# Patient Record
Sex: Female | Born: 1955 | Race: Black or African American | Hispanic: No | Marital: Single | Smoking: Never smoker
Health system: Southern US, Community
[De-identification: ages and names within clinical notes are randomized; demographics above are authoritative.]

## PROBLEM LIST (undated history)

## (undated) DIAGNOSIS — I10 Essential (primary) hypertension: Secondary | ICD-10-CM

## (undated) DIAGNOSIS — E119 Type 2 diabetes mellitus without complications: Secondary | ICD-10-CM

## (undated) HISTORY — PX: ABDOMINAL HYSTERECTOMY: SHX81

---

## 2014-02-24 ENCOUNTER — Encounter (HOSPITAL_COMMUNITY): Payer: Self-pay

## 2014-02-24 ENCOUNTER — Emergency Department (HOSPITAL_COMMUNITY): Payer: Self-pay

## 2014-02-24 ENCOUNTER — Emergency Department (HOSPITAL_COMMUNITY): Payer: MEDICAID

## 2014-02-24 ENCOUNTER — Inpatient Hospital Stay (HOSPITAL_COMMUNITY)
Admission: EM | Admit: 2014-02-24 | Discharge: 2014-03-15 | DRG: 870 | Disposition: A | Discharge status: Home / Self Care | Payer: Self-pay | Attending: Internal Medicine | Admitting: Internal Medicine

## 2014-02-24 ENCOUNTER — Inpatient Hospital Stay (HOSPITAL_COMMUNITY): Payer: MEDICAID

## 2014-02-24 DIAGNOSIS — G9340 Encephalopathy, unspecified: Secondary | ICD-10-CM | POA: Diagnosis present

## 2014-02-24 DIAGNOSIS — E1110 Type 2 diabetes mellitus with ketoacidosis without coma: Secondary | ICD-10-CM | POA: Diagnosis present

## 2014-02-24 DIAGNOSIS — D72829 Elevated white blood cell count, unspecified: Secondary | ICD-10-CM | POA: Clinically undetermined

## 2014-02-24 DIAGNOSIS — J189 Pneumonia, unspecified organism: Secondary | ICD-10-CM | POA: Insufficient documentation

## 2014-02-24 DIAGNOSIS — N184 Chronic kidney disease, stage 4 (severe): Secondary | ICD-10-CM | POA: Insufficient documentation

## 2014-02-24 DIAGNOSIS — F32A Depression, unspecified: Secondary | ICD-10-CM | POA: Clinically undetermined

## 2014-02-24 DIAGNOSIS — N170 Acute kidney failure with tubular necrosis: Secondary | ICD-10-CM | POA: Diagnosis present

## 2014-02-24 DIAGNOSIS — E876 Hypokalemia: Secondary | ICD-10-CM | POA: Insufficient documentation

## 2014-02-24 DIAGNOSIS — Z95828 Presence of other vascular implants and grafts: Secondary | ICD-10-CM

## 2014-02-24 DIAGNOSIS — R4182 Altered mental status, unspecified: Secondary | ICD-10-CM | POA: Diagnosis present

## 2014-02-24 DIAGNOSIS — E0811 Diabetes mellitus due to underlying condition with ketoacidosis with coma: Secondary | ICD-10-CM | POA: Insufficient documentation

## 2014-02-24 DIAGNOSIS — Z01818 Encounter for other preprocedural examination: Secondary | ICD-10-CM

## 2014-02-24 DIAGNOSIS — F329 Major depressive disorder, single episode, unspecified: Secondary | ICD-10-CM | POA: Diagnosis not present

## 2014-02-24 DIAGNOSIS — G7281 Critical illness myopathy: Secondary | ICD-10-CM | POA: Diagnosis not present

## 2014-02-24 DIAGNOSIS — Z87891 Personal history of nicotine dependence: Secondary | ICD-10-CM

## 2014-02-24 DIAGNOSIS — R0989 Other specified symptoms and signs involving the circulatory and respiratory systems: Secondary | ICD-10-CM

## 2014-02-24 DIAGNOSIS — J9601 Acute respiratory failure with hypoxia: Secondary | ICD-10-CM | POA: Diagnosis present

## 2014-02-24 DIAGNOSIS — A419 Sepsis, unspecified organism: Principal | ICD-10-CM | POA: Diagnosis present

## 2014-02-24 DIAGNOSIS — E860 Dehydration: Secondary | ICD-10-CM | POA: Diagnosis present

## 2014-02-24 DIAGNOSIS — N2581 Secondary hyperparathyroidism of renal origin: Secondary | ICD-10-CM | POA: Diagnosis present

## 2014-02-24 DIAGNOSIS — E872 Acidosis, unspecified: Secondary | ICD-10-CM | POA: Diagnosis present

## 2014-02-24 DIAGNOSIS — N179 Acute kidney failure, unspecified: Secondary | ICD-10-CM | POA: Diagnosis present

## 2014-02-24 DIAGNOSIS — E43 Unspecified severe protein-calorie malnutrition: Secondary | ICD-10-CM | POA: Diagnosis present

## 2014-02-24 DIAGNOSIS — J13 Pneumonia due to Streptococcus pneumoniae: Secondary | ICD-10-CM | POA: Diagnosis present

## 2014-02-24 DIAGNOSIS — I129 Hypertensive chronic kidney disease with stage 1 through stage 4 chronic kidney disease, or unspecified chronic kidney disease: Secondary | ICD-10-CM | POA: Diagnosis present

## 2014-02-24 DIAGNOSIS — R6520 Severe sepsis without septic shock: Secondary | ICD-10-CM | POA: Diagnosis present

## 2014-02-24 DIAGNOSIS — D6959 Other secondary thrombocytopenia: Secondary | ICD-10-CM | POA: Diagnosis present

## 2014-02-24 DIAGNOSIS — J9690 Respiratory failure, unspecified, unspecified whether with hypoxia or hypercapnia: Secondary | ICD-10-CM

## 2014-02-24 DIAGNOSIS — N390 Urinary tract infection, site not specified: Secondary | ICD-10-CM | POA: Clinically undetermined

## 2014-02-24 DIAGNOSIS — E669 Obesity, unspecified: Secondary | ICD-10-CM | POA: Diagnosis present

## 2014-02-24 DIAGNOSIS — J690 Pneumonitis due to inhalation of food and vomit: Secondary | ICD-10-CM | POA: Diagnosis present

## 2014-02-24 DIAGNOSIS — E1122 Type 2 diabetes mellitus with diabetic chronic kidney disease: Secondary | ICD-10-CM | POA: Diagnosis present

## 2014-02-24 DIAGNOSIS — R197 Diarrhea, unspecified: Secondary | ICD-10-CM | POA: Diagnosis not present

## 2014-02-24 DIAGNOSIS — J9600 Acute respiratory failure, unspecified whether with hypoxia or hypercapnia: Secondary | ICD-10-CM | POA: Diagnosis present

## 2014-02-24 DIAGNOSIS — T68XXXA Hypothermia, initial encounter: Secondary | ICD-10-CM

## 2014-02-24 DIAGNOSIS — E1310 Other specified diabetes mellitus with ketoacidosis without coma: Secondary | ICD-10-CM | POA: Diagnosis present

## 2014-02-24 DIAGNOSIS — D509 Iron deficiency anemia, unspecified: Secondary | ICD-10-CM | POA: Diagnosis present

## 2014-02-24 HISTORY — DX: Type 2 diabetes mellitus without complications: E11.9

## 2014-02-24 HISTORY — DX: Essential (primary) hypertension: I10

## 2014-02-24 LAB — URINALYSIS, ROUTINE W REFLEX MICROSCOPIC
Glucose, UA: >1000 mg/dL — AB
Leukocytes, UA: NEGATIVE
Nitrite: NEGATIVE
Protein, ur: 100 mg/dL — AB
Specific Gravity, Urine: 1.02 (ref 1.005–1.030)
Urobilinogen, UA: 0.2 mg/dL (ref 0.0–1.0)
pH: 5.5 (ref 5.0–8.0)

## 2014-02-24 LAB — BASIC METABOLIC PANEL
ANION GAP: 29 — AB (ref 5–15)
Anion gap: 13 (ref 5–15)
BUN: 37 mg/dL — ABNORMAL HIGH (ref 6–23)
BUN: 35 mg/dL — AB (ref 6–23)
CHLORIDE: 99 meq/L (ref 96–112)
CHLORIDE: 114 meq/L — AB (ref 96–112)
CO2: 7 mmol/L — AB (ref 19–32)
CO2: 15 mmol/L — AB (ref 19–32)
Calcium: 10.8 mg/dL — ABNORMAL HIGH (ref 8.4–10.5)
Calcium: 9.1 mg/dL (ref 8.4–10.5)
Creatinine, Ser: 1.83 mg/dL — ABNORMAL HIGH (ref 0.50–1.10)
Creatinine, Ser: 1.38 mg/dL — ABNORMAL HIGH (ref 0.50–1.10)
GFR calc Af Amer: 34 mL/min — ABNORMAL LOW (ref >90)
GFR calc non Af Amer: 29 mL/min — ABNORMAL LOW (ref >90)
GFR, EST AFRICAN AMERICAN: 48 mL/min — AB (ref >90)
GFR, EST NON AFRICAN AMERICAN: 41 mL/min — AB (ref >90)
Glucose, Bld: 549 mg/dL — ABNORMAL HIGH (ref 70–99)
Glucose, Bld: 273 mg/dL — ABNORMAL HIGH (ref 70–99)
POTASSIUM: 3.7 mmol/L (ref 3.5–5.1)
Potassium: 3.1 mmol/L — ABNORMAL LOW (ref 3.5–5.1)
SODIUM: 135 mmol/L (ref 135–145)
SODIUM: 142 mmol/L (ref 135–145)

## 2014-02-24 LAB — CBG MONITORING, ED
Glucose-Capillary: 437 mg/dL — ABNORMAL HIGH (ref 70–99)
Glucose-Capillary: 476 mg/dL — ABNORMAL HIGH (ref 70–99)

## 2014-02-24 LAB — GLUCOSE, CAPILLARY
GLUCOSE-CAPILLARY: 365 mg/dL — AB (ref 70–99)
Glucose-Capillary: 395 mg/dL — ABNORMAL HIGH (ref 70–99)
Glucose-Capillary: 248 mg/dL — ABNORMAL HIGH (ref 70–99)
Glucose-Capillary: 229 mg/dL — ABNORMAL HIGH (ref 70–99)
Glucose-Capillary: 239 mg/dL — ABNORMAL HIGH (ref 70–99)
Glucose-Capillary: 224 mg/dL — ABNORMAL HIGH (ref 70–99)
Glucose-Capillary: 223 mg/dL — ABNORMAL HIGH (ref 70–99)

## 2014-02-24 LAB — CBC WITH DIFFERENTIAL/PLATELET
Basophils Absolute: 0.1 10*3/uL (ref 0.0–0.1)
Basophils Relative: 0 % (ref 0–1)
EOS ABS: 0 10*3/uL (ref 0.0–0.7)
Eosinophils Relative: 0 % (ref 0–5)
HCT: 36.7 % (ref 36.0–46.0)
Hemoglobin: 11.8 g/dL — ABNORMAL LOW (ref 12.0–15.0)
LYMPHS ABS: 0.8 10*3/uL (ref 0.7–4.0)
LYMPHS PCT: 2 % — AB (ref 12–46)
MCH: 31 pg (ref 26.0–34.0)
MCHC: 32.2 g/dL (ref 30.0–36.0)
MCV: 96.3 fL (ref 78.0–100.0)
MONO ABS: 1 10*3/uL (ref 0.1–1.0)
Monocytes Relative: 3 % (ref 3–12)
NEUTROS PCT: 94 % — AB (ref 43–77)
Neutro Abs: 31 10*3/uL — ABNORMAL HIGH (ref 1.7–7.7)
Platelets: 323 10*3/uL (ref 150–400)
RBC: 3.81 MIL/uL — ABNORMAL LOW (ref 3.87–5.11)
RDW: 14.3 % (ref 11.5–15.5)
WBC: 32.9 10*3/uL — ABNORMAL HIGH (ref 4.0–10.5)

## 2014-02-24 LAB — ETHANOL: Alcohol, Ethyl (B): <5 mg/dL (ref 0–9)

## 2014-02-24 LAB — BLOOD GAS, ARTERIAL
Acid-base deficit: 22.5 mmol/L — ABNORMAL HIGH (ref 0.0–2.0)
BICARBONATE: 6.4 meq/L — AB (ref 20.0–24.0)
FIO2: 110 %
MECHVT: 450 mL
O2 Saturation: 93.9 %
PEEP/CPAP: 5 cmH2O
PO2 ART: 89.7 mmHg (ref 80.0–100.0)
Patient temperature: 37
RATE: 15 resp/min
TCO2: 6.6 mmol/L (ref 0–100)
pCO2 arterial: 27 mmHg — ABNORMAL LOW (ref 35.0–45.0)
pH, Arterial: 7.007 — CL (ref 7.350–7.450)

## 2014-02-24 LAB — CBC
HCT: 28.8 % — ABNORMAL LOW (ref 36.0–46.0)
Hemoglobin: 9.6 g/dL — ABNORMAL LOW (ref 12.0–15.0)
MCH: 30.7 pg (ref 26.0–34.0)
MCHC: 33.3 g/dL (ref 30.0–36.0)
MCV: 92 fL (ref 78.0–100.0)
Platelets: 268 10*3/uL (ref 150–400)
RBC: 3.13 MIL/uL — ABNORMAL LOW (ref 3.87–5.11)
RDW: 14.1 % (ref 11.5–15.5)
WBC: 23.3 10*3/uL — ABNORMAL HIGH (ref 4.0–10.5)

## 2014-02-24 LAB — RAPID URINE DRUG SCREEN, HOSP PERFORMED
AMPHETAMINES: NOT DETECTED
BENZODIAZEPINES: NOT DETECTED
Barbiturates: NOT DETECTED
COCAINE: NOT DETECTED
Opiates: NOT DETECTED
Tetrahydrocannabinol: POSITIVE — AB

## 2014-02-24 LAB — URINE MICROSCOPIC-ADD ON

## 2014-02-24 LAB — STREP PNEUMONIAE URINARY ANTIGEN: Strep Pneumo Urinary Antigen: NEGATIVE

## 2014-02-24 LAB — CK: CK TOTAL: 102 U/L (ref 7–177)

## 2014-02-24 LAB — LACTIC ACID, PLASMA: Lactic Acid, Venous: 1.8 mmol/L (ref 0.5–2.2)

## 2014-02-24 LAB — INFLUENZA PANEL BY PCR (TYPE A & B)
H1N1 flu by pcr: NOT DETECTED
INFLAPCR: NEGATIVE
INFLBPCR: NEGATIVE

## 2014-02-24 LAB — MRSA PCR SCREENING: MRSA by PCR: NEGATIVE

## 2014-02-24 LAB — KETONES, QUALITATIVE

## 2014-02-24 LAB — TROPONIN I

## 2014-02-24 MED ORDER — CETYLPYRIDINIUM CHLORIDE 0.05 % MT LIQD
7.0000 mL | Freq: Four times a day (QID) | OROMUCOSAL | Status: DC
  Administered 2014-02-25 – 2014-03-14 (×50): 7 mL via OROMUCOSAL

## 2014-02-24 MED ORDER — ETOMIDATE 2 MG/ML IV SOLN
15.0000 mg | Freq: Once | INTRAVENOUS | Status: AC
  Administered 2014-02-24: 15 mg via INTRAVENOUS

## 2014-02-24 MED ORDER — SODIUM CHLORIDE 0.9 % IJ SOLN
10.0000 mL | INTRAMUSCULAR | Status: DC | PRN
  Administered 2014-03-01 – 2014-03-13 (×8): 10 mL
  Filled 2014-02-24 (×8): qty 40

## 2014-02-24 MED ORDER — CEFTRIAXONE SODIUM 1 G IJ SOLR
INTRAMUSCULAR | Status: AC
  Filled 2014-02-24: qty 10

## 2014-02-24 MED ORDER — SODIUM CHLORIDE 0.9 % IV SOLN
INTRAVENOUS | Status: DC
  Administered 2014-02-24: 18:00:00 via INTRAVENOUS

## 2014-02-24 MED ORDER — FENTANYL CITRATE 0.05 MG/ML IJ SOLN: 100.0000 ug | INTRAMUSCULAR | Status: DC | PRN

## 2014-02-24 MED ORDER — ONDANSETRON HCL 4 MG/2ML IJ SOLN: 4.0000 mg | Freq: Four times a day (QID) | INTRAMUSCULAR | Status: DC | PRN

## 2014-02-24 MED ORDER — SODIUM CHLORIDE 0.9 % IV SOLN
25.0000 ug/h | INTRAVENOUS | Status: DC
  Administered 2014-02-24 – 2014-02-25 (×2): 25 ug/h via INTRAVENOUS
  Administered 2014-02-25: 200 ug/h via INTRAVENOUS
  Administered 2014-02-26 – 2014-02-27 (×4): 250 ug/h via INTRAVENOUS
  Administered 2014-02-28: 200 ug/h via INTRAVENOUS
  Filled 2014-02-24 (×7): qty 50

## 2014-02-24 MED ORDER — SODIUM CHLORIDE 0.9 % IV SOLN: INTRAVENOUS | Status: AC

## 2014-02-24 MED ORDER — ETOMIDATE 2 MG/ML IV SOLN
INTRAVENOUS | Status: AC
  Filled 2014-02-24: qty 20

## 2014-02-24 MED ORDER — ARTIFICIAL TEARS OP OINT
TOPICAL_OINTMENT | OPHTHALMIC | Status: DC | PRN
  Administered 2014-02-25: via OPHTHALMIC
  Administered 2014-02-26: 1 via OPHTHALMIC
  Filled 2014-02-24: qty 3.5

## 2014-02-24 MED ORDER — SODIUM CHLORIDE 0.9 % IJ SOLN
10.0000 mL | Freq: Two times a day (BID) | INTRAMUSCULAR | Status: DC
  Administered 2014-02-24 – 2014-02-26 (×4): 10 mL
  Administered 2014-02-27: 40 mL
  Administered 2014-02-28: 10 mL
  Administered 2014-03-01: 30 mL
  Administered 2014-03-02: 10 mL

## 2014-02-24 MED ORDER — SUCCINYLCHOLINE CHLORIDE 20 MG/ML IJ SOLN
100.0000 mg | Freq: Once | INTRAMUSCULAR | Status: AC
  Administered 2014-02-24: 100 mg via INTRAVENOUS

## 2014-02-24 MED ORDER — SUCCINYLCHOLINE CHLORIDE 20 MG/ML IJ SOLN
INTRAMUSCULAR | Status: AC
  Filled 2014-02-24: qty 1

## 2014-02-24 MED ORDER — ASPIRIN 300 MG RE SUPP
300.0000 mg | RECTAL | Status: AC
  Administered 2014-02-24: 300 mg via RECTAL
  Filled 2014-02-24: qty 1

## 2014-02-24 MED ORDER — SODIUM CHLORIDE 0.9 % IV SOLN: 250.0000 mL | INTRAVENOUS | Status: DC | PRN

## 2014-02-24 MED ORDER — PROPOFOL 10 MG/ML IV EMUL
5.0000 ug/kg/min | INTRAVENOUS | Status: DC
  Administered 2014-02-24: 5 ug/kg/min via INTRAVENOUS
  Filled 2014-02-24: qty 100

## 2014-02-24 MED ORDER — DEXTROSE-NACL 5-0.45 % IV SOLN: INTRAVENOUS | Status: DC

## 2014-02-24 MED ORDER — SODIUM CHLORIDE 0.9 % IV BOLUS (SEPSIS)
1000.0000 mL | Freq: Once | INTRAVENOUS | Status: AC
  Administered 2014-02-24: 1000 mL via INTRAVENOUS

## 2014-02-24 MED ORDER — VANCOMYCIN HCL IN DEXTROSE 1-5 GM/200ML-% IV SOLN
1000.0000 mg | INTRAVENOUS | Status: DC
  Administered 2014-02-24 – 2014-02-25 (×2): 1000 mg via INTRAVENOUS
  Filled 2014-02-24 (×3): qty 200

## 2014-02-24 MED ORDER — ALBUTEROL SULFATE (2.5 MG/3ML) 0.083% IN NEBU: 2.5000 mg | INHALATION_SOLUTION | RESPIRATORY_TRACT | Status: DC | PRN

## 2014-02-24 MED ORDER — INSULIN REGULAR HUMAN 100 UNIT/ML IJ SOLN
INTRAMUSCULAR | Status: DC
  Filled 2014-02-24 (×2): qty 2.5

## 2014-02-24 MED ORDER — ALBUTEROL SULFATE (2.5 MG/3ML) 0.083% IN NEBU: 2.5000 mg | INHALATION_SOLUTION | RESPIRATORY_TRACT | Status: DC

## 2014-02-24 MED ORDER — DEXTROSE 5 % IV SOLN
500.0000 mg | Freq: Once | INTRAVENOUS | Status: AC
  Administered 2014-02-24: 500 mg via INTRAVENOUS
  Filled 2014-02-24: qty 500

## 2014-02-24 MED ORDER — IPRATROPIUM BROMIDE 0.02 % IN SOLN: 0.5000 mg | RESPIRATORY_TRACT | Status: DC

## 2014-02-24 MED ORDER — IPRATROPIUM-ALBUTEROL 0.5-2.5 (3) MG/3ML IN SOLN
3.0000 mL | RESPIRATORY_TRACT | Status: DC
  Administered 2014-02-24 – 2014-02-27 (×18): 3 mL via RESPIRATORY_TRACT
  Filled 2014-02-24 (×18): qty 3

## 2014-02-24 MED ORDER — PIPERACILLIN-TAZOBACTAM 3.375 G IVPB
3.3750 g | Freq: Three times a day (TID) | INTRAVENOUS | Status: DC
  Administered 2014-02-24 – 2014-02-26 (×8): 3.375 g via INTRAVENOUS
  Filled 2014-02-24 (×9): qty 50

## 2014-02-24 MED ORDER — PIPERACILLIN-TAZOBACTAM 3.375 G IVPB
INTRAVENOUS | Status: AC
  Filled 2014-02-24: qty 100

## 2014-02-24 MED ORDER — VANCOMYCIN HCL IN DEXTROSE 1-5 GM/200ML-% IV SOLN
INTRAVENOUS | Status: AC
  Filled 2014-02-24: qty 200

## 2014-02-24 MED ORDER — ACETAMINOPHEN 325 MG PO TABS: 650.0000 mg | ORAL_TABLET | ORAL | Status: DC | PRN

## 2014-02-24 MED ORDER — ROCURONIUM BROMIDE 50 MG/5ML IV SOLN
INTRAVENOUS | Status: AC
  Filled 2014-02-24: qty 2

## 2014-02-24 MED ORDER — LIDOCAINE HCL (CARDIAC) 20 MG/ML IV SOLN
INTRAVENOUS | Status: AC
  Filled 2014-02-24: qty 5

## 2014-02-24 MED ORDER — PANTOPRAZOLE SODIUM 40 MG IV SOLR
40.0000 mg | Freq: Every day | INTRAVENOUS | Status: DC
  Administered 2014-02-24 – 2014-02-26 (×3): 40 mg via INTRAVENOUS
  Filled 2014-02-24 (×3): qty 40

## 2014-02-24 MED ORDER — FENTANYL CITRATE 0.05 MG/ML IJ SOLN
100.0000 ug | INTRAMUSCULAR | Status: AC | PRN
  Administered 2014-02-24 (×3): 100 ug via INTRAVENOUS
  Filled 2014-02-24 (×4): qty 2

## 2014-02-24 MED ORDER — SODIUM CHLORIDE 0.9 % IV SOLN
INTRAVENOUS | Status: DC
  Administered 2014-02-24: 3.8 [IU]/h via INTRAVENOUS
  Filled 2014-02-24: qty 2.5

## 2014-02-24 MED ORDER — ARTIFICIAL TEARS OP OINT
TOPICAL_OINTMENT | OPHTHALMIC | Status: AC
  Filled 2014-02-24: qty 3.5

## 2014-02-24 MED ORDER — ASPIRIN 81 MG PO CHEW: 324.0000 mg | CHEWABLE_TABLET | ORAL | Status: AC

## 2014-02-24 MED ORDER — HEPARIN SODIUM (PORCINE) 5000 UNIT/ML IJ SOLN
5000.0000 [IU] | Freq: Three times a day (TID) | INTRAMUSCULAR | Status: DC
  Administered 2014-02-24 – 2014-02-27 (×9): 5000 [IU] via SUBCUTANEOUS
  Filled 2014-02-24 (×9): qty 1

## 2014-02-24 MED ORDER — DEXTROSE 5 % IV SOLN
1.0000 g | Freq: Once | INTRAVENOUS | Status: AC
  Administered 2014-02-24: 1 g via INTRAVENOUS
  Filled 2014-02-24: qty 10

## 2014-02-24 MED ORDER — POTASSIUM CHLORIDE 10 MEQ/100ML IV SOLN
10.0000 meq | INTRAVENOUS | Status: AC
  Administered 2014-02-24 (×2): 10 meq via INTRAVENOUS
  Filled 2014-02-24: qty 100

## 2014-02-24 MED ORDER — CHLORHEXIDINE GLUCONATE 0.12 % MT SOLN
15.0000 mL | Freq: Two times a day (BID) | OROMUCOSAL | Status: DC
  Administered 2014-02-24 – 2014-03-15 (×35): 15 mL via OROMUCOSAL
  Filled 2014-02-24 (×40): qty 15

## 2014-02-24 MED ORDER — DEXTROSE-NACL 5-0.45 % IV SOLN
INTRAVENOUS | Status: DC
  Administered 2014-02-24 – 2014-02-26 (×5): via INTRAVENOUS

## 2014-02-24 NOTE — ED Notes (Signed)
EMS called out for unresponsiveness, found on bedroom floor by brother; last seen normal yesterday, CBG on arrival 390;

## 2014-02-24 NOTE — ED Provider Notes (Addendum)
CSN: [id number]     Arrival date & time 02/24/14  1016 History   First MD Initiated Contact with Patient 02/24/14 1021     Chief Complaint  Patient presents with  . unresponsive      (Consider location/radiation/quality/duration/timing/severity/associated sxs/prior Treatment) HPI....... level V caveat for urgent need for intervention. Patient was found at her home today by her brother on the floor. She was obtunded and acting confused. Brother reports that yesterday she didn't feel well and was coughing. Family claims that she has no previous health history. Patient arrived by EMS. She was totally obtunded on initial exam and I was unable to get history from patient  No past medical history on file. No past surgical history on file. No family history on file. History  Substance Use Topics  . Smoking status: Not on file  . Smokeless tobacco: Not on file  . Alcohol Use: Not on file   OB History    No data available     Review of Systems  Unable to perform ROS: Acuity of condition      Allergies  Review of patient's allergies indicates not on file.  Home Medications   Prior to Admission medications   Not on File   BP 130/81 mmHg  Pulse 101  Temp(Src) 94.2 F (34.6 C) (Core (Comment))  Resp 26  Ht 5\' 6"  (1.676 m)  Wt 135 lb (61.236 kg)  BMI 21.80 kg/m2  SpO2 94%  LMP  Physical Exam  Constitutional: She is oriented to person, place, and time.  Obtunded, dehydrated, crusty phlegm around mouth, slightly tachypneic, acetone on breath  HENT:  Head: Normocephalic and atraumatic.  Eyes: Conjunctivae are normal. Pupils are equal, round, and reactive to light.  Neck: Normal range of motion. Neck supple.  Cardiovascular: Normal rate and regular rhythm.   Pulmonary/Chest: Effort normal and breath sounds normal.  Abdominal: Soft. Bowel sounds are normal.  Genitourinary:  Suspected candida in vulva/vaginal area  Musculoskeletal: Normal range of motion.  Neurological: She  is alert and oriented to person, place, and time.  Skin: Skin is warm and dry.  Psychiatric: She has a normal mood and affect. Her behavior is normal.  Nursing note and vitals reviewed.   ED Course  INTUBATION Date/Time: 02/24/2014 1:05 PM Performed by: Donnetta HutchingOOK, Dianna Ewald Authorized by: Donnetta HutchingOOK, Dian Laprade Comments: Reason for intubation: Hypoxemia, inability to protect airway.   Patient intubated using the RSI technique with etomidate 15 mg and succinylcholine 100 mg. Patient intubated with 7.5 endotracheal tube without complications. Good color change on CO2 monitor. Pulse ox rose to 100%. Chest x-ray pending at time of dictation.   (including critical care time) Labs Review Labs Reviewed  BASIC METABOLIC PANEL - Abnormal; Notable for the following:    CO2 7 (*)    Glucose, Bld 549 (*)    BUN 37 (*)    Creatinine, Ser 1.83 (*)    Calcium 10.8 (*)    GFR calc non Af Amer 29 (*)    GFR calc Af Amer 34 (*)    Anion gap 29 (*)    All other components within normal limits  CBC WITH DIFFERENTIAL - Abnormal; Notable for the following:    WBC 32.9 (*)    RBC 3.81 (*)    Hemoglobin 11.8 (*)    Neutrophils Relative % 94 (*)    Neutro Abs 31.0 (*)    Lymphocytes Relative 2 (*)    All other components within normal limits  URINE RAPID DRUG  SCREEN (HOSP PERFORMED) - Abnormal; Notable for the following:    Tetrahydrocannabinol POSITIVE (*)    All other components within normal limits  URINALYSIS, ROUTINE W REFLEX MICROSCOPIC - Abnormal; Notable for the following:    APPearance CLOUDY (*)    Glucose, UA >1000 (*)    Hgb urine dipstick LARGE (*)    Bilirubin Urine SMALL (*)    Ketones, ur >80 (*)    Protein, ur 100 (*)    All other components within normal limits  URINE MICROSCOPIC-ADD ON - Abnormal; Notable for the following:    Bacteria, UA MANY (*)    Casts GRANULAR CAST (*)    All other components within normal limits  TROPONIN I  ETHANOL  CK  CBG MONITORING, ED    Imaging Review Ct  Head Wo Contrast  02/24/2014   CLINICAL DATA:  Found on bedroom floor unresponsive; ams  EXAM: CT HEAD WITHOUT CONTRAST  TECHNIQUE: Contiguous axial images were obtained from the base of the skull through the vertex without intravenous contrast.  COMPARISON:  None.  FINDINGS: Diffusely enlarged ventricles and subarachnoid spaces. Patchy white matter low density in both cerebral hemispheres. No skull fracture, intracranial hemorrhage, mass lesion, CT evidence of acute infarction or paranasal sinus air-fluid levels.  IMPRESSION: No acute abnormality. Mild diffuse cerebral and cerebellar atrophy and minimal chronic small vessel white matter ischemic changes in both cerebral hemispheres.   Electronically Signed   By: Gordan Payment M.D.   On: 02/24/2014 11:28   Ct Cervical Spine Wo Contrast  02/24/2014   CLINICAL DATA:  Patient found down and unresponsive.  EXAM: CT CERVICAL SPINE WITHOUT CONTRAST  TECHNIQUE: Multidetector CT imaging of the cervical spine was performed without intravenous contrast. Multiplanar CT image reconstructions were also generated.  COMPARISON:  None.  FINDINGS: There is mild reversal of the normal cervical lordosis. No fracture or malalignment is identified. 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 Lung apices are clear. Gas in soft tissue structures in the upper chest is likely related to vascular access.  IMPRESSION: No acute abnormality.  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   Electronically Signed   By: Drusilla Kanner M.D.   On: 02/24/2014 12:56   Dg Chest Port 1 View  02/24/2014   CLINICAL DATA:  Found on bedroom floor unresponsive; ams  EXAM: PORTABLE CHEST - 1 VIEW  COMPARISON:  None.  FINDINGS: Enlarged cardiac silhouette. Extensive patchy opacity in both lungs. Mildly  prominent interstitial markings. No pleural fluid seen. Unremarkable bones.  IMPRESSION: 1. Extensive bilateral airspace opacity, most likely due to pneumonia or aspiration pneumonitis. This does not have the typical appearance of pulmonary edema. Follow-up to resolution is recommended to exclude any underlying masses. 2. Cardiomegaly and probable chronic interstitial lung disease.   Electronically Signed   By: Gordan Payment M.D.   On: 02/24/2014 11:27     EKG Interpretation None     CRITICAL CARE Performed by: Donnetta Hutching  ?  Total critical care time: 75 min  Critical care time was exclusive of separately billable procedures and treating other patients.  Critical care was necessary to treat or prevent imminent or life-threatening deterioration.  Critical care was time spent personally by me on the following activities: development of treatment plan with patient and/or surrogate as well as nursing,  discussions with consultants, evaluation of patient's response to treatment, examination of patient, obtaining history from patient or surrogate, ordering and performing treatments and interventions, ordering and review of laboratory studies, ordering and review of radiographic studies, pulse oximetry and re-evaluation of patient's condition. MDM   Final diagnoses:  Altered mental status  Altered mental state  Hypothermia, initial encounter  Diabetic ketoacidosis with coma associated with diabetes mellitus due to underlying condition  Community acquired pneumonia    Patient is in critical condition. She is hypothermic, in DKA, with evidence of bilat pneumonia on chest x-ray. We initiated aggressive warming via the bear hugger, aggressive IV hydration, glucose stabilizer protocol; IV Rocephin, IV Zithromax for community-acquired pneumonia. Patient was intubated to protect her airway.  Tests and clinical scenario were discussed with family in great detail. Admit to Dr. Audie Clear,  MD 02/24/14 1327  Donnetta Hutching, MD 02/26/14 2145

## 2014-02-24 NOTE — ED Notes (Signed)
CRITICAL VALUE ALERT  Critical value received:  CO2 = 7  Date of notification:  02/24/14  Time of notification:  1124  Critical value read back:Yes.    Nurse who received alert:  S. Pernell Dikes RN  MD notified (1st page):  Dr. Adriana Simasook  Time of first page:  1124  MD notified (2nd page):  Time of second page:  Responding MD:  Dr. Adriana Simasook  Time MD responded:  1124

## 2014-02-24 NOTE — Progress Notes (Addendum)
Pt transferred to unit from ED with pna, DKA, and respiratory failure. Pt is currently intubated and having PICC line placed at bedside. VS are stable. Pt's family is waiting in family room and has been updated on pt's status. Will continue to monitor.

## 2014-02-24 NOTE — H&P (Signed)
[facility] History and Physical  Jackie Fisher ZOX:[id number] DOB: [birth year] DOA: 02/24/2014  Referring physician: Dr. Adriana Simas, ER physician PCP: No primary care provider on file.   Chief Complaint: Altered mental status  HPI: Jackie Fisher is a 59 y.o. female was brought to the emergency room today when she was found unresponsive at home. Patient was found by family member lying on the floor. It is unknown how long she was lying on the floor. The family members who are currently in the room cannot really provide any meaningful history since they live out of town and were unaware of the circumstances that led to the patient's admission. Patient was last seen yesterday when she was visited by family member. One of her sisters poked her over the phone in the evening yesterday and felt that the patient was disengaged, did not say much, but was able to carry on a conversation. When she was brought to the emergency room after being found unresponsive this morning, she was noted to be hypothermic with a temperature of 93.5. She was also noted to have chest x-ray findings consistent with pneumonia, elevated blood glucose and a severe metabolic acidosis. Her course in the emergency room was complicated by development of tachypnea and respiratory distress requiring intubation. She is being admitted to ICU for further management.   Review of Systems:  Unable to assess due to mental status  No past medical history on file. No past surgical history on file. Social History:  has no tobacco, alcohol, and drug history on file.  Allergies not on file  Family history: Unable to assess due to mental status   Prior to Admission medications   Not on File   Physical Exam: Filed Vitals:   02/24/14 1400 02/24/14 1420 02/24/14 1430 02/24/14 1448  BP: 124/68  125/68   Pulse: 108  109   Temp:  97.9 F (36.6 C)  98.4 F (36.9 C)  TempSrc:  Core (Comment)  Core (Comment)  Resp: 29  29   Height:        Weight:      SpO2: 100%  100%     Wt Readings from Last 3 Encounters:  02/24/14 61.236 kg (135 lb)    General:  Intubated and sedated on the ventilator Eyes: PERRL, normal lids, irises & conjunctiva ENT: ETT in place, mucous membranes appear dry Neck: no LAD, masses or thyromegaly Cardiovascular: S1, S2, tachycardic, no m/r/g. No LE edema. Telemetry: SR, no arrhythmias  Respiratory: CTA bilaterally, no w/r/r. Normal respiratory effort. Abdomen: soft, ntnd Skin: no rash or induration seen on limited exam Musculoskeletal: grossly normal tone BUE/BLE Psychiatric: Unable to assess since patient is intubated Neurologic: Unable to assess .          Labs on Admission:  Basic Metabolic Panel:  Recent Labs Lab 02/24/14 1029  NA 135  K 3.7  CL 99  CO2 7*  GLUCOSE 549*  BUN 37*  CREATININE 1.83*  CALCIUM 10.8*   Liver Function Tests: No results for input(s): AST, ALT, ALKPHOS, BILITOT, PROT, ALBUMIN in the last 168 hours. No results for input(s): LIPASE, AMYLASE in the last 168 hours. No results for input(s): AMMONIA in the last 168 hours. CBC:  Recent Labs Lab 02/24/14 1029  WBC 32.9*  NEUTROABS 31.0*  HGB 11.8*  HCT 36.7  MCV 96.3  PLT 323   Cardiac Enzymes:  Recent Labs Lab 02/24/14 1029  CKTOTAL 102  TROPONINI <0.03    BNP (last 3 results)  No results for input(s): PROBNP in the last 8760 hours. CBG:  Recent Labs Lab 02/24/14 1330  GLUCAP 437*    Radiological Exams on Admission: Ct Head Wo Contrast  02/24/2014   CLINICAL DATA:  Found on bedroom floor unresponsive; ams  EXAM: CT HEAD WITHOUT CONTRAST  TECHNIQUE: Contiguous axial images were obtained from the base of the skull through the vertex without intravenous contrast.  COMPARISON:  None.  FINDINGS: Diffusely enlarged ventricles and subarachnoid spaces. Patchy white matter low density in both cerebral hemispheres. No skull fracture, intracranial hemorrhage, mass lesion, CT evidence of acute  infarction or paranasal sinus air-fluid levels.  IMPRESSION: No acute abnormality. Mild diffuse cerebral and cerebellar atrophy and minimal chronic small vessel white matter ischemic changes in both cerebral hemispheres.   Electronically Signed   By: Gordan Payment M.D.   On: 02/24/2014 11:28   Ct Cervical Spine Wo Contrast  02/24/2014   CLINICAL DATA:  Patient found down and unresponsive.  EXAM: CT CERVICAL SPINE WITHOUT CONTRAST  TECHNIQUE: Multidetector CT imaging of the cervical spine was performed without intravenous contrast. Multiplanar CT image reconstructions were also generated.  COMPARISON:  None.  FINDINGS: There is mild reversal of the normal cervical lordosis. No fracture or malalignment is identified. 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 Lung apices are clear. Gas in soft tissue structures in the upper chest is likely related to vascular access.  IMPRESSION: No acute abnormality.  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   Electronically Signed   By: Drusilla Kanner M.D.   On: 02/24/2014 12:56   Dg Chest Port 1 View  02/24/2014   CLINICAL DATA:  Found on bedroom floor unresponsive; ams  EXAM: PORTABLE CHEST - 1 VIEW  COMPARISON:  None.  FINDINGS: Enlarged cardiac silhouette. Extensive patchy opacity in both lungs. Mildly prominent interstitial markings. No pleural fluid seen. Unremarkable bones.  IMPRESSION: 1. Extensive bilateral airspace opacity, most likely due to pneumonia or aspiration pneumonitis. This does not have the typical appearance of pulmonary edema. Follow-up to resolution is recommended to exclude any underlying masses. 2. Cardiomegaly and probable chronic interstitial lung disease.   Electronically Signed   By: Gordan Payment M.D.   On: 02/24/2014 11:27   Dg Chest Port 1v Same  Day  02/24/2014   CLINICAL DATA:  ET tube placement  EXAM: PORTABLE CHEST - 1 VIEW SAME DAY  COMPARISON:  02/24/2014  FINDINGS: Endotracheal tube is 1.6 cm above the carina. NG tube enters the stomach. Bilateral lower lobe airspace opacities as well as left upper lobe opacity again noted, unchanged. Heart is borderline in size.  IMPRESSION: Endotracheal tube 1.6 cm above the carina.  Bilateral airspace opacities are stable.   Electronically Signed   By: Charlett Nose M.D.   On: 02/24/2014 14:05    EKG: Independently reviewed. No acute changes  Assessment/Plan Active Problems:   Acute encephalopathy   DKA (diabetic ketoacidoses)   Acute respiratory failure with hypoxia   Acute renal failure   Sepsis with acute organ dysfunction   Aspiration pneumonia   Metabolic acidosis   Sepsis   1. Sepsis. Etiology is likely pneumonia. Patient will be continued on IV fluids and antibiotics. We'll start the patient on vancomycin and Zosyn. Check blood cultures. Check lactic acid. She'll monitor in the ICU. 2.  Diabetic ketoacidosis. Patient's family is unaware for any underlying history of diabetes. Will treat with DKA protocol including IV fluids and insulin infusion. Check hemoglobin A1c. 3. Aspiration pneumonia, possibly developed after she became unresponsive. Continue antibiotics 4. Acute respiratory failure requiring intubation and mechanical ventilation. Will because pulmonology for further assistance. 5. Acute renal failure. Likely related to dehydration. Continue IV hydration and recheck labs in the morning. Follow urine output. If no significant improvement, consider further workup. 6. Acute encephalopathy, related to severe sepsis and dehydration. Patient likely had high blood sugars, went into diabetic ketoacidosis, severe dehydration and became unresponsive. We'll recheck once the patient has off sedation. CT scan of the head did not show any acute findings. We'll have to reassess her neurologic  exam once she is awake.    Code Status: full code DVT Prophylaxis: heparin sq Family Communication: discussed with multiple family members at the bedside Disposition Plan: admit to ICU. [facility]. I have offered to speak [facility] critical care physicians at [facility]. Family wishes to [facility] the [facility] at this time, but if her condition deteriorates, they have [facility].    Time spent: Critical care: 60mins  [facility]ON,Mikelle Myrick [facility] Pager [phone]

## 2014-02-24 NOTE — ED Notes (Signed)
Dr. Kerry HoughMemon paged at [phone]; [phone]

## 2014-02-24 NOTE — ED Notes (Signed)
Critical respiratory values given to Dr. Adriana Simasook.

## 2014-02-24 NOTE — Progress Notes (Signed)
ANTIBIOTIC CONSULT NOTE  Pharmacy Consult for Vancomycin and Zosyn  Indication: pneumonia and rule out sepsis  Allergies not on file  Patient Measurements: Height: 5\' 6"  (167.6 cm) Weight: 135 lb (61.236 kg) IBW/kg (Calculated) : 59.3  Vital Signs: Temp: 98.8 F (37.1 C) (01/17 1517) Temp Source: Core (Comment) (01/17 1517) BP: 117/68 mmHg (01/17 1500) Pulse Rate: 107 (01/17 1500) Intake/Output from previous day:   Intake/Output from this shift: Total I/O In: -  Out: 1800 [Urine:1800]  Labs:  Recent Labs  02/24/14 1029  WBC 32.9*  HGB 11.8*  PLT 323  CREATININE 1.83*   Estimated Creatinine Clearance: 31.4 mL/min (by C-G formula based on Cr of 1.83). No results for input(s): VANCOTROUGH, VANCOPEAK, VANCORANDOM, GENTTROUGH, GENTPEAK, GENTRANDOM, TOBRATROUGH, TOBRAPEAK, TOBRARND, AMIKACINPEAK, AMIKACINTROU, AMIKACIN in the last 72 hours.   Microbiology: No results found for this or any previous visit (from the past 720 hour(s)).  Anti-infectives    Start     Dose/Rate Route Frequency Ordered Stop   02/24/14 1215  cefTRIAXone (ROCEPHIN) 1 g in dextrose 5 % 50 mL IVPB     1 g100 mL/hr over 30 Minutes Intravenous  Once 02/24/14 1208 02/24/14 1416   02/24/14 1215  azithromycin (ZITHROMAX) 500 mg in dextrose 5 % 250 mL IVPB     500 mg250 mL/hr over 60 Minutes Intravenous  Once 02/24/14 1208 02/24/14 1318     Assessment: Okay for Protocol, Found unresponsive at home, potential aspiration PNA/Sepsis.  DKA.  Goal of Therapy:  Vancomycin trough level 15-20 mcg/ml  Plan:  Zosyn 3.375gm IV every 8 hours. Follow-up micro data, labs, vitals.  Vancomycin 1gm IV every 23 hours. Measure antibiotic drug levels at steady state Follow up culture results  Mady GemmaHayes, Nallely Yost R 02/24/2014,3:55 PM

## 2014-02-24 NOTE — Progress Notes (Signed)
eLink Physician-Brief Progress Note Patient Name: Rhett BannisterLula D Wieczorek DOB: 1956/01/03 MRN: [id number]   Date of Service  02/24/2014  HPI/Events of Note  59 yo with new onset DKA, found unresponsive this morning, down time unknown.  Now with possible aspiration pneumonia, metabolic acidosis and sepsis  eICU Interventions  - cont with DKA protocol, monitor LA - cont with antibiotics and IVFs - recheck ABG     Intervention Category Evaluation Type: New Patient Evaluation  Latrisa Hellums 02/24/2014, 4:09 PM

## 2014-02-24 NOTE — ED Notes (Signed)
bair hugger applied.

## 2014-02-24 NOTE — Progress Notes (Signed)
Peripherally Inserted Central Catheter/Midline Placement  The IV Nurse has discussed with the patient and/or persons authorized to consent for the patient, the purpose of this procedure and the potential benefits and risks involved with this procedure.  The benefits include less needle sticks, lab draws from the catheter and patient may be discharged home with the catheter.  Risks include, but not limited to, infection, bleeding, blood clot (thrombus formation), and puncture of an artery; nerve damage and irregular heat beat.  Alternatives to this procedure were also discussed.  PICC/Midline Placement Documentation  PICC Triple Lumen 02/24/14 PICC Right Basilic 44 cm 1 cm (Active)       Daleen SquibbGibson, Marvin Grabill Lynn 02/24/2014, 5:59 PM

## 2014-02-24 NOTE — ED Notes (Signed)
MD at bedside. 

## 2014-02-25 ENCOUNTER — Inpatient Hospital Stay (HOSPITAL_COMMUNITY): Payer: Self-pay

## 2014-02-25 ENCOUNTER — Encounter (HOSPITAL_COMMUNITY): Payer: Self-pay | Admitting: Radiology

## 2014-02-25 DIAGNOSIS — E1311 Other specified diabetes mellitus with ketoacidosis with coma: Secondary | ICD-10-CM

## 2014-02-25 LAB — BASIC METABOLIC PANEL
ANION GAP: 8 (ref 5–15)
ANION GAP: 6 (ref 5–15)
Anion gap: 8 (ref 5–15)
Anion gap: 8 (ref 5–15)
BUN: 36 mg/dL — ABNORMAL HIGH (ref 6–23)
BUN: 39 mg/dL — AB (ref 6–23)
BUN: 40 mg/dL — AB (ref 6–23)
BUN: 43 mg/dL — ABNORMAL HIGH (ref 6–23)
CALCIUM: 8.8 mg/dL (ref 8.4–10.5)
CALCIUM: 8.9 mg/dL (ref 8.4–10.5)
CALCIUM: 9.2 mg/dL (ref 8.4–10.5)
CHLORIDE: 113 meq/L — AB (ref 96–112)
CO2: 17 mmol/L — ABNORMAL LOW (ref 19–32)
CO2: 18 mmol/L — AB (ref 19–32)
CO2: 18 mmol/L — AB (ref 19–32)
CO2: 19 mmol/L (ref 19–32)
CREATININE: 1.44 mg/dL — AB (ref 0.50–1.10)
CREATININE: 1.98 mg/dL — AB (ref 0.50–1.10)
Calcium: 9.1 mg/dL (ref 8.4–10.5)
Chloride: 113 mEq/L — ABNORMAL HIGH (ref 96–112)
Chloride: 114 mEq/L — ABNORMAL HIGH (ref 96–112)
Chloride: 114 mEq/L — ABNORMAL HIGH (ref 96–112)
Creatinine, Ser: 1.73 mg/dL — ABNORMAL HIGH (ref 0.50–1.10)
Creatinine, Ser: 2.25 mg/dL — ABNORMAL HIGH (ref 0.50–1.10)
GFR calc Af Amer: 45 mL/min — ABNORMAL LOW (ref >90)
GFR calc non Af Amer: 23 mL/min — ABNORMAL LOW (ref >90)
GFR calc non Af Amer: 27 mL/min — ABNORMAL LOW (ref >90)
GFR calc non Af Amer: 39 mL/min — ABNORMAL LOW (ref >90)
GFR, EST AFRICAN AMERICAN: 36 mL/min — AB (ref >90)
GFR, EST AFRICAN AMERICAN: 31 mL/min — AB (ref >90)
GFR, EST AFRICAN AMERICAN: 26 mL/min — AB (ref >90)
GFR, EST NON AFRICAN AMERICAN: 31 mL/min — AB (ref >90)
GLUCOSE: 146 mg/dL — AB (ref 70–99)
Glucose, Bld: 138 mg/dL — ABNORMAL HIGH (ref 70–99)
Glucose, Bld: 157 mg/dL — ABNORMAL HIGH (ref 70–99)
Glucose, Bld: 221 mg/dL — ABNORMAL HIGH (ref 70–99)
POTASSIUM: 3.8 mmol/L (ref 3.5–5.1)
Potassium: 3.1 mmol/L — ABNORMAL LOW (ref 3.5–5.1)
Potassium: 3.6 mmol/L (ref 3.5–5.1)
Potassium: 3.6 mmol/L (ref 3.5–5.1)
SODIUM: 139 mmol/L (ref 135–145)
Sodium: 138 mmol/L (ref 135–145)
Sodium: 139 mmol/L (ref 135–145)
Sodium: 140 mmol/L (ref 135–145)

## 2014-02-25 LAB — BLOOD GAS, ARTERIAL
Acid-Base Excess: 9 mmol/L — ABNORMAL HIGH (ref 0.0–2.0)
Acid-base deficit: 9.1 mmol/L — ABNORMAL HIGH (ref 0.0–2.0)
BICARBONATE: 16.5 meq/L — AB (ref 20.0–24.0)
Drawn by: 317771
FIO2: 0.4 %
MECHVT: 450 mL
O2 Saturation: 91.2 %
PEEP: 5 cmH2O
PH ART: 7.271 — AB (ref 7.350–7.450)
RATE: 15 resp/min
TCO2: 15.9 mmol/L (ref 0–100)
pCO2 arterial: 37.2 mmHg (ref 35.0–45.0)
pO2, Arterial: 61.3 mmHg — ABNORMAL LOW (ref 80.0–100.0)

## 2014-02-25 LAB — CBC
HCT: 27.6 % — ABNORMAL LOW (ref 36.0–46.0)
Hemoglobin: 9.4 g/dL — ABNORMAL LOW (ref 12.0–15.0)
MCH: 30.3 pg (ref 26.0–34.0)
MCHC: 34.1 g/dL (ref 30.0–36.0)
MCV: 89 fL (ref 78.0–100.0)
Platelets: 229 10*3/uL (ref 150–400)
RBC: 3.1 MIL/uL — AB (ref 3.87–5.11)
RDW: 13.4 % (ref 11.5–15.5)
WBC: 23.7 10*3/uL — ABNORMAL HIGH (ref 4.0–10.5)

## 2014-02-25 LAB — GLUCOSE, CAPILLARY
GLUCOSE-CAPILLARY: 171 mg/dL — AB (ref 70–99)
GLUCOSE-CAPILLARY: 136 mg/dL — AB (ref 70–99)
GLUCOSE-CAPILLARY: 140 mg/dL — AB (ref 70–99)
GLUCOSE-CAPILLARY: 146 mg/dL — AB (ref 70–99)
GLUCOSE-CAPILLARY: 114 mg/dL — AB (ref 70–99)
GLUCOSE-CAPILLARY: 211 mg/dL — AB (ref 70–99)
GLUCOSE-CAPILLARY: 145 mg/dL — AB (ref 70–99)
GLUCOSE-CAPILLARY: 152 mg/dL — AB (ref 70–99)
GLUCOSE-CAPILLARY: 165 mg/dL — AB (ref 70–99)
GLUCOSE-CAPILLARY: 162 mg/dL — AB (ref 70–99)
GLUCOSE-CAPILLARY: 137 mg/dL — AB (ref 70–99)
GLUCOSE-CAPILLARY: 134 mg/dL — AB (ref 70–99)
Glucose-Capillary: 127 mg/dL — ABNORMAL HIGH (ref 70–99)
Glucose-Capillary: 140 mg/dL — ABNORMAL HIGH (ref 70–99)
Glucose-Capillary: 145 mg/dL — ABNORMAL HIGH (ref 70–99)
Glucose-Capillary: 145 mg/dL — ABNORMAL HIGH (ref 70–99)
Glucose-Capillary: 146 mg/dL — ABNORMAL HIGH (ref 70–99)
Glucose-Capillary: 158 mg/dL — ABNORMAL HIGH (ref 70–99)
Glucose-Capillary: 160 mg/dL — ABNORMAL HIGH (ref 70–99)
Glucose-Capillary: 167 mg/dL — ABNORMAL HIGH (ref 70–99)
Glucose-Capillary: 172 mg/dL — ABNORMAL HIGH (ref 70–99)
Glucose-Capillary: 173 mg/dL — ABNORMAL HIGH (ref 70–99)
Glucose-Capillary: 201 mg/dL — ABNORMAL HIGH (ref 70–99)
Glucose-Capillary: 220 mg/dL — ABNORMAL HIGH (ref 70–99)
Glucose-Capillary: 93 mg/dL (ref 70–99)

## 2014-02-25 LAB — CK: Total CK: 32 U/L (ref 7–177)

## 2014-02-25 LAB — HEMOGLOBIN A1C
HEMOGLOBIN A1C: 13.2 % — AB (ref <5.7)
Mean Plasma Glucose: 332 mg/dL — ABNORMAL HIGH (ref <117)

## 2014-02-25 MED ORDER — INSULIN ASPART 100 UNIT/ML ~~LOC~~ SOLN
2.0000 [IU] | SUBCUTANEOUS | Status: DC
  Administered 2014-02-25: 2 [IU] via SUBCUTANEOUS
  Administered 2014-02-26: 6 [IU] via SUBCUTANEOUS

## 2014-02-25 MED ORDER — DEXTROSE 10 % IV SOLN: INTRAVENOUS | Status: DC | PRN

## 2014-02-25 MED ORDER — POTASSIUM CHLORIDE 10 MEQ/100ML IV SOLN
10.0000 meq | INTRAVENOUS | Status: AC
  Administered 2014-02-25 (×2): 10 meq via INTRAVENOUS
  Filled 2014-02-25 (×2): qty 100

## 2014-02-25 MED ORDER — INSULIN GLARGINE 100 UNIT/ML ~~LOC~~ SOLN
30.0000 [IU] | Freq: Two times a day (BID) | SUBCUTANEOUS | Status: DC
  Administered 2014-02-25: 30 [IU] via SUBCUTANEOUS
  Filled 2014-02-25 (×2): qty 0.3

## 2014-02-25 NOTE — Progress Notes (Signed)
[facility] PROGRESS NOTE  Jackie ANASTAS ZOX:[id number] DOB: [birth year] DOA: 02/24/2014 PCP: No primary care provider on file.  Assessment/Plan: 1. Sepsis secondary to pneumonia. Continue IV fluids and antibiotics. Follow-up cultures. Blood pressure is currently stable. Hypothermia has resolved and she is now having low-grade fevers 2. Diabetic ketoacidosis. Continue with IV insulin infusion and fluids. Hemoglobin A1c is pending. New diagnosis of diabetes. 3. Aspiration pneumonia. Likely developed after she became unresponsive. Continue antibiotics. 4. Acute renal failure. Initially felt to be related to dehydration. The patient has been aggressively hydrated with IV fluids. Her renal function appears to have worsened. We'll check renal ultrasound. Will request nephrology input. 5. Acute respiratory failure requiring intubation and mechanical ventilation. Appreciate pulmonology assistance. We'll conduct daily weaning trials. 6. Acute encephalopathy. Related to severe sepsis and dehydration. CT head did not show any acute findings. She is now opening her eyes to voice and is much more responsive during wakeup assessments. We'll need to reassess her neurologic exam when she is extubated.  Code Status: Full code Family Communication: no family present Disposition Plan: discharge home once improved   Consultants:  pulmonology  Procedures:  Intubation 1/17>>  Antibiotics:  Vancomycin 1/17  Zosyn 1/17  HPI/Subjective: Patient is sedated and intubated. Opens her eyes to voice, but then falls back asleep  Objective: Filed Vitals:   02/25/14 0900  BP: 119/59  Pulse: 89  Temp:   Resp: 13    Intake/Output Summary (Last 24 hours) at 02/25/14 0942 Last data filed at 02/25/14 0912  Gross per 24 hour  Intake 3303.55 ml  Output   2050 ml  Net 1253.55 ml   Filed Weights   02/24/14 1036 02/24/14 1616 02/25/14 0500  Weight: 61.236 kg (135 lb) 72.3 kg (159 lb 6.3 oz) 74.1 kg  (163 lb 5.8 oz)    Exam:   General:  NAD  Cardiovascular: S1, S2 RRR  Respiratory: rhonchi bilaterally  Abdomen: soft, nt, nd, bs+  Musculoskeletal: no edema b/l   Data Reviewed: Basic Metabolic Panel:  Recent Labs Lab 02/24/14 1029 02/24/14 1815 02/24/14 2330 02/25/14 0442 02/25/14 0743  NA 135 142 140 139 139  K 3.7 3.1* 3.1* 3.6 3.6  CL 99 114* 114* 114* 113*  CO2 7* 15* 18* 17* 18*  GLUCOSE 549* 273* 221* 146* 157*  BUN 37* 35* 36* 39* 40*  CREATININE 1.83* 1.38* 1.44* 1.73* 1.98*  CALCIUM 10.8* 9.1 9.2 9.1 8.9   Liver Function Tests: No results for input(s): AST, ALT, ALKPHOS, BILITOT, PROT, ALBUMIN in the last 168 hours. No results for input(s): LIPASE, AMYLASE in the last 168 hours. No results for input(s): AMMONIA in the last 168 hours. CBC:  Recent Labs Lab 02/24/14 1029 02/24/14 1815 02/25/14 0442  WBC 32.9* 23.3* 23.7*  NEUTROABS 31.0*  --   --   HGB 11.8* 9.6* 9.4*  HCT 36.7 28.8* 27.6*  MCV 96.3 92.0 89.0  PLT 323 268 229   Cardiac Enzymes:  Recent Labs Lab 02/24/14 1029  CKTOTAL 102  TROPONINI <0.03   BNP (last 3 results) No results for input(s): PROBNP in the last 8760 hours. CBG:  Recent Labs Lab 02/25/14 0459 02/25/14 0603 02/25/14 0701 02/25/14 0800 02/25/14 0856  GLUCAP 136* 146* 145* 160* 140*    Recent Results (from the past 240 hour(s))  MRSA PCR Screening     Status: None   Collection Time: 02/24/14  4:13 PM  Result Value Ref Range Status   MRSA by PCR NEGATIVE NEGATIVE Final  Comment:        The GeneXpert MRSA Assay (FDA approved for NASAL specimens only), is one component of a comprehensive MRSA colonization surveillance program. It is not intended to diagnose MRSA infection nor to guide or monitor treatment for MRSA infections.   Culture, blood (routine x 2) Call MD if unable to obtain prior to antibiotics being given     Status: None (Preliminary result)   Collection Time: 02/24/14  6:09 PM   Result Value Ref Range Status   Specimen Description BLOOD RIGHT HAND  Final   Special Requests BOTTLES DRAWN AEROBIC AND ANAEROBIC 6CC  Final   Culture PENDING  Incomplete   Report Status PENDING  Incomplete  Culture, blood (routine x 2) Call MD if unable to obtain prior to antibiotics being given     Status: None (Preliminary result)   Collection Time: 02/24/14  6:25 PM  Result Value Ref Range Status   Specimen Description A-LINE  Final   Special Requests   Final    BOTTLES DRAWN AEROBIC AND ANAEROBIC 6CC DRAWN BY RN   Culture PENDING  Incomplete   Report Status PENDING  Incomplete     Studies: Ct Head Wo Contrast  02/24/2014   CLINICAL DATA:  Found on bedroom floor unresponsive; ams  EXAM: CT HEAD WITHOUT CONTRAST  TECHNIQUE: Contiguous axial images were obtained from the base of the skull through the vertex without intravenous contrast.  COMPARISON:  None.  FINDINGS: Diffusely enlarged ventricles and subarachnoid spaces. Patchy white matter low density in both cerebral hemispheres. No skull fracture, intracranial hemorrhage, mass lesion, CT evidence of acute infarction or paranasal sinus air-fluid levels.  IMPRESSION: No acute abnormality. Mild diffuse cerebral and cerebellar atrophy and minimal chronic small vessel white matter ischemic changes in both cerebral hemispheres.   Electronically Signed   By: Gordan Payment M.D.   On: 02/24/2014 11:28   Ct Cervical Spine Wo Contrast  02/24/2014   CLINICAL DATA:  Patient found down and unresponsive.  EXAM: CT CERVICAL SPINE WITHOUT CONTRAST  TECHNIQUE: Multidetector CT imaging of the cervical spine was performed without intravenous contrast. Multiplanar CT image reconstructions were also generated.  COMPARISON:  None.  FINDINGS: There is mild reversal of the normal cervical lordosis. No fracture or malalignment is identified. 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 Lung apices are clear. Gas in soft tissue structures in the upper chest is likely related to vascular access.  IMPRESSION: No acute abnormality.  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   Electronically Signed   By: Drusilla Kanner M.D.   On: 02/24/2014 12:56   Dg Chest Port 1 View  02/25/2014   CLINICAL DATA:  Subsequent encounter for ventilator dependence and endotracheal tube placement.  EXAM: PORTABLE CHEST - 1 VIEW  COMPARISON:  Multiple recent previous exams.  FINDINGS: 0543 hrs. Endotracheal tube tip is 1.3 cm above the base of the carina. The NG tube passes into the stomach although the distal tip position is not included on the film. Right PICC line tip overlies the distal SVC near the RA junction. Patchy bilateral airspace disease is stable. Cardiopericardial silhouette is at upper limits of normal for size. Telemetry leads overlie the chest.  IMPRESSION: Stable exam. No interval change in the patchy bilateral airspace disease suggesting multi focal pneumonia.  Electronically Signed   By: Kennith CenterEric  Mansell M.D.   On: 02/25/2014 07:14   Dg Chest Port 1 View  02/24/2014   CLINICAL DATA:  Found on bedroom floor unresponsive; ams  EXAM: PORTABLE CHEST - 1 VIEW  COMPARISON:  None.  FINDINGS: Enlarged cardiac silhouette. Extensive patchy opacity in both lungs. Mildly prominent interstitial markings. No pleural fluid seen. Unremarkable bones.  IMPRESSION: 1. Extensive bilateral airspace opacity, most likely due to pneumonia or aspiration pneumonitis. This does not have the typical appearance of pulmonary edema. Follow-up to resolution is recommended to exclude any underlying masses. 2. Cardiomegaly and probable chronic interstitial lung disease.   Electronically Signed   By: Gordan PaymentSteve  Reid M.D.   On: 02/24/2014 11:27   Dg Chest Port 1v Same Day  02/24/2014   CLINICAL DATA:  Right PICC line  placement  EXAM: PORTABLE CHEST - 1 VIEW SAME DAY  COMPARISON:  02/24/2014  FINDINGS: Right PICC line is in place. The tip is in the lower right atrium approximately 7-8 cm deep to the cavoatrial junction. Patchy bilateral areas of consolidation are again noted, unchanged. Endotracheal tube remains just above the carina, approximately 12 mm above the carina. NG tube is in the stomach.  IMPRESSION: Right PICC line tip in the lower right atrium approximately 7-8 cm deep to the cavoatrial junction.  Endotracheal tube approximately 12 mm above the carina.  Stable patchy bilateral airspace disease/consolidation.   Electronically Signed   By: Charlett NoseKevin  Dover M.D.   On: 02/24/2014 17:49   Dg Chest Port 1v Same Day  02/24/2014   CLINICAL DATA:  ET tube placement  EXAM: PORTABLE CHEST - 1 VIEW SAME DAY  COMPARISON:  02/24/2014  FINDINGS: Endotracheal tube is 1.6 cm above the carina. NG tube enters the stomach. Bilateral lower lobe airspace opacities as well as left upper lobe opacity again noted, unchanged. Heart is borderline in size.  IMPRESSION: Endotracheal tube 1.6 cm above the carina.  Bilateral airspace opacities are stable.   Electronically Signed   By: Charlett NoseKevin  Dover M.D.   On: 02/24/2014 14:05    Scheduled Meds: . antiseptic oral rinse  7 mL Mouth Rinse QID  . chlorhexidine  15 mL Mouth Rinse BID  . heparin  5,000 Units Subcutaneous 3 times per day  . ipratropium-albuterol  3 mL Nebulization Q4H  . pantoprazole (PROTONIX) IV  40 mg Intravenous QHS  . piperacillin-tazobactam (ZOSYN)  IV  3.375 g Intravenous Q8H  . sodium chloride  10-40 mL Intracatheter Q12H  . vancomycin  1,000 mg Intravenous Q24H   Continuous Infusions: . sodium chloride Stopped (02/24/14 1811)  . dextrose 5 % and 0.45% NaCl 125 mL/hr at 02/25/14 0912  . fentaNYL infusion INTRAVENOUS 200 mcg/hr (02/25/14 0912)  . insulin (NOVOLIN-R) infusion 6 mL/hr at 02/25/14 0600    Active Problems:   Acute encephalopathy   DKA (diabetic  ketoacidoses)   Acute respiratory failure with hypoxia   Acute renal failure   Sepsis with acute organ dysfunction   Aspiration pneumonia   Metabolic acidosis   Sepsis    Time spent: 30mins    Donya Hitch  [facility] Pager [phone]. If 7PM-7AM, please contact night-coverage at [email or web address], password [email or web address] 02/25/2014, 9:42 AM  LOS: 1 day

## 2014-02-25 NOTE — Care Management Note (Signed)
    Page 1 of 1   02/25/2014     3:17:57 PM CARE MANAGEMENT NOTE 02/25/2014  Patient:  Jackie Fisher,Jackie Fisher   Account Number:  [id number]  Date Initiated:  02/25/2014  Documentation initiated by:  Kathyrn SheriffHILDRESS,JESSICA  Subjective/Objective Assessment:   Pt found unresponsive at home. Information obtained from brother. Pt lives alone independently. Pt has not [facility] in years [facility]     Action/Plan:   Pt is intubated and unconsious at this time. Will continue to follow for CM needs.   Anticipated DC Date:  03/02/2014   Anticipated DC Plan:  HOME/SELF CARE      DC Planning Services  CM consult      Choice offered to / List presented to:             Status of service:  In process, will continue to follow Medicare Important Message given?   (If response is "NO", the following Medicare IM given date fields will be blank) Date Medicare IM given:   Medicare IM given by:   Date Additional Medicare IM given:   Additional Medicare IM given by:    Discharge Disposition:  HOME/SELF CARE  Per UR Regulation:    If discussed at Long Length of Stay Meetings, dates discussed:    Comments:  02/25/2014 1500 Kathyrn SheriffJessica Childress, RN, MSN, [facility]

## 2014-02-25 NOTE — Progress Notes (Signed)
Dr Sharl MaLama paged with lab results; orders received to replace potassium; see orders

## 2014-02-25 NOTE — Care Management Utilization Note (Signed)
UR completed 

## 2014-02-25 NOTE — Progress Notes (Signed)
Inpatient Diabetes Program Recommendations  AACE/ADA: New Consensus Statement on Inpatient Glycemic Control (2013)  Target Ranges:  Prepandial:   less than 140 mg/dL      Peak postprandial:   less than 180 mg/dL (1-2 hours)      Critically ill patients:  140 - 180 mg/dL  Results for Rhett BannisterMILES, Jayline D (MRN [id number]) as of 02/25/2014 08:11  Ref. Range 02/24/2014 10:29  Glucose Latest Range: 70-99 mg/dL 952549 (H)   Results for Rhett BannisterMILES, Marny D (MRN [id number]) as of 02/25/2014 08:11  Ref. Range 02/25/2014 03:03 02/25/2014 04:05 02/25/2014 04:59 02/25/2014 06:03 02/25/2014 07:01  Glucose-Capillary Latest Range: 70-99 mg/dL 027140 (H) 253127 (H) 664136 (H) 146 (H) 145 (H)   Diabetes history: No Outpatient Diabetes medications: NA Current orders for Inpatient glycemic control: IV insulin per GlucoStablizer on DKA order set  Inpatient Diabetes Program Recommendations Insulin - IV drip/GlucoStabilizer: Patient has no known history of diabetes. Initial glucose was 549 mg/dl and patient was started on an insulin drip per DKA order set. According to labs today at 4:42 am CO2 17 and AG 8. Over the past 5 hours CBGs have been in target but patient has required 36.9 units over the past 5 hours (drip rates have ranged from 5.3 to 7.2 units/hour).  Since patient has required a significant amount of insulin to maintain glucose in target ranges and still acidotic on labs, recommend to continue insulin drip at this time. Noted A1C has been ordered and is in process.  Thanks, Orlando PennerMarie Srinika Delone, RN, MSN, CCRN, CDE Diabetes Coordinator Inpatient Diabetes Program [phone] (Team Pager) [phone] ([facility] office) [phone] [facility] office)

## 2014-02-25 NOTE — Plan of Care (Signed)
Problem: Phase I Progression Outcomes Goal: CBGs steadily decreasing on IV insulin drip Outcome: Progressing Insulin drip being titrated per glucostablizer  Goal: Monitor hydration status Outcome: Progressing Strict I&O's Goal: Acidosis resolving Outcome: Progressing Improving slowly Goal: K+ level approaching normal with therapy Outcome: Progressing Patient receiving potassium replacement as ordered Goal: Pain controlled with appropriate interventions Outcome: Progressing Medications being titrated per order according to RASS and CPOT Goal: OOB as tolerated unless otherwise ordered Outcome: Not Applicable Date Met:  74/16/38 Strict bedrest while intubated    Goal: Initial discharge plan identified Outcome: Progressing home Goal: Voiding-avoid urinary catheter unless indicated Outcome: Progressing Currently unstable on the vent with a foley for strict I&O's Goal: Pt. states reason for hospitalization Outcome: Not Met (add Reason) Patient currently intubated and sedated

## 2014-02-25 NOTE — Progress Notes (Signed)
Called mid-level T Claiborne BillingsCallahan about patients dry eyes while intubated and sedated; orders received for lacrilube PRN

## 2014-02-25 NOTE — Progress Notes (Signed)
eLink Physician-Brief Progress Note Patient Name: Jackie BannisterLula D Fisher DOB: [birth year] MRN: [id number]   Date of Service  02/25/2014  HPI/Events of Note  Hyperglycemia , on insulin drip 6 u /hr  eICU Interventions  Transition to lantus 30 units bid and use phase III icu protocol      Intervention Category Major Interventions: Hyperglycemia - active titration of insulin therapy  [name] 02/25/2014, 9:17 PM

## 2014-02-25 NOTE — Consult Note (Addendum)
Reason for Consult: Acute kidney injury Referring Physician: Dr. Christy Sartorius is an 59 y.o. female.  HPI: She is a patient with no significant past medical history was brought to the hospital by her brother after he was found confused and lying down. When she was evaluated in emergency room patient was found to have hyperglycemia, acidosis and possibly acute renal failure. Since the patient was hypoxic she was intubated and moved to intensive care unit. Presently consult is called because of worsening of renal failure. Patient is intubated and seems to be very lethargic and unable to get any additional history.  No past medical history on file.  No past surgical history on file.  Family History  Problem Relation Age of Onset  . Family history unknown: Yes    Social History:  has no tobacco, alcohol, and drug history on file.  Allergies: Allergies not on file  Medications: I have reviewed the patient's current medications.  Results for orders placed or performed during the hospital encounter of 02/24/14 (from the past 48 hour(s))  Basic metabolic panel     Status: Abnormal   Collection Time: 02/24/14 10:29 AM  Result Value Ref Range   Sodium 135 135 - 145 mmol/L    Comment: Please note change in reference range.   Potassium 3.7 3.5 - 5.1 mmol/L    Comment: Please note change in reference range.   Chloride 99 96 - 112 mEq/L   CO2 7 (LL) 19 - 32 mmol/L    Comment: REPEATED TO VERIFY CRITICAL RESULT CALLED TO, READ BACK BY AND VERIFIED WITH: HUNNICUTT C. AT [id number] ON [id number] BY THOMPSON S.    Glucose, Bld 549 (H) 70 - 99 mg/dL   BUN 37 (H) 6 - 23 mg/dL   Creatinine, Ser 1.83 (H) 0.50 - 1.10 mg/dL   Calcium 10.8 (H) 8.4 - 10.5 mg/dL   GFR calc non Af Amer 29 (L) >90 mL/min   GFR calc Af Amer 34 (L) >90 mL/min    Comment: (NOTE) The eGFR has been calculated using the CKD EPI equation. This calculation has not been validated in all clinical situations. eGFR's persistently  <90 mL/min signify possible Chronic Kidney Disease.    Anion gap 29 (H) 5 - 15  CBC with Differential     Status: Abnormal   Collection Time: 02/24/14 10:29 AM  Result Value Ref Range   WBC 32.9 (H) 4.0 - 10.5 K/uL   RBC 3.81 (L) 3.87 - 5.11 MIL/uL   Hemoglobin 11.8 (L) 12.0 - 15.0 g/dL   HCT 36.7 36.0 - 46.0 %   MCV 96.3 78.0 - 100.0 fL   MCH 31.0 26.0 - 34.0 pg   MCHC 32.2 30.0 - 36.0 g/dL   RDW 14.3 11.5 - 15.5 %   Platelets 323 150 - 400 K/uL   Neutrophils Relative % 94 (H) 43 - 77 %   Neutro Abs 31.0 (H) 1.7 - 7.7 K/uL   Lymphocytes Relative 2 (L) 12 - 46 %   Lymphs Abs 0.8 0.7 - 4.0 K/uL   Monocytes Relative 3 3 - 12 %   Monocytes Absolute 1.0 0.1 - 1.0 K/uL   Eosinophils Relative 0 0 - 5 %   Eosinophils Absolute 0.0 0.0 - 0.7 K/uL   Basophils Relative 0 0 - 1 %   Basophils Absolute 0.1 0.0 - 0.1 K/uL   WBC Morphology SMUDGE CELLS     Comment: MILD LEFT SHIFT (1-5% METAS, OCC MYELO, OCC BANDS)  DOHLE BODIES VACUOLATED NEUTROPHILS    RBC Morphology RARE NRBCs    Smear Review LARGE PLATELETS PRESENT   Troponin I     Status: None   Collection Time: 02/24/14 10:29 AM  Result Value Ref Range   Troponin I <0.03 <0.031 ng/mL    Comment:        NO INDICATION OF MYOCARDIAL INJURY. Please note change in reference range.   Ethanol     Status: None   Collection Time: 02/24/14 10:29 AM  Result Value Ref Range   Alcohol, Ethyl (B) <5 0 - 9 mg/dL    Comment:        LOWEST DETECTABLE LIMIT FOR SERUM ALCOHOL IS 11 mg/dL FOR MEDICAL PURPOSES ONLY   CK     Status: None   Collection Time: 02/24/14 10:29 AM  Result Value Ref Range   Total CK 102 7 - 177 U/L  Urine rapid drug screen (hosp performed)     Status: Abnormal   Collection Time: 02/24/14 10:33 AM  Result Value Ref Range   Opiates NONE DETECTED NONE DETECTED   Cocaine NONE DETECTED NONE DETECTED   Benzodiazepines NONE DETECTED NONE DETECTED   Amphetamines NONE DETECTED NONE DETECTED   Tetrahydrocannabinol  POSITIVE (A) NONE DETECTED   Barbiturates NONE DETECTED NONE DETECTED    Comment:        DRUG SCREEN FOR MEDICAL PURPOSES ONLY.  IF CONFIRMATION IS NEEDED FOR ANY PURPOSE, NOTIFY LAB WITHIN 5 DAYS.        LOWEST DETECTABLE LIMITS FOR URINE DRUG SCREEN Drug Class       Cutoff (ng/mL) Amphetamine      1000 Barbiturate      200 Benzodiazepine   341 Tricyclics       937 Opiates          300 Cocaine          300 THC              50   Urinalysis, Routine w reflex microscopic     Status: Abnormal   Collection Time: 02/24/14 10:33 AM  Result Value Ref Range   Color, Urine YELLOW YELLOW   APPearance CLOUDY (A) CLEAR   Specific Gravity, Urine 1.020 1.005 - 1.030   pH 5.5 5.0 - 8.0   Glucose, UA >1000 (A) NEGATIVE mg/dL   Hgb urine dipstick LARGE (A) NEGATIVE   Bilirubin Urine SMALL (A) NEGATIVE   Ketones, ur >80 (A) NEGATIVE mg/dL   Protein, ur 100 (A) NEGATIVE mg/dL   Urobilinogen, UA 0.2 0.0 - 1.0 mg/dL   Nitrite NEGATIVE NEGATIVE   Leukocytes, UA NEGATIVE NEGATIVE  Urine microscopic-add on     Status: Abnormal   Collection Time: 02/24/14 10:33 AM  Result Value Ref Range   Squamous Epithelial / LPF RARE RARE   WBC, UA 0-2 <3 WBC/hpf   RBC / HPF 21-50 <3 RBC/hpf   Bacteria, UA MANY (A) RARE   Casts GRANULAR CAST (A) NEGATIVE  POC CBG, ED     Status: Abnormal   Collection Time: 02/24/14  1:30 PM  Result Value Ref Range   Glucose-Capillary 437 (H) 70 - 99 mg/dL  Blood gas, arterial     Status: Abnormal   Collection Time: 02/24/14  1:31 PM  Result Value Ref Range   FIO2 110.00 %   Delivery systems VENTILATOR    Mode PRESSURE REGULATED VOLUME CONTROL    VT 450 mL   Rate 15 resp/min   Peep/cpap 5.0 cm  H20   pH, Arterial 7.007 (LL) 7.350 - 7.450    Comment: CRITICAL RESULT CALLED TO, READ BACK BY AND VERIFIED WITH: CINDY BORTZ,RN AT 1349 BY VANESSA LAWSON,RRT,RCP ON 02/24/2014    pCO2 arterial 27.0 (L) 35.0 - 45.0 mmHg   pO2, Arterial 89.7 80.0 - 100.0 mmHg   Bicarbonate  6.4 (L) 20.0 - 24.0 mEq/L   TCO2 6.6 0 - 100 mmol/L   Acid-base deficit 22.5 (H) 0.0 - 2.0 mmol/L   O2 Saturation 93.9 %   Patient temperature 37.0    Collection site RIGHT RADIAL    Drawn by COLLECTED BY RT    Sample type ARTERIAL    Allens test (pass/fail) PASS PASS  CBG monitoring, ED     Status: Abnormal   Collection Time: 02/24/14  3:01 PM  Result Value Ref Range   Glucose-Capillary 476 (H) 70 - 99 mg/dL  Glucose, capillary     Status: Abnormal   Collection Time: 02/24/14  4:04 PM  Result Value Ref Range   Glucose-Capillary 365 (H) 70 - 99 mg/dL   Comment 1 Documented in Chart    Comment 2 Notify RN   MRSA PCR Screening     Status: None   Collection Time: 02/24/14  4:13 PM  Result Value Ref Range   MRSA by PCR NEGATIVE NEGATIVE    Comment:        The GeneXpert MRSA Assay (FDA approved for NASAL specimens only), is one component of a comprehensive MRSA colonization surveillance program. It is not intended to diagnose MRSA infection nor to guide or monitor treatment for MRSA infections.   Glucose, capillary     Status: Abnormal   Collection Time: 02/24/14  5:11 PM  Result Value Ref Range   Glucose-Capillary 395 (H) 70 - 99 mg/dL   Comment 1 Documented in Chart    Comment 2 Notify RN   Influenza panel by pcr     Status: None   Collection Time: 02/24/14  5:50 PM  Result Value Ref Range   Influenza A By PCR NEGATIVE NEGATIVE   Influenza B By PCR NEGATIVE NEGATIVE   H1N1 flu by pcr NOT DETECTED NOT DETECTED    Comment:        The Xpert Flu assay (FDA approved for nasal aspirates or washes and nasopharyngeal swab specimens), is intended as an aid in the diagnosis of influenza and should not be used as a sole basis for treatment.   Strep pneumoniae urinary antigen     Status: None   Collection Time: 02/24/14  5:52 PM  Result Value Ref Range   Strep Pneumo Urinary Antigen NEGATIVE NEGATIVE    Comment: PERFORMED AT [facility]        Infection due to S.  pneumoniae cannot be absolutely ruled out since the antigen present may be below the detection limit of the test. Performed at [facility]   Culture, blood (routine x 2) Call MD if unable to obtain prior to antibiotics being given     Status: None (Preliminary result)   Collection Time: 02/24/14  6:09 PM  Result Value Ref Range   Specimen Description BLOOD RIGHT HAND    Special Requests BOTTLES DRAWN AEROBIC AND ANAEROBIC 6CC    Culture NO GROWTH 1 DAY    Report Status PENDING   Glucose, capillary     Status: Abnormal   Collection Time: 02/24/14  6:10 PM  Result Value Ref Range   Glucose-Capillary 248 (H) 70 -  99 mg/dL  Basic metabolic panel (stat then every 4 hours)     Status: Abnormal   Collection Time: 02/24/14  6:15 PM  Result Value Ref Range   Sodium 142 135 - 145 mmol/L    Comment: Please note change in reference range. DELTA CHECK NOTED    Potassium 3.1 (L) 3.5 - 5.1 mmol/L    Comment: Please note change in reference range.   Chloride 114 (H) 96 - 112 mEq/L    Comment: DELTA CHECK NOTED   CO2 15 (L) 19 - 32 mmol/L   Glucose, Bld 273 (H) 70 - 99 mg/dL   BUN 35 (H) 6 - 23 mg/dL   Creatinine, Ser 1.38 (H) 0.50 - 1.10 mg/dL   Calcium 9.1 8.4 - 10.5 mg/dL   GFR calc non Af Amer 41 (L) >90 mL/min   GFR calc Af Amer 48 (L) >90 mL/min    Comment: (NOTE) The eGFR has been calculated using the CKD EPI equation. This calculation has not been validated in all clinical situations. eGFR's persistently <90 mL/min signify possible Chronic Kidney Disease.    Anion gap 13 5 - 15  CBC     Status: Abnormal   Collection Time: 02/24/14  6:15 PM  Result Value Ref Range   WBC 23.3 (H) 4.0 - 10.5 K/uL   RBC 3.13 (L) 3.87 - 5.11 MIL/uL   Hemoglobin 9.6 (L) 12.0 - 15.0 g/dL    Comment: DELTA CHECK NOTED   HCT 28.8 (L) 36.0 - 46.0 %   MCV 92.0 78.0 - 100.0 fL   MCH 30.7 26.0 - 34.0 pg   MCHC 33.3 30.0 - 36.0 g/dL   RDW 14.1 11.5 - 15.5 %   Platelets 268 150 - 400 K/uL   Ketones, qualitative     Status: Abnormal   Collection Time: 02/24/14  6:15 PM  Result Value Ref Range   Acetone, Bld SMALL (A) NEGATIVE  Hemoglobin A1c     Status: Abnormal   Collection Time: 02/24/14  6:15 PM  Result Value Ref Range   Hgb A1c MFr Bld 13.2 (H) <5.7 %    Comment: (NOTE)                                                                       According to the ADA Clinical Practice Recommendations for 2011, when HbA1c is used as a screening test:  >=6.5%   Diagnostic of Diabetes Mellitus           (if abnormal result is confirmed) 5.7-6.4%   Increased risk of developing Diabetes Mellitus References:Diagnosis and Classification of Diabetes Mellitus,Diabetes BJYN,8295,62(ZHYQM 1):S62-S69 and Standards of Medical Care in         Diabetes - 2011,Diabetes Care,2011,34 (Suppl 1):S11-S61.    Mean Plasma Glucose 332 (H) <117 mg/dL    Comment: Performed at [facility], blood (routine x 2) Call MD if unable to obtain prior to antibiotics being given     Status: None (Preliminary result)   Collection Time: 02/24/14  6:25 PM  Result Value Ref Range   Specimen Description A-LINE    Special Requests      BOTTLES DRAWN AEROBIC AND ANAEROBIC 6CC DRAWN BY RN   Culture NO  GROWTH 1 DAY    Report Status PENDING   Lactic acid, plasma     Status: None   Collection Time: 02/24/14  6:25 PM  Result Value Ref Range   Lactic Acid, Venous 1.8 0.5 - 2.2 mmol/L  Glucose, capillary     Status: Abnormal   Collection Time: 02/24/14  7:25 PM  Result Value Ref Range   Glucose-Capillary 229 (H) 70 - 99 mg/dL  Glucose, capillary     Status: Abnormal   Collection Time: 02/24/14  8:18 PM  Result Value Ref Range   Glucose-Capillary 239 (H) 70 - 99 mg/dL  Glucose, capillary     Status: Abnormal   Collection Time: 02/24/14  9:22 PM  Result Value Ref Range   Glucose-Capillary 224 (H) 70 - 99 mg/dL  Glucose, capillary     Status: Abnormal   Collection Time: 02/24/14 11:02 PM   Result Value Ref Range   Glucose-Capillary 223 (H) 70 - 99 mg/dL  Basic metabolic panel (stat then every 4 hours)     Status: Abnormal   Collection Time: 02/24/14 11:30 PM  Result Value Ref Range   Sodium 140 135 - 145 mmol/L    Comment: Please note change in reference range.   Potassium 3.1 (L) 3.5 - 5.1 mmol/L    Comment: Please note change in reference range.   Chloride 114 (H) 96 - 112 mEq/L   CO2 18 (L) 19 - 32 mmol/L   Glucose, Bld 221 (H) 70 - 99 mg/dL   BUN 36 (H) 6 - 23 mg/dL   Creatinine, Ser 1.44 (H) 0.50 - 1.10 mg/dL   Calcium 9.2 8.4 - 10.5 mg/dL   GFR calc non Af Amer 39 (L) >90 mL/min   GFR calc Af Amer 45 (L) >90 mL/min    Comment: (NOTE) The eGFR has been calculated using the CKD EPI equation. This calculation has not been validated in all clinical situations. eGFR's persistently <90 mL/min signify possible Chronic Kidney Disease.    Anion gap 8 5 - 15  Glucose, capillary     Status: Abnormal   Collection Time: 02/25/14 12:06 AM  Result Value Ref Range   Glucose-Capillary 220 (H) 70 - 99 mg/dL  Glucose, capillary     Status: Abnormal   Collection Time: 02/25/14  1:05 AM  Result Value Ref Range   Glucose-Capillary 171 (H) 70 - 99 mg/dL  Glucose, capillary     Status: Abnormal   Collection Time: 02/25/14  2:10 AM  Result Value Ref Range   Glucose-Capillary 201 (H) 70 - 99 mg/dL  Glucose, capillary     Status: Abnormal   Collection Time: 02/25/14  3:03 AM  Result Value Ref Range   Glucose-Capillary 140 (H) 70 - 99 mg/dL  Glucose, capillary     Status: Abnormal   Collection Time: 02/25/14  4:05 AM  Result Value Ref Range   Glucose-Capillary 127 (H) 70 - 99 mg/dL  Basic metabolic panel (stat then every 4 hours)     Status: Abnormal   Collection Time: 02/25/14  4:42 AM  Result Value Ref Range   Sodium 139 135 - 145 mmol/L    Comment: Please note change in reference range.   Potassium 3.6 3.5 - 5.1 mmol/L    Comment: Please note change in reference range.    Chloride 114 (H) 96 - 112 mEq/L   CO2 17 (L) 19 - 32 mmol/L   Glucose, Bld 146 (H) 70 - 99 mg/dL   BUN  39 (H) 6 - 23 mg/dL   Creatinine, Ser 1.73 (H) 0.50 - 1.10 mg/dL   Calcium 9.1 8.4 - 10.5 mg/dL   GFR calc non Af Amer 31 (L) >90 mL/min   GFR calc Af Amer 36 (L) >90 mL/min    Comment: (NOTE) The eGFR has been calculated using the CKD EPI equation. This calculation has not been validated in all clinical situations. eGFR's persistently <90 mL/min signify possible Chronic Kidney Disease.    Anion gap 8 5 - 15  CBC     Status: Abnormal   Collection Time: 02/25/14  4:42 AM  Result Value Ref Range   WBC 23.7 (H) 4.0 - 10.5 K/uL   RBC 3.10 (L) 3.87 - 5.11 MIL/uL   Hemoglobin 9.4 (L) 12.0 - 15.0 g/dL   HCT 27.6 (L) 36.0 - 46.0 %   MCV 89.0 78.0 - 100.0 fL   MCH 30.3 26.0 - 34.0 pg   MCHC 34.1 30.0 - 36.0 g/dL   RDW 13.4 11.5 - 15.5 %   Platelets 229 150 - 400 K/uL  CK     Status: None   Collection Time: 02/25/14  4:42 AM  Result Value Ref Range   Total CK 32 7 - 177 U/L  Glucose, capillary     Status: Abnormal   Collection Time: 02/25/14  4:59 AM  Result Value Ref Range   Glucose-Capillary 136 (H) 70 - 99 mg/dL  Blood gas, arterial     Status: Abnormal   Collection Time: 02/25/14  5:00 AM  Result Value Ref Range   FIO2 0.40 %   Delivery systems VENTILATOR    Mode PRESSURE REGULATED VOLUME CONTROL    VT 450 mL   Rate 15.0 resp/min   Peep/cpap 5.0 cm H20   pH, Arterial 7.271 (L) 7.350 - 7.450   pCO2 arterial 37.2 35.0 - 45.0 mmHg   pO2, Arterial 61.3 (L) 80.0 - 100.0 mmHg   Bicarbonate 16.5 (L) 20.0 - 24.0 mEq/L   TCO2 15.9 0 - 100 mmol/L   Acid-Base Excess 9.0 (H) 0.0 - 2.0 mmol/L   Acid-base deficit 9.1 (H) 0.0 - 2.0 mmol/L   O2 Saturation 91.2 %   Collection site LEFT RADIAL    Drawn by [id number]    Sample type ARTERIAL DRAW    Allens test (pass/fail) PASS PASS  Glucose, capillary     Status: Abnormal   Collection Time: 02/25/14  6:03 AM  Result Value Ref Range    Glucose-Capillary 146 (H) 70 - 99 mg/dL  Glucose, capillary     Status: Abnormal   Collection Time: 02/25/14  7:01 AM  Result Value Ref Range   Glucose-Capillary 145 (H) 70 - 99 mg/dL  Basic metabolic panel (stat then every 4 hours)     Status: Abnormal   Collection Time: 02/25/14  7:43 AM  Result Value Ref Range   Sodium 139 135 - 145 mmol/L    Comment: Please note change in reference range.   Potassium 3.6 3.5 - 5.1 mmol/L    Comment: Please note change in reference range.   Chloride 113 (H) 96 - 112 mEq/L   CO2 18 (L) 19 - 32 mmol/L   Glucose, Bld 157 (H) 70 - 99 mg/dL   BUN 40 (H) 6 - 23 mg/dL   Creatinine, Ser 1.98 (H) 0.50 - 1.10 mg/dL   Calcium 8.9 8.4 - 10.5 mg/dL   GFR calc non Af Amer 27 (L) >90 mL/min   GFR calc Af  Amer 31 (L) >90 mL/min    Comment: (NOTE) The eGFR has been calculated using the CKD EPI equation. This calculation has not been validated in all clinical situations. eGFR's persistently <90 mL/min signify possible Chronic Kidney Disease.    Anion gap 8 5 - 15  Glucose, capillary     Status: Abnormal   Collection Time: 02/25/14  8:00 AM  Result Value Ref Range   Glucose-Capillary 160 (H) 70 - 99 mg/dL  Glucose, capillary     Status: Abnormal   Collection Time: 02/25/14  8:56 AM  Result Value Ref Range   Glucose-Capillary 140 (H) 70 - 99 mg/dL   Comment 1 Notify RN   Glucose, capillary     Status: Abnormal   Collection Time: 02/25/14  9:56 AM  Result Value Ref Range   Glucose-Capillary 146 (H) 70 - 99 mg/dL   Comment 1 Notify RN   Glucose, capillary     Status: Abnormal   Collection Time: 02/25/14 10:57 AM  Result Value Ref Range   Glucose-Capillary 114 (H) 70 - 99 mg/dL  Basic metabolic panel (stat then every 4 hours)     Status: Abnormal   Collection Time: 02/25/14 11:36 AM  Result Value Ref Range   Sodium 138 135 - 145 mmol/L    Comment: Please note change in reference range.   Potassium 3.8 3.5 - 5.1 mmol/L    Comment: Please note change  in reference range.   Chloride 113 (H) 96 - 112 mEq/L   CO2 19 19 - 32 mmol/L   Glucose, Bld 138 (H) 70 - 99 mg/dL   BUN 43 (H) 6 - 23 mg/dL   Creatinine, Ser 2.25 (H) 0.50 - 1.10 mg/dL   Calcium 8.8 8.4 - 10.5 mg/dL   GFR calc non Af Amer 23 (L) >90 mL/min   GFR calc Af Amer 26 (L) >90 mL/min    Comment: (NOTE) The eGFR has been calculated using the CKD EPI equation. This calculation has not been validated in all clinical situations. eGFR's persistently <90 mL/min signify possible Chronic Kidney Disease.    Anion gap 6 5 - 15  Glucose, capillary     Status: None   Collection Time: 02/25/14 12:00 PM  Result Value Ref Range   Glucose-Capillary 93 70 - 99 mg/dL  Glucose, capillary     Status: Abnormal   Collection Time: 02/25/14 12:56 PM  Result Value Ref Range   Glucose-Capillary 211 (H) 70 - 99 mg/dL   Comment 1 Notify RN   Glucose, capillary     Status: Abnormal   Collection Time: 02/25/14  2:06 PM  Result Value Ref Range   Glucose-Capillary 172 (H) 70 - 99 mg/dL   Comment 1 Notify RN     Ct Head Wo Contrast  02/24/2014   CLINICAL DATA:  Found on bedroom floor unresponsive; ams  EXAM: CT HEAD WITHOUT CONTRAST  TECHNIQUE: Contiguous axial images were obtained from the base of the skull through the vertex without intravenous contrast.  COMPARISON:  None.  FINDINGS: Diffusely enlarged ventricles and subarachnoid spaces. Patchy white matter low density in both cerebral hemispheres. No skull fracture, intracranial hemorrhage, mass lesion, CT evidence of acute infarction or paranasal sinus air-fluid levels.  IMPRESSION: No acute abnormality. Mild diffuse cerebral and cerebellar atrophy and minimal chronic small vessel white matter ischemic changes in both cerebral hemispheres.   Electronically Signed   By: Enrique Sack M.D.   On: 02/24/2014 11:28   Ct Cervical Spine Wo  Contrast  02/24/2014   CLINICAL DATA:  Patient found down and unresponsive.  EXAM: CT CERVICAL SPINE WITHOUT CONTRAST   TECHNIQUE: Multidetector CT imaging of the cervical spine was performed without intravenous contrast. Multiplanar CT image reconstructions were also generated.  COMPARISON:  None.  FINDINGS: There is mild reversal of the normal cervical lordosis. No fracture or malalignment is identified. 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 Lung apices are clear. Gas in soft tissue structures in the upper chest is likely related to vascular access.  IMPRESSION: No acute abnormality.  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   Electronically Signed   By: Inge Rise M.D.   On: 02/24/2014 12:56   US Renal  02/25/2014   CLINICAL DATA:  Acute renal failure, increased BUN and creatinine  EXAM: RENAL/URINARY TRACT ULTRASOUND COMPLETE  COMPARISON:  None.  FINDINGS: Right Kidney:  Length: 12.0 cm. Echogenicity within normal limits. No mass or hydronephrosis visualized. Small amount of perinephric fluid noted lower pole.  Left Kidney:  Length: 13.2 cm. Echogenicity within normal limits. No mass or hydronephrosis visualized.  Bladder:  There is a Foley catheter within urinary bladder. Some debris noted within dependent gallbladder.  IMPRESSION: No hydronephrosis. No diagnostic renal calculus. Small perinephric fluid lower pole of the right kidney. Foley catheter noted within a decompressed urinary bladder. Probable small amount of debris within bladder.   Electronically Signed   By: Lahoma Crocker M.D.   On: 02/25/2014 13:24   Dg Chest Port 1 View  02/25/2014   CLINICAL DATA:  Subsequent encounter for ventilator dependence and endotracheal tube placement.  EXAM: PORTABLE CHEST - 1 VIEW  COMPARISON:  Multiple recent previous exams.  FINDINGS: 0543 hrs. Endotracheal tube tip is 1.3 cm above the base of the carina. The NG tube passes  into the stomach although the distal tip position is not included on the film. Right PICC line tip overlies the distal SVC near the RA junction. Patchy bilateral airspace disease is stable. Cardiopericardial silhouette is at upper limits of normal for size. Telemetry leads overlie the chest.  IMPRESSION: Stable exam. No interval change in the patchy bilateral airspace disease suggesting multi focal pneumonia.   Electronically Signed   By: Misty Stanley M.D.   On: 02/25/2014 07:14   Dg Chest Port 1 View  02/24/2014   CLINICAL DATA:  Found on bedroom floor unresponsive; ams  EXAM: PORTABLE CHEST - 1 VIEW  COMPARISON:  None.  FINDINGS: Enlarged cardiac silhouette. Extensive patchy opacity in both lungs. Mildly prominent interstitial markings. No pleural fluid seen. Unremarkable bones.  IMPRESSION: 1. Extensive bilateral airspace opacity, most likely due to pneumonia or aspiration pneumonitis. This does not have the typical appearance of pulmonary edema. Follow-up to resolution is recommended to exclude any underlying masses. 2. Cardiomegaly and probable chronic interstitial lung disease.   Electronically Signed   By: Enrique Sack M.D.   On: 02/24/2014 11:27   Dg Chest Port 1v Same Day  02/24/2014   CLINICAL DATA:  Right PICC line placement  EXAM: PORTABLE CHEST - 1 VIEW SAME DAY  COMPARISON:  02/24/2014  FINDINGS: Right PICC line is in place. The tip is in the lower right atrium approximately 7-8 cm deep to the cavoatrial junction. Patchy bilateral areas of consolidation are again noted, unchanged. Endotracheal tube remains  just above the carina, approximately 12 mm above the carina. NG tube is in the stomach.  IMPRESSION: Right PICC line tip in the lower right atrium approximately 7-8 cm deep to the cavoatrial junction.  Endotracheal tube approximately 12 mm above the carina.  Stable patchy bilateral airspace disease/consolidation.   Electronically Signed   By: Rolm Baptise M.D.   On: 02/24/2014 17:49   Dg  Chest Port 1v Same Day  02/24/2014   CLINICAL DATA:  ET tube placement  EXAM: PORTABLE CHEST - 1 VIEW SAME DAY  COMPARISON:  02/24/2014  FINDINGS: Endotracheal tube is 1.6 cm above the carina. NG tube enters the stomach. Bilateral lower lobe airspace opacities as well as left upper lobe opacity again noted, unchanged. Heart is borderline in size.  IMPRESSION: Endotracheal tube 1.6 cm above the carina.  Bilateral airspace opacities are stable.   Electronically Signed   By: Rolm Baptise M.D.   On: 02/24/2014 14:05    Review of Systems  Unable to perform ROS: intubated   Blood pressure 106/58, pulse 88, temperature 99.4 F (37.4 C), temperature source Core (Comment), resp. rate 23, height _0  (1.702 m), weight 74.1 kg (163 lb 5.8 oz), SpO2 99 %. Physical Exam  Constitutional: No distress.  Eyes: No scleral icterus.  Neck: No JVD present.  Cardiovascular: Normal rate and regular rhythm.   No murmur heard. Respiratory: She has no wheezes.  GI: She exhibits no distension.  Musculoskeletal: She exhibits no edema.    Assessment/Plan: Possible acute on chronic renal failure:. The etiology for her acute renal failure could be secondary to severe prerenal syndrome/ATN. Presently patient is nonoliguric however her BUN and creatinine his increasing. Problem #2 possible underlying chronic renal failure as patient has proteinuria and also her creatinine was slightly high when she was admitted. Ultrasound presently doesn't show hydronephrosis. Problem #3: Anion  gap metabolic acidosis. Patient with high urine and serum ketone bodies. She has also significant hyperglycemia and hemoglobin A1c of 13.2,t his most likely diabetic ketoacidosis. Problem #4 diabetes: Presently there is no history to continue except her blood work. Problem #5 Sepsis: Possibly from her pneumonia Problem #6 hypercalcemia: Possibly from dehydration Problem #7 anemia Problem #8 proteinuria: Seems to be nonnephrotic range. This  could be of from diabetes however other etiology is at this moment cannot be ruled out. Plan: Agree with hydration and increase half-normal saline to 135 mL per hour We'll check ANCA, complement, hepatitis B surface antigen, hepatitis C antibody and ASO titer. We'll check her basic metabolic panel in the morning.   ATNBEFEKADU,Marna Weniger S 02/25/2014, 4:48 PM

## 2014-02-25 NOTE — Progress Notes (Signed)
INITIAL NUTRITION ASSESSMENT  DOCUMENTATION CODES Per approved criteria  -Not Applicable   INTERVENTION:  If unable to wean 24-48 hrs: Initiate Vital 1.2 @ 20 ml/hr via OGT and increase by 10 ml every 4 hours to goal rate of 40 ml/hr.   Add 30 ml ProStat daily   Tube feeding regimen will provide 1252 kcal (81% energy and 100% protein of needs), 87 grams of protein, and 779 ml of H2O.    NUTRITION DIAGNOSIS: Inadequate oral intake related to inability to eat as evidenced by  NPO and respiratory failure   Goal: Pt to meet >/= 90% of their estimated nutrition needs    Monitor:  Respiratory status, nutrition support plans and labs  Reason for Assessment: mechanical ventilation    59 y.o. female  ASSESSMENT: Pt found unresponsive on the floor of her home. Her problems include Diabetic ketoacidosis, sepsis (aspiration pneumonia) acute renal failure, acute respiratory failure. Patient is currently intubated on ventilator support with OGT on low intermittent suction (drainage; brown bile)  MV: 8.7 L/min Temp (24hrs), Avg:97.8 F (36.6 C), Min:93.5 F (34.2 C), Max:100.2 F (37.9 C)  Propofol: 0 ml/hr (Fentanyl: 20 ml/hr)  IVF-D5 NS @ 125 ml/hr   Height: Ht Readings from Last 1 Encounters:  02/24/14 5\' 7"  (1.702 m)    Weight: Wt Readings from Last 1 Encounters:  02/25/14 163 lb 5.8 oz (74.1 kg)    Ideal Body Weight: 135# (61.3 kg)  % Ideal Body Weight: 121%  Wt Readings from Last 10 Encounters:  02/25/14 163 lb 5.8 oz (74.1 kg)    Usual Body Weight: unknown at this time   BMI:  Body mass index is 25.58 kg/(m^2). overweight  Estimated Nutritional Needs: Kcal: 1538 Protein: 81-88 gr Fluid: >1500 ml daily  Skin: no issues noted  Diet Order: Diet NPO time specified  EDUCATION NEEDS: -No education needs identified at this time   Intake/Output Summary (Last 24 hours) at 02/25/14 0949 Last data filed at 02/25/14 0912  Gross per 24 hour  Intake 3303.55 ml   Output   2050 ml  Net 1253.55 ml    Last BM: PTA  Labs:   Recent Labs Lab 02/24/14 2330 02/25/14 0442 02/25/14 0743  NA 140 139 139  K 3.1* 3.6 3.6  CL 114* 114* 113*  CO2 18* 17* 18*  BUN 36* 39* 40*  CREATININE 1.44* 1.73* 1.98*  CALCIUM 9.2 9.1 8.9  GLUCOSE 221* 146* 157*    CBG (last 3)   Recent Labs  02/25/14 0701 02/25/14 0800 02/25/14 0856  GLUCAP 145* 160* 140*    Scheduled Meds: . antiseptic oral rinse  7 mL Mouth Rinse QID  . chlorhexidine  15 mL Mouth Rinse BID  . heparin  5,000 Units Subcutaneous 3 times per day  . ipratropium-albuterol  3 mL Nebulization Q4H  . pantoprazole (PROTONIX) IV  40 mg Intravenous QHS  . piperacillin-tazobactam (ZOSYN)  IV  3.375 g Intravenous Q8H  . sodium chloride  10-40 mL Intracatheter Q12H  . vancomycin  1,000 mg Intravenous Q24H    Continuous Infusions: . sodium chloride Stopped (02/24/14 1811)  . dextrose 5 % and 0.45% NaCl 125 mL/hr at 02/25/14 0912  . fentaNYL infusion INTRAVENOUS 200 mcg/hr (02/25/14 0912)  . insulin (NOVOLIN-R) infusion 6 mL/hr at 02/25/14 0600    No past medical history on file.  No past surgical history on file.  Royann ShiversLynn Cecilee Rosner MS,RD,CSG,LDN Office: [phone] Pager: [phone]

## 2014-02-25 NOTE — Consult Note (Signed)
Consult requested by: [facility] hospitalist Consult requested for respiratory failure:  HPI: This is a 59 year old who was found unresponsive at home. She has apparently not seem to like herself about 24 hours prior to admission when a family member called her. It is not known how long she was unresponsive. When she got to the emergency department she developed respiratory distress and was intubated and placed on mechanical ventilation. I agree with the likely scenario that she developed diabetic ketoacidosis with encephalopathy from that and had then developed aspiration pneumonia after she became unresponsive. She remains intubated and on a ventilator.  No past medical history on file.   Family History  Problem Relation Age of Onset  . Family history unknown: Yes     History   Social History  . Marital Status: Single    Spouse Name: N/A    Number of Children: N/A  . Years of Education: N/A   Social History Main Topics  . Smoking status: Not on file  . Smokeless tobacco: Not on file  . Alcohol Use: Not on file  . Drug Use: Not on file  . Sexual Activity: Not on file   Other Topics Concern  . Not on file   Social History Narrative  . No narrative on file     ROS: Unobtainable    Objective: Vital signs in last 24 hours: Temp:  [93.5 F (34.2 C)-100.2 F (37.9 C)] 100.2 F (37.9 C) (01/18 0400) Pulse Rate:  [90-113] 92 (01/18 0800) Resp:  [14-32] 15 (01/18 0800) BP: (99-145)/(50-91) 113/54 mmHg (01/18 0800) SpO2:  [93 %-100 %] 98 % (01/18 0800) FiO2 (%):  [40 %-100 %] 40 % (01/18 0406) Weight:  [61.236 kg (135 lb)-74.1 kg (163 lb 5.8 oz)] 74.1 kg (163 lb 5.8 oz) (01/18 0500) Weight change:  Last BM Date:  (pt unable to answer and family unsure)  Intake/Output from previous day: 01/17 0701 - 01/18 0700 In: 2951 [I.V.:2231; NG/GT:20; IV Piggyback:700] Out: 2050 [Urine:2050]  PHYSICAL EXAM She is sedated. She does move. Her pupils are reactive nose and throat are  clear her neck is supple without masses. Her chest shows rhonchi bilaterally and some rales in the bases. Her heart is regular without gallop. Her abdomen is soft without masses bowel sounds present and active. She has trace edema of the extremities. Central nervous system examination is really not able to be assessed.  Lab Results: Basic Metabolic Panel:  Recent Labs  16/10/96 0442 02/25/14 0743  NA 139 139  K 3.6 3.6  CL 114* 113*  CO2 17* 18*  GLUCOSE 146* 157*  BUN 39* 40*  CREATININE 1.73* 1.98*  CALCIUM 9.1 8.9   Liver Function Tests: No results for input(s): AST, ALT, ALKPHOS, BILITOT, PROT, ALBUMIN in the last 72 hours. No results for input(s): LIPASE, AMYLASE in the last 72 hours. No results for input(s): AMMONIA in the last 72 hours. CBC:  Recent Labs  02/24/14 1029 02/24/14 1815 02/25/14 0442  WBC 32.9* 23.3* 23.7*  NEUTROABS 31.0*  --   --   HGB 11.8* 9.6* 9.4*  HCT 36.7 28.8* 27.6*  MCV 96.3 92.0 89.0  PLT 323 268 229   Cardiac Enzymes:  Recent Labs  02/24/14 1029  CKTOTAL 102  TROPONINI <0.03   BNP: No results for input(s): PROBNP in the last 72 hours. D-Dimer: No results for input(s): DDIMER in the last 72 hours. CBG:  Recent Labs  02/25/14 0210 02/25/14 0303 02/25/14 0405 02/25/14 0459 02/25/14 0603 02/25/14  0701  GLUCAP 201* 140* 127* 136* 146* 145*   Hemoglobin A1C: No results for input(s): HGBA1C in the last 72 hours. Fasting Lipid Panel: No results for input(s): CHOL, HDL, LDLCALC, TRIG, CHOLHDL, LDLDIRECT in the last 72 hours. Thyroid Function Tests: No results for input(s): TSH, T4TOTAL, FREET4, T3FREE, THYROIDAB in the last 72 hours. Anemia Panel: No results for input(s): VITAMINB12, FOLATE, FERRITIN, TIBC, IRON, RETICCTPCT in the last 72 hours. Coagulation: No results for input(s): LABPROT, INR in the last 72 hours. Urine Drug Screen: Drugs of Abuse     Component Value Date/Time   LABOPIA NONE DETECTED 02/24/2014 1033    COCAINSCRNUR NONE DETECTED 02/24/2014 1033   LABBENZ NONE DETECTED 02/24/2014 1033   AMPHETMU NONE DETECTED 02/24/2014 1033   THCU POSITIVE* 02/24/2014 1033   LABBARB NONE DETECTED 02/24/2014 1033    Alcohol Level:  Recent Labs  02/24/14 1029  ETH <5   Urinalysis:  Recent Labs  02/24/14 1033  COLORURINE YELLOW  LABSPEC 1.020  PHURINE 5.5  GLUCOSEU >1000*  HGBUR LARGE*  BILIRUBINUR SMALL*  KETONESUR >80*  PROTEINUR 100*  UROBILINOGEN 0.2  NITRITE NEGATIVE  LEUKOCYTESUR NEGATIVE   Misc. Labs:   ABGS:  Recent Labs  02/25/14 0500  PHART 7.271*  PO2ART 61.3*  TCO2 15.9  HCO3 16.5*     MICROBIOLOGY: Recent Results (from the past 240 hour(s))  MRSA PCR Screening     Status: None   Collection Time: 02/24/14  4:13 PM  Result Value Ref Range Status   MRSA by PCR NEGATIVE NEGATIVE Final    Comment:        The GeneXpert MRSA Assay (FDA approved for NASAL specimens only), is one component of a comprehensive MRSA colonization surveillance program. It is not intended to diagnose MRSA infection nor to guide or monitor treatment for MRSA infections.   Culture, blood (routine x 2) Call MD if unable to obtain prior to antibiotics being given     Status: None (Preliminary result)   Collection Time: 02/24/14  6:09 PM  Result Value Ref Range Status   Specimen Description BLOOD RIGHT HAND  Final   Special Requests BOTTLES DRAWN AEROBIC AND ANAEROBIC 6CC  Final   Culture PENDING  Incomplete   Report Status PENDING  Incomplete  Culture, blood (routine x 2) Call MD if unable to obtain prior to antibiotics being given     Status: None (Preliminary result)   Collection Time: 02/24/14  6:25 PM  Result Value Ref Range Status   Specimen Description A-LINE  Final   Special Requests   Final    BOTTLES DRAWN AEROBIC AND ANAEROBIC 6CC DRAWN BY RN   Culture PENDING  Incomplete   Report Status PENDING  Incomplete    Studies/Results: Ct Head Wo Contrast  02/24/2014    CLINICAL DATA:  Found on bedroom floor unresponsive; ams  EXAM: CT HEAD WITHOUT CONTRAST  TECHNIQUE: Contiguous axial images were obtained from the base of the skull through the vertex without intravenous contrast.  COMPARISON:  None.  FINDINGS: Diffusely enlarged ventricles and subarachnoid spaces. Patchy white matter low density in both cerebral hemispheres. No skull fracture, intracranial hemorrhage, mass lesion, CT evidence of acute infarction or paranasal sinus air-fluid levels.  IMPRESSION: No acute abnormality. Mild diffuse cerebral and cerebellar atrophy and minimal chronic small vessel white matter ischemic changes in both cerebral hemispheres.   Electronically Signed   By: Gordan PaymentSteve  Reid M.D.   On: 02/24/2014 11:28   Ct Cervical Spine Wo Contrast  02/24/2014   CLINICAL DATA:  Patient found down and unresponsive.  EXAM: CT CERVICAL SPINE WITHOUT CONTRAST  TECHNIQUE: Multidetector CT imaging of the cervical spine was performed without intravenous contrast. Multiplanar CT image reconstructions were also generated.  COMPARISON:  None.  FINDINGS: There is mild reversal of the normal cervical lordosis. No fracture or malalignment is identified. 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 Lung apices are clear. Gas in soft tissue structures in the upper chest is likely related to vascular access.  IMPRESSION: No acute abnormality.  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   Electronically Signed   By: Drusilla Kanner M.D.   On: 02/24/2014 12:56   Dg Chest Port 1 View  02/25/2014   CLINICAL DATA:  Subsequent encounter for ventilator dependence and endotracheal tube placement.  EXAM: PORTABLE CHEST - 1 VIEW  COMPARISON:  Multiple recent previous exams.  FINDINGS: 0543 hrs. Endotracheal tube tip is 1.3 cm above the  base of the carina. The NG tube passes into the stomach although the distal tip position is not included on the film. Right PICC line tip overlies the distal SVC near the RA junction. Patchy bilateral airspace disease is stable. Cardiopericardial silhouette is at upper limits of normal for size. Telemetry leads overlie the chest.  IMPRESSION: Stable exam. No interval change in the patchy bilateral airspace disease suggesting multi focal pneumonia.   Electronically Signed   By: Kennith Center M.D.   On: 02/25/2014 07:14   Dg Chest Port 1 View  02/24/2014   CLINICAL DATA:  Found on bedroom floor unresponsive; ams  EXAM: PORTABLE CHEST - 1 VIEW  COMPARISON:  None.  FINDINGS: Enlarged cardiac silhouette. Extensive patchy opacity in both lungs. Mildly prominent interstitial markings. No pleural fluid seen. Unremarkable bones.  IMPRESSION: 1. Extensive bilateral airspace opacity, most likely due to pneumonia or aspiration pneumonitis. This does not have the typical appearance of pulmonary edema. Follow-up to resolution is recommended to exclude any underlying masses. 2. Cardiomegaly and probable chronic interstitial lung disease.   Electronically Signed   By: Gordan Payment M.D.   On: 02/24/2014 11:27   Dg Chest Port 1v Same Day  02/24/2014   CLINICAL DATA:  Right PICC line placement  EXAM: PORTABLE CHEST - 1 VIEW SAME DAY  COMPARISON:  02/24/2014  FINDINGS: Right PICC line is in place. The tip is in the lower right atrium approximately 7-8 cm deep to the cavoatrial junction. Patchy bilateral areas of consolidation are again noted, unchanged. Endotracheal tube remains just above the carina, approximately 12 mm above the carina. NG tube is in the stomach.  IMPRESSION: Right PICC line tip in the lower right atrium approximately 7-8 cm deep to the cavoatrial junction.  Endotracheal tube approximately 12 mm above the carina.  Stable patchy bilateral airspace disease/consolidation.   Electronically Signed   By: Charlett Nose  M.D.   On: 02/24/2014 17:49   Dg Chest Port 1v Same Day  02/24/2014   CLINICAL DATA:  ET tube placement  EXAM: PORTABLE CHEST - 1 VIEW SAME DAY  COMPARISON:  02/24/2014  FINDINGS: Endotracheal tube is 1.6 cm above the carina. NG tube enters the stomach. Bilateral lower lobe airspace opacities as well as left upper lobe opacity again noted, unchanged. Heart is borderline in size.  IMPRESSION: Endotracheal tube  1.6 cm above the carina.  Bilateral airspace opacities are stable.   Electronically Signed   By: Charlett Nose M.D.   On: 02/24/2014 14:05    Medications:  Prior to Admission:  No prescriptions prior to admission   Scheduled: . antiseptic oral rinse  7 mL Mouth Rinse QID  . chlorhexidine  15 mL Mouth Rinse BID  . heparin  5,000 Units Subcutaneous 3 times per day  . ipratropium-albuterol  3 mL Nebulization Q4H  . pantoprazole (PROTONIX) IV  40 mg Intravenous QHS  . piperacillin-tazobactam (ZOSYN)  IV  3.375 g Intravenous Q8H  . sodium chloride  10-40 mL Intracatheter Q12H  . vancomycin  1,000 mg Intravenous Q24H   Continuous: . sodium chloride Stopped (02/24/14 1811)  . dextrose 5 % and 0.45% NaCl 125 mL/hr at 02/25/14 0700  . fentaNYL infusion INTRAVENOUS 200 mcg/hr (02/25/14 0800)  . insulin (NOVOLIN-R) infusion 6 mL/hr at 02/25/14 0600   JXB:JYNWGN chloride, acetaminophen, albuterol, artificial tears, fentaNYL, ondansetron (ZOFRAN) IV, sodium chloride  Assesment: She has acute hypoxic respiratory failure. She has sepsis and has aspiration pneumonia as well as diabetic ketoacidosis and encephalopathy. Her blood gas this morning shows that she is still acidemic. Chest x-ray still shows bilateral infiltrates. Her renal function is approximately the same. Active Problems:   Acute encephalopathy   DKA (diabetic ketoacidoses)   Acute respiratory failure with hypoxia   Acute renal failure   Sepsis with acute organ dysfunction   Aspiration pneumonia   Metabolic acidosis    Sepsis    Plan: No opportunity for weaning now. Discussed with family at bedside    LOS: 1 day   Nicholos Aloisi L 02/25/2014, 8:19 AM

## 2014-02-26 ENCOUNTER — Inpatient Hospital Stay (HOSPITAL_COMMUNITY): Payer: Self-pay

## 2014-02-26 LAB — CBC
HCT: 25.5 % — ABNORMAL LOW (ref 36.0–46.0)
Hemoglobin: 8.8 g/dL — ABNORMAL LOW (ref 12.0–15.0)
MCH: 31.7 pg (ref 26.0–34.0)
MCHC: 34.5 g/dL (ref 30.0–36.0)
MCV: 91.7 fL (ref 78.0–100.0)
Platelets: 175 10*3/uL (ref 150–400)
RBC: 2.78 MIL/uL — AB (ref 3.87–5.11)
RDW: 14.9 % (ref 11.5–15.5)
WBC: 24.2 10*3/uL — AB (ref 4.0–10.5)

## 2014-02-26 LAB — BLOOD GAS, ARTERIAL
Acid-base deficit: 12.2 mmol/L — ABNORMAL HIGH (ref 0.0–2.0)
Acid-base deficit: 11 mmol/L — ABNORMAL HIGH (ref 0.0–2.0)
BICARBONATE: 15.6 meq/L — AB (ref 20.0–24.0)
Bicarbonate: 14.6 mEq/L — ABNORMAL LOW (ref 20.0–24.0)
DRAWN BY: 234301
Drawn by: 23534
FIO2: 40 %
FIO2: 0.4 %
LHR: 15 {breaths}/min
MECHVT: 450 mL
MECHVT: 450 mL
O2 Content: 40 L/min
O2 SAT: 98.7 %
O2 Saturation: 98.6 %
PATIENT TEMPERATURE: 37
PATIENT TEMPERATURE: 37
PEEP: 5 cmH2O
PEEP: 5 cmH2O
RATE: 15 resp/min
TCO2: 13.6 mmol/L (ref 0–100)
TCO2: 15.4 mmol/L (ref 0–100)
pCO2 arterial: 41.4 mmHg (ref 35.0–45.0)
pCO2 arterial: 42 mmHg (ref 35.0–45.0)
pH, Arterial: 7.174 — CL (ref 7.350–7.450)
pH, Arterial: 7.194 — CL (ref 7.350–7.450)
pO2, Arterial: 159 mmHg — ABNORMAL HIGH (ref 80.0–100.0)
pO2, Arterial: 148 mmHg — ABNORMAL HIGH (ref 80.0–100.0)

## 2014-02-26 LAB — COMPREHENSIVE METABOLIC PANEL
ALBUMIN: 1.7 g/dL — AB (ref 3.5–5.2)
ALT: 19 U/L (ref 0–35)
AST: 21 U/L (ref 0–37)
Alkaline Phosphatase: 151 U/L — ABNORMAL HIGH (ref 39–117)
Anion gap: 9 (ref 5–15)
BILIRUBIN TOTAL: 0.6 mg/dL (ref 0.3–1.2)
BUN: 55 mg/dL — AB (ref 6–23)
CALCIUM: 8.6 mg/dL (ref 8.4–10.5)
CO2: 16 mmol/L — AB (ref 19–32)
CREATININE: 3.38 mg/dL — AB (ref 0.50–1.10)
Chloride: 111 mEq/L (ref 96–112)
GFR calc Af Amer: 16 mL/min — ABNORMAL LOW (ref >90)
GFR calc non Af Amer: 14 mL/min — ABNORMAL LOW (ref >90)
GLUCOSE: 349 mg/dL — AB (ref 70–99)
Potassium: 3.5 mmol/L (ref 3.5–5.1)
SODIUM: 136 mmol/L (ref 135–145)
Total Protein: 5.6 g/dL — ABNORMAL LOW (ref 6.0–8.3)

## 2014-02-26 LAB — MPO/PR-3 (ANCA) ANTIBODIES: Serine Protease 3: <1

## 2014-02-26 LAB — GLUCOSE, CAPILLARY
GLUCOSE-CAPILLARY: 137 mg/dL — AB (ref 70–99)
GLUCOSE-CAPILLARY: 135 mg/dL — AB (ref 70–99)
Glucose-Capillary: 295 mg/dL — ABNORMAL HIGH (ref 70–99)
Glucose-Capillary: 315 mg/dL — ABNORMAL HIGH (ref 70–99)
Glucose-Capillary: 245 mg/dL — ABNORMAL HIGH (ref 70–99)
Glucose-Capillary: 291 mg/dL — ABNORMAL HIGH (ref 70–99)
Glucose-Capillary: 245 mg/dL — ABNORMAL HIGH (ref 70–99)
Glucose-Capillary: 202 mg/dL — ABNORMAL HIGH (ref 70–99)
Glucose-Capillary: 189 mg/dL — ABNORMAL HIGH (ref 70–99)
Glucose-Capillary: 172 mg/dL — ABNORMAL HIGH (ref 70–99)
Glucose-Capillary: 131 mg/dL — ABNORMAL HIGH (ref 70–99)
Glucose-Capillary: 148 mg/dL — ABNORMAL HIGH (ref 70–99)
Glucose-Capillary: 152 mg/dL — ABNORMAL HIGH (ref 70–99)
Glucose-Capillary: 137 mg/dL — ABNORMAL HIGH (ref 70–99)
Glucose-Capillary: 138 mg/dL — ABNORMAL HIGH (ref 70–99)
Glucose-Capillary: 179 mg/dL — ABNORMAL HIGH (ref 70–99)
Glucose-Capillary: 210 mg/dL — ABNORMAL HIGH (ref 70–99)
Glucose-Capillary: 215 mg/dL — ABNORMAL HIGH (ref 70–99)

## 2014-02-26 LAB — LEGIONELLA ANTIGEN, URINE

## 2014-02-26 LAB — HEPATITIS C ANTIBODY: HCV AB: NEGATIVE

## 2014-02-26 LAB — PROTEIN, URINE, 24 HOUR
COLLECTION INTERVAL-UPROT: 24 h
PROTEIN 24H UR: 978 mg/d — AB (ref 50–100)
PROTEIN, URINE: 230 mg/dL
Urine Total Volume-UPROT: 425 mL

## 2014-02-26 LAB — HIV ANTIBODY (ROUTINE TESTING W REFLEX): HIV-1/HIV-2 Ab: NONREACTIVE

## 2014-02-26 LAB — ANA: Anti Nuclear Antibody(ANA): NEGATIVE

## 2014-02-26 LAB — BASIC METABOLIC PANEL
Anion gap: 9 (ref 5–15)
BUN: 59 mg/dL — ABNORMAL HIGH (ref 6–23)
CALCIUM: 8.4 mg/dL (ref 8.4–10.5)
CHLORIDE: 112 meq/L (ref 96–112)
CO2: 17 mmol/L — ABNORMAL LOW (ref 19–32)
CREATININE: 3.77 mg/dL — AB (ref 0.50–1.10)
GFR, EST AFRICAN AMERICAN: 14 mL/min — AB (ref >90)
GFR, EST NON AFRICAN AMERICAN: 12 mL/min — AB (ref >90)
Glucose, Bld: 159 mg/dL — ABNORMAL HIGH (ref 70–99)
POTASSIUM: 3.3 mmol/L — AB (ref 3.5–5.1)
Sodium: 138 mmol/L (ref 135–145)

## 2014-02-26 LAB — HEPATITIS B SURFACE ANTIGEN: Hepatitis B Surface Ag: NEGATIVE

## 2014-02-26 MED ORDER — MIDAZOLAM HCL 2 MG/2ML IJ SOLN
1.0000 mg | INTRAMUSCULAR | Status: DC | PRN
  Administered 2014-02-26 – 2014-02-27 (×5): 1 mg via INTRAVENOUS
  Filled 2014-02-26 (×5): qty 2

## 2014-02-26 MED ORDER — POTASSIUM CHLORIDE 10 MEQ/100ML IV SOLN
10.0000 meq | INTRAVENOUS | Status: AC
  Administered 2014-02-26 (×2): 10 meq via INTRAVENOUS
  Filled 2014-02-26: qty 100

## 2014-02-26 MED ORDER — VANCOMYCIN HCL IN DEXTROSE 1-5 GM/200ML-% IV SOLN
1000.0000 mg | INTRAVENOUS | Status: DC
  Administered 2014-02-27: 1000 mg via INTRAVENOUS
  Filled 2014-02-26 (×2): qty 200

## 2014-02-26 MED ORDER — FENTANYL CITRATE 0.05 MG/ML IJ SOLN
INTRAMUSCULAR | Status: AC
  Filled 2014-02-26: qty 50

## 2014-02-26 MED ORDER — CLOTRIMAZOLE 1 % VA CREA
1.0000 | TOPICAL_CREAM | Freq: Every day | VAGINAL | Status: DC
  Administered 2014-02-26 – 2014-02-28 (×3): 1 via VAGINAL
  Filled 2014-02-26 (×2): qty 45

## 2014-02-26 MED ORDER — INSULIN REGULAR HUMAN 100 UNIT/ML IJ SOLN
INTRAMUSCULAR | Status: DC
  Administered 2014-02-26: 2.6 [IU]/h via INTRAVENOUS
  Filled 2014-02-26 (×3): qty 2.5

## 2014-02-26 MED ORDER — SODIUM CHLORIDE 0.9 % IV SOLN: INTRAVENOUS | Status: DC

## 2014-02-26 MED ORDER — SODIUM CHLORIDE 0.9 % IV SOLN
INTRAVENOUS | Status: DC
Start: 2014-02-26 — End: 2014-02-26
  Administered 2014-02-26: 07:00:00 via INTRAVENOUS

## 2014-02-26 MED ORDER — FUROSEMIDE 10 MG/ML IJ SOLN
80.0000 mg | Freq: Once | INTRAMUSCULAR | Status: AC
  Administered 2014-02-26: 80 mg via INTRAVENOUS
  Filled 2014-02-26: qty 8

## 2014-02-26 MED ORDER — SODIUM BICARBONATE 8.4 % IV SOLN
INTRAVENOUS | Status: DC
  Administered 2014-02-26 – 2014-02-27 (×3): via INTRAVENOUS
  Filled 2014-02-26 (×6): qty 1000

## 2014-02-26 MED ORDER — MICONAZOLE NITRATE 2 % VA CREA: 1.0000 | TOPICAL_CREAM | Freq: Every day | VAGINAL | Status: DC

## 2014-02-26 MED ORDER — DEXTROSE-NACL 5-0.45 % IV SOLN: INTRAVENOUS | Status: DC

## 2014-02-26 NOTE — Progress Notes (Signed)
24 HR URINE SENT TO LAB. PT CONTINUES TO HAVE LOW URINARY OUTPUT. [name] NOTIFIED EARLIER TODAY AFTER FOLEY CATH IRRIGATED TO ASSURE PATENCY.

## 2014-02-26 NOTE — Progress Notes (Signed)
PAtient now with gap closed and bic 19 and has gotten lantus > 2h ago. Still on d5 half normal at 16925ml/h. Sugars now 200s  Plan Reduce d5 half normal to kvo Continue to monitor  Dr. Kalman ShanMurali Trentin Knappenberger, M.D., [facility].C.C.P Pulmonary and Critical Care Medicine Staff Physician [facility] Pager: [phone], If no answer or between  15:00h - 7:00h: call [phone]  02/26/2014 4:19 AM

## 2014-02-26 NOTE — Progress Notes (Addendum)
[facility] PROGRESS NOTE  Jackie BannisterLula D Fisher ZOX:[id number] DOB: 1955-03-12 DOA: 02/24/2014 PCP: No primary care provider on file.  Summary:  This is a 59 year old female who was found unresponsive at her home by family member. It is unknown how long she was on the floor. She presents to the hospital with hypothermia, encephalopathy, acute renal failure and evidence of pneumonia. She was intubated in the emergency room for respiratory distress and airway protection. She is continued on antibiotics for pneumonia being followed by pulmonology. Noted to be in DKA on admission and started on insulin infusion. Fortunately, her renal function has continued to deteriorate. Nephrology is following. She is currently on IV hydration. Will await further recommendations from nephrology. If the patient's overall condition continues to decline, her family wants to consider transfer to a higher level of care.   Assessment/Plan: 1. Sepsis secondary to pneumonia. Continue IV fluids and antibiotics. Blood cultures have shown no growth thus far. Blood pressure is currently stable. Hypothermia has resolved. Leukocytosis persists. She is not having any diarrhea. Will continue current treatments and monitor. 2. Diabetic ketoacidosis. Anion gap when adjusted for albumin is approximately 14. Continue with IV insulin infusion and fluids. Hemoglobin A1c is 13.2. New diagnosis of diabetes. 3. Aspiration pneumonia. Likely developed after she became unresponsive. Continue antibiotics. 4. Acute renal failure, now oliguric. Initially felt to be related to dehydration. Renal ultrasound does not show any hydronephrosis. Despite adequate hydration, her creatinine has continued to rise. Nephrology is following. Further work up is underway including 24 hour urine for protein, ANA, ANCA, complements and ASO titers. Continue with IV hydration. Recommended one dose of lasix to help improve urine output 5. Acute respiratory failure  requiring intubation and mechanical ventilation. Appreciate pulmonology assistance. We'll conduct daily weaning trials. 6. Acute encephalopathy. Related to severe sepsis and dehydration. CT head did not show any acute findings. She is now opening her eyes to voice and is much more responsive during wakeup assessments. We'll need to reassess her neurologic exam once she is extubated. 7. Severe protein calorie malnutrition 8. Metabolic acidosis. Multifactorial, related to renal failure, DKA. Started on bicarbonate infusion per nephrology.  Code Status: Full code Family Communication: discussed with family at the bedside Disposition Plan: discharge home once improved   Consultants:  Pulmonology  nephrology  Procedures:  Intubation 1/17>>  Antibiotics:  Vancomycin 1/17  Zosyn 1/17  HPI/Subjective: Patient is sedated and intubated. Opens her eyes to voice, but then falls back asleep  Objective: Filed Vitals:   02/26/14 1015  BP: 98/47  Pulse: 81  Temp:   Resp: 12    Intake/Output Summary (Last 24 hours) at 02/26/14 1037 Last data filed at 02/26/14 1000  Gross per 24 hour  Intake 4251.79 ml  Output    300 ml  Net 3951.79 ml   Filed Weights   02/24/14 1616 02/25/14 0500 02/26/14 0500  Weight: 72.3 kg (159 lb 6.3 oz) 74.1 kg (163 lb 5.8 oz) 79.8 kg (175 lb 14.8 oz)    Exam:   General:  Sedated on ventilator  Cardiovascular: S1, S2 RRR  Respiratory: rhonchi bilaterally  Abdomen: soft, nt, nd, bs+  Musculoskeletal: 1+ edema b/l   Data Reviewed: Basic Metabolic Panel:  Recent Labs Lab 02/24/14 2330 02/25/14 0442 02/25/14 0743 02/25/14 1136 02/26/14 0545  NA 140 139 139 138 136  K 3.1* 3.6 3.6 3.8 3.5  CL 114* 114* 113* 113* 111  CO2 18* 17* 18* 19 16*  GLUCOSE 221* 146* 157* 138* 349*  BUN 36* 39* 40* 43* 55*  CREATININE 1.44* 1.73* 1.98* 2.25* 3.38*  CALCIUM 9.2 9.1 8.9 8.8 8.6   Liver Function Tests:  Recent Labs Lab 02/26/14 0545  AST 21   ALT 19  ALKPHOS 151*  BILITOT 0.6  PROT 5.6*  ALBUMIN 1.7*   No results for input(s): LIPASE, AMYLASE in the last 168 hours. No results for input(s): AMMONIA in the last 168 hours. CBC:  Recent Labs Lab 02/24/14 1029 02/24/14 1815 02/25/14 0442 02/26/14 0545  WBC 32.9* 23.3* 23.7* 24.2*  NEUTROABS 31.0*  --   --   --   HGB 11.8* 9.6* 9.4* 8.8*  HCT 36.7 28.8* 27.6* 25.5*  MCV 96.3 92.0 89.0 91.7  PLT 323 268 229 175   Cardiac Enzymes:  Recent Labs Lab 02/24/14 1029 02/25/14 0442  CKTOTAL 102 32  TROPONINI <0.03  --    BNP (last 3 results) No results for input(s): PROBNP in the last 8760 hours. CBG:  Recent Labs Lab 02/26/14 0359 02/26/14 0649 02/26/14 0814 02/26/14 0913 02/26/14 1017  GLUCAP 295* 315* 291* 245* 245*    Recent Results (from the past 240 hour(s))  MRSA PCR Screening     Status: None   Collection Time: 02/24/14  4:13 PM  Result Value Ref Range Status   MRSA by PCR NEGATIVE NEGATIVE Final    Comment:        The GeneXpert MRSA Assay (FDA approved for NASAL specimens only), is one component of a comprehensive MRSA colonization surveillance program. It is not intended to diagnose MRSA infection nor to guide or monitor treatment for MRSA infections.   Culture, blood (routine x 2) Call MD if unable to obtain prior to antibiotics being given     Status: None (Preliminary result)   Collection Time: 02/24/14  6:09 PM  Result Value Ref Range Status   Specimen Description BLOOD RIGHT HAND  Final   Special Requests BOTTLES DRAWN AEROBIC AND ANAEROBIC 6CC  Final   Culture NO GROWTH 2 DAYS  Final   Report Status PENDING  Incomplete  Culture, blood (routine x 2) Call MD if unable to obtain prior to antibiotics being given     Status: None (Preliminary result)   Collection Time: 02/24/14  6:25 PM  Result Value Ref Range Status   Specimen Description A-LINE  Final   Special Requests   Final    BOTTLES DRAWN AEROBIC AND ANAEROBIC 6CC DRAWN BY RN    Culture NO GROWTH 2 DAYS  Final   Report Status PENDING  Incomplete  Culture, respiratory (NON-Expectorated)     Status: None (Preliminary result)   Collection Time: 02/24/14 11:35 PM  Result Value Ref Range Status   Specimen Description SPUTUM  Final   Special Requests NONE  Final   Gram Stain PENDING  Incomplete   Culture   Final    Culture reincubated for better growth Performed at [facility]    Report Status PENDING  Incomplete     Studies: Ct Head Wo Contrast  02/24/2014   CLINICAL DATA:  Found on bedroom floor unresponsive; ams  EXAM: CT HEAD WITHOUT CONTRAST  TECHNIQUE: Contiguous axial images were obtained from the base of the skull through the vertex without intravenous contrast.  COMPARISON:  None.  FINDINGS: Diffusely enlarged ventricles and subarachnoid spaces. Patchy white matter low density in both cerebral hemispheres. No skull fracture, intracranial hemorrhage, mass lesion, CT evidence of acute infarction or paranasal sinus air-fluid levels.  IMPRESSION: No acute abnormality.  Mild diffuse cerebral and cerebellar atrophy and minimal chronic small vessel white matter ischemic changes in both cerebral hemispheres.   Electronically Signed   By: Gordan Payment M.D.   On: 02/24/2014 11:28   Ct Cervical Spine Wo Contrast  02/24/2014   CLINICAL DATA:  Patient found down and unresponsive.  EXAM: CT CERVICAL SPINE WITHOUT CONTRAST  TECHNIQUE: Multidetector CT imaging of the cervical spine was performed without intravenous contrast. Multiplanar CT image reconstructions were also generated.  COMPARISON:  None.  FINDINGS: There is mild reversal of the normal cervical lordosis. No fracture or malalignment is identified. 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 Lung apices are clear. Gas in soft tissue structures in the upper chest is likely related to vascular access.   IMPRESSION: No acute abnormality.  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   Electronically Signed   By: Drusilla Kanner M.D.   On: 02/24/2014 12:56   US Renal  02/25/2014   CLINICAL DATA:  Acute renal failure, increased BUN and creatinine  EXAM: RENAL/URINARY TRACT ULTRASOUND COMPLETE  COMPARISON:  None.  FINDINGS: Right Kidney:  Length: 12.0 cm. Echogenicity within normal limits. No mass or hydronephrosis visualized. Small amount of perinephric fluid noted lower pole.  Left Kidney:  Length: 13.2 cm. Echogenicity within normal limits. No mass or hydronephrosis visualized.  Bladder:  There is a Foley catheter within urinary bladder. Some debris noted within dependent gallbladder.  IMPRESSION: No hydronephrosis. No diagnostic renal calculus. Small perinephric fluid lower pole of the right kidney. Foley catheter noted within a decompressed urinary bladder. Probable small amount of debris within bladder.   Electronically Signed   By: Natasha Mead M.D.   On: 02/25/2014 13:24   Dg Chest Port 1 View  02/26/2014   CLINICAL DATA:  Ventilation.  EXAM: PORTABLE CHEST - 1 VIEW  COMPARISON:  None.  FINDINGS: Tracheostomy tube, NG tube, right PICC line in stable position. Cardiomegaly with normal pulmonary vascularity. Bilateral patchy pulmonary infiltrates. Small left pleural effusion cannot be excluded. No pneumothorax. No acute osseous abnormality.  IMPRESSION: 1. Lines and tubes in stable position. 2. Persistent bilateral dense pulmonary infiltrates most likely secondary pneumonia. No interim change. Small left pleural effusion. 3. Stable cardiomegaly.  Normal pulmonary vascularity.   Electronically Signed   By: Maisie Fus  Register   On: 02/26/2014 07:26   Dg Chest Port 1 View  02/25/2014   CLINICAL DATA:  Subsequent encounter for ventilator dependence and endotracheal tube placement.  EXAM: PORTABLE CHEST - 1 VIEW   COMPARISON:  Multiple recent previous exams.  FINDINGS: 0543 hrs. Endotracheal tube tip is 1.3 cm above the base of the carina. The NG tube passes into the stomach although the distal tip position is not included on the film. Right PICC line tip overlies the distal SVC near the RA junction. Patchy bilateral airspace disease is stable. Cardiopericardial silhouette is at upper limits of normal for size. Telemetry leads overlie the chest.  IMPRESSION: Stable exam. No interval change in the patchy bilateral airspace disease suggesting multi focal pneumonia.   Electronically Signed   By: Kennith Center M.D.   On: 02/25/2014 07:14   Dg Chest Port 1 View  02/24/2014   CLINICAL DATA:  Found on bedroom floor unresponsive; ams  EXAM: PORTABLE CHEST - 1 VIEW  COMPARISON:  None.  FINDINGS: Enlarged cardiac silhouette. Extensive patchy opacity in both lungs. Mildly prominent interstitial markings. No pleural fluid seen. Unremarkable bones.  IMPRESSION: 1. Extensive bilateral airspace opacity, most likely due to pneumonia or aspiration pneumonitis. This does not have the typical appearance of pulmonary edema. Follow-up to resolution is recommended to exclude any underlying masses. 2. Cardiomegaly and probable chronic interstitial lung disease.   Electronically Signed   By: Gordan Payment M.D.   On: 02/24/2014 11:27   Dg Chest Port 1v Same Day  02/24/2014   CLINICAL DATA:  Right PICC line placement  EXAM: PORTABLE CHEST - 1 VIEW SAME DAY  COMPARISON:  02/24/2014  FINDINGS: Right PICC line is in place. The tip is in the lower right atrium approximately 7-8 cm deep to the cavoatrial junction. Patchy bilateral areas of consolidation are again noted, unchanged. Endotracheal tube remains just above the carina, approximately 12 mm above the carina. NG tube is in the stomach.  IMPRESSION: Right PICC line tip in the lower right atrium approximately 7-8 cm deep to the cavoatrial junction.  Endotracheal tube approximately 12 mm above the  carina.  Stable patchy bilateral airspace disease/consolidation.   Electronically Signed   By: Charlett Nose M.D.   On: 02/24/2014 17:49   Dg Chest Port 1v Same Day  02/24/2014   CLINICAL DATA:  ET tube placement  EXAM: PORTABLE CHEST - 1 VIEW SAME DAY  COMPARISON:  02/24/2014  FINDINGS: Endotracheal tube is 1.6 cm above the carina. NG tube enters the stomach. Bilateral lower lobe airspace opacities as well as left upper lobe opacity again noted, unchanged. Heart is borderline in size.  IMPRESSION: Endotracheal tube 1.6 cm above the carina.  Bilateral airspace opacities are stable.   Electronically Signed   By: Charlett Nose M.D.   On: 02/24/2014 14:05    Scheduled Meds: . antiseptic oral rinse  7 mL Mouth Rinse QID  . chlorhexidine  15 mL Mouth Rinse BID  . clotrimazole  1 Applicatorful Vaginal QHS  . furosemide  80 mg Intravenous Once  . heparin  5,000 Units Subcutaneous 3 times per day  . ipratropium-albuterol  3 mL Nebulization Q4H  . pantoprazole (PROTONIX) IV  40 mg Intravenous QHS  . piperacillin-tazobactam (ZOSYN)  IV  3.375 g Intravenous Q8H  . sodium chloride  10-40 mL Intracatheter Q12H  . vancomycin  1,000 mg Intravenous Q24H   Continuous Infusions: . dextrose 5 % 1,000 mL with sodium bicarbonate 75 mEq infusion    . fentaNYL infusion INTRAVENOUS 250 mcg/hr (02/26/14 0900)  . insulin (NOVOLIN-R) infusion 5.6 Units/hr (02/26/14 0915)    Active Problems:   Acute encephalopathy   DKA (diabetic ketoacidoses)   Acute respiratory failure with hypoxia   Acute renal failure   Sepsis with acute organ dysfunction   Aspiration pneumonia   Metabolic acidosis   Sepsis    Time spent:    Danesha Kirchoff  [facility] Pager [phone]. If 7PM-7AM, please contact night-coverage at [email or web address], password [facility] 02/26/2014, 10:37 AM  LOS: 2 days

## 2014-02-26 NOTE — Progress Notes (Signed)
Inpatient Diabetes Program Recommendations  AACE/ADA: New Consensus Statement on Inpatient Glycemic Control (2013)  Target Ranges:  Prepandial:   less than 140 mg/dL      Peak postprandial:   less than 180 mg/dL (1-2 hours)      Critically ill patients:  140 - 180 mg/dL   Results for Jackie Fisher, Jackie Fisher (MRN [id number]) as of 02/26/2014 07:30  Ref. Range 02/25/2014 22:03 02/25/2014 22:44 02/25/2014 23:42 02/26/2014 03:59 02/26/2014 06:49  Glucose-Capillary Latest Range: 70-99 mg/dL 409137 (H) 811134 (H) 914145 (H) 295 (H) 315 (H)    Diabetes history: No Outpatient Diabetes medications: NA Current orders for Inpatient glycemic control: IV insulin per GlucoStablizer  Inpatient Diabetes Program Recommendations Insulin - IV drip/GlucoStabilizer: Noted patient is to be restarted on IV insulin this morning. Noted CBG increased back up to 349 mg/dl this morning after receiving 30 units of Lantus last night when patient was originally transitioned off insulin drip.  Noted patient was on very high drip rates to maintain glucose when she was on the insulin drip. Recommend to leave pt on IV insulin for at least 24 hrs to better assess how much insulin she will actually require to maintain glucose in target ranges.  Thanks,  Christena DeemShannon Leyani Gargus RN, MSN, [facility] Inpatient Diabetes Coordinator Team Pager [phone]

## 2014-02-26 NOTE — Progress Notes (Signed)
Subjective: She remains intubated and on the ventilator. She is more alert.  Objective: Vital signs in last 24 hours: Temp:  [97.9 F (36.6 C)-100.2 F (37.9 C)] 97.9 F (36.6 C) (01/19 0400) Pulse Rate:  [77-92] 82 (01/19 0700) Resp:  [12-23] 20 (01/19 0700) BP: (95-132)/(48-69) 101/53 mmHg (01/19 0700) SpO2:  [98 %-100 %] 100 % (01/19 0747) FiO2 (%):  [40 %] 40 % (01/19 0747) Weight:  [79.8 kg (175 lb 14.8 oz)] 79.8 kg (175 lb 14.8 oz) (01/19 0500) Weight change: 18.564 kg (40 lb 14.8 oz) Last BM Date:  (UNKNOWN)  Intake/Output from previous day: 01/18 0701 - 01/19 0700 In: 3928.4 [I.V.:3578.4; IV Piggyback:350] Out: 300 [Urine:300]  PHYSICAL EXAM General appearance: Intubated sedated on mechanical ventilation Resp: clear to auscultation bilaterally Cardio: regular rate and rhythm, S1, S2 normal, no murmur, click, rub or gallop GI: soft, non-tender; bowel sounds normal; no masses,  no organomegaly Extremities: Trace edema  Lab Results:  Results for orders placed or performed during the hospital encounter of 02/24/14 (from the past 48 hour(s))  Basic metabolic panel     Status: Abnormal   Collection Time: 02/24/14 10:29 AM  Result Value Ref Range   Sodium 135 135 - 145 mmol/L    Comment: Please note change in reference range.   Potassium 3.7 3.5 - 5.1 mmol/L    Comment: Please note change in reference range.   Chloride 99 96 - 112 mEq/L   CO2 7 (LL) 19 - 32 mmol/L    Comment: REPEATED TO VERIFY CRITICAL RESULT CALLED TO, READ BACK BY AND VERIFIED WITH: HUNNICUTT C. AT [id number] ON [id number] BY THOMPSON S.    Glucose, Bld 549 (H) 70 - 99 mg/dL   BUN 37 (H) 6 - 23 mg/dL   Creatinine, Ser 1.83 (H) 0.50 - 1.10 mg/dL   Calcium 10.8 (H) 8.4 - 10.5 mg/dL   GFR calc non Af Amer 29 (L) >90 mL/min   GFR calc Af Amer 34 (L) >90 mL/min    Comment: (NOTE) The eGFR has been calculated using the CKD EPI equation. This calculation has not been validated in all clinical  situations. eGFR's persistently <90 mL/min signify possible Chronic Kidney Disease.    Anion gap 29 (H) 5 - 15  CBC with Differential     Status: Abnormal   Collection Time: 02/24/14 10:29 AM  Result Value Ref Range   WBC 32.9 (H) 4.0 - 10.5 K/uL   RBC 3.81 (L) 3.87 - 5.11 MIL/uL   Hemoglobin 11.8 (L) 12.0 - 15.0 g/dL   HCT 36.7 36.0 - 46.0 %   MCV 96.3 78.0 - 100.0 fL   MCH 31.0 26.0 - 34.0 pg   MCHC 32.2 30.0 - 36.0 g/dL   RDW 14.3 11.5 - 15.5 %   Platelets 323 150 - 400 K/uL   Neutrophils Relative % 94 (H) 43 - 77 %   Neutro Abs 31.0 (H) 1.7 - 7.7 K/uL   Lymphocytes Relative 2 (L) 12 - 46 %   Lymphs Abs 0.8 0.7 - 4.0 K/uL   Monocytes Relative 3 3 - 12 %   Monocytes Absolute 1.0 0.1 - 1.0 K/uL   Eosinophils Relative 0 0 - 5 %   Eosinophils Absolute 0.0 0.0 - 0.7 K/uL   Basophils Relative 0 0 - 1 %   Basophils Absolute 0.1 0.0 - 0.1 K/uL   WBC Morphology SMUDGE CELLS     Comment: MILD LEFT SHIFT (1-5% METAS, OCC MYELO, OCC BANDS)  DOHLE BODIES VACUOLATED NEUTROPHILS    RBC Morphology RARE NRBCs    Smear Review LARGE PLATELETS PRESENT   Troponin I     Status: None   Collection Time: 02/24/14 10:29 AM  Result Value Ref Range   Troponin I <0.03 <0.031 ng/mL    Comment:        NO INDICATION OF MYOCARDIAL INJURY. Please note change in reference range.   Ethanol     Status: None   Collection Time: 02/24/14 10:29 AM  Result Value Ref Range   Alcohol, Ethyl (B) <5 0 - 9 mg/dL    Comment:        LOWEST DETECTABLE LIMIT FOR SERUM ALCOHOL IS 11 mg/dL FOR MEDICAL PURPOSES ONLY   CK     Status: None   Collection Time: 02/24/14 10:29 AM  Result Value Ref Range   Total CK 102 7 - 177 U/L  Urine rapid drug screen (hosp performed)     Status: Abnormal   Collection Time: 02/24/14 10:33 AM  Result Value Ref Range   Opiates NONE DETECTED NONE DETECTED   Cocaine NONE DETECTED NONE DETECTED   Benzodiazepines NONE DETECTED NONE DETECTED   Amphetamines NONE DETECTED NONE  DETECTED   Tetrahydrocannabinol POSITIVE (A) NONE DETECTED   Barbiturates NONE DETECTED NONE DETECTED    Comment:        DRUG SCREEN FOR MEDICAL PURPOSES ONLY.  IF CONFIRMATION IS NEEDED FOR ANY PURPOSE, NOTIFY LAB WITHIN 5 DAYS.        LOWEST DETECTABLE LIMITS FOR URINE DRUG SCREEN Drug Class       Cutoff (ng/mL) Amphetamine      1000 Barbiturate      200 Benzodiazepine   242 Tricyclics       353 Opiates          300 Cocaine          300 THC              50   Urinalysis, Routine w reflex microscopic     Status: Abnormal   Collection Time: 02/24/14 10:33 AM  Result Value Ref Range   Color, Urine YELLOW YELLOW   APPearance CLOUDY (A) CLEAR   Specific Gravity, Urine 1.020 1.005 - 1.030   pH 5.5 5.0 - 8.0   Glucose, UA >1000 (A) NEGATIVE mg/dL   Hgb urine dipstick LARGE (A) NEGATIVE   Bilirubin Urine SMALL (A) NEGATIVE   Ketones, ur >80 (A) NEGATIVE mg/dL   Protein, ur 100 (A) NEGATIVE mg/dL   Urobilinogen, UA 0.2 0.0 - 1.0 mg/dL   Nitrite NEGATIVE NEGATIVE   Leukocytes, UA NEGATIVE NEGATIVE  Urine microscopic-add on     Status: Abnormal   Collection Time: 02/24/14 10:33 AM  Result Value Ref Range   Squamous Epithelial / LPF RARE RARE   WBC, UA 0-2 <3 WBC/hpf   RBC / HPF 21-50 <3 RBC/hpf   Bacteria, UA MANY (A) RARE   Casts GRANULAR CAST (A) NEGATIVE  POC CBG, ED     Status: Abnormal   Collection Time: 02/24/14  1:30 PM  Result Value Ref Range   Glucose-Capillary 437 (H) 70 - 99 mg/dL  Blood gas, arterial     Status: Abnormal   Collection Time: 02/24/14  1:31 PM  Result Value Ref Range   FIO2 110.00 %   Delivery systems VENTILATOR    Mode PRESSURE REGULATED VOLUME CONTROL    VT 450 mL   Rate 15 resp/min   Peep/cpap 5.0 cm  H20   pH, Arterial 7.007 (LL) 7.350 - 7.450    Comment: CRITICAL RESULT CALLED TO, READ BACK BY AND VERIFIED WITH: CINDY BORTZ,RN AT 1349 BY VANESSA LAWSON,RRT,RCP ON 02/24/2014    pCO2 arterial 27.0 (L) 35.0 - 45.0 mmHg   pO2, Arterial 89.7  80.0 - 100.0 mmHg   Bicarbonate 6.4 (L) 20.0 - 24.0 mEq/L   TCO2 6.6 0 - 100 mmol/L   Acid-base deficit 22.5 (H) 0.0 - 2.0 mmol/L   O2 Saturation 93.9 %   Patient temperature 37.0    Collection site RIGHT RADIAL    Drawn by COLLECTED BY RT    Sample type ARTERIAL    Allens test (pass/fail) PASS PASS  CBG monitoring, ED     Status: Abnormal   Collection Time: 02/24/14  3:01 PM  Result Value Ref Range   Glucose-Capillary 476 (H) 70 - 99 mg/dL  Glucose, capillary     Status: Abnormal   Collection Time: 02/24/14  4:04 PM  Result Value Ref Range   Glucose-Capillary 365 (H) 70 - 99 mg/dL   Comment 1 Documented in Chart    Comment 2 Notify RN   MRSA PCR Screening     Status: None   Collection Time: 02/24/14  4:13 PM  Result Value Ref Range   MRSA by PCR NEGATIVE NEGATIVE    Comment:        The GeneXpert MRSA Assay (FDA approved for NASAL specimens only), is one component of a comprehensive MRSA colonization surveillance program. It is not intended to diagnose MRSA infection nor to guide or monitor treatment for MRSA infections.   Glucose, capillary     Status: Abnormal   Collection Time: 02/24/14  5:11 PM  Result Value Ref Range   Glucose-Capillary 395 (H) 70 - 99 mg/dL   Comment 1 Documented in Chart    Comment 2 Notify RN   Influenza panel by pcr     Status: None   Collection Time: 02/24/14  5:50 PM  Result Value Ref Range   Influenza A By PCR NEGATIVE NEGATIVE   Influenza B By PCR NEGATIVE NEGATIVE   H1N1 flu by pcr NOT DETECTED NOT DETECTED    Comment:        The Xpert Flu assay (FDA approved for nasal aspirates or washes and nasopharyngeal swab specimens), is intended as an aid in the diagnosis of influenza and should not be used as a sole basis for treatment.   Strep pneumoniae urinary antigen     Status: None   Collection Time: 02/24/14  5:52 PM  Result Value Ref Range   Strep Pneumo Urinary Antigen NEGATIVE NEGATIVE    Comment: PERFORMED AT [facility]        Infection due to S. pneumoniae cannot be absolutely ruled out since the antigen present may be below the detection limit of the test. Performed at [facility]   Culture, blood (routine x 2) Call MD if unable to obtain prior to antibiotics being given     Status: None (Preliminary result)   Collection Time: 02/24/14  6:09 PM  Result Value Ref Range   Specimen Description BLOOD RIGHT HAND    Special Requests BOTTLES DRAWN AEROBIC AND ANAEROBIC 6CC    Culture NO GROWTH 1 DAY    Report Status PENDING   Glucose, capillary     Status: Abnormal   Collection Time: 02/24/14  6:10 PM  Result Value Ref Range   Glucose-Capillary 248 (H) 70 -  99 mg/dL  Basic metabolic panel (stat then every 4 hours)     Status: Abnormal   Collection Time: 02/24/14  6:15 PM  Result Value Ref Range   Sodium 142 135 - 145 mmol/L    Comment: Please note change in reference range. DELTA CHECK NOTED    Potassium 3.1 (L) 3.5 - 5.1 mmol/L    Comment: Please note change in reference range.   Chloride 114 (H) 96 - 112 mEq/L    Comment: DELTA CHECK NOTED   CO2 15 (L) 19 - 32 mmol/L   Glucose, Bld 273 (H) 70 - 99 mg/dL   BUN 35 (H) 6 - 23 mg/dL   Creatinine, Ser 1.38 (H) 0.50 - 1.10 mg/dL   Calcium 9.1 8.4 - 10.5 mg/dL   GFR calc non Af Amer 41 (L) >90 mL/min   GFR calc Af Amer 48 (L) >90 mL/min    Comment: (NOTE) The eGFR has been calculated using the CKD EPI equation. This calculation has not been validated in all clinical situations. eGFR's persistently <90 mL/min signify possible Chronic Kidney Disease.    Anion gap 13 5 - 15  CBC     Status: Abnormal   Collection Time: 02/24/14  6:15 PM  Result Value Ref Range   WBC 23.3 (H) 4.0 - 10.5 K/uL   RBC 3.13 (L) 3.87 - 5.11 MIL/uL   Hemoglobin 9.6 (L) 12.0 - 15.0 g/dL    Comment: DELTA CHECK NOTED   HCT 28.8 (L) 36.0 - 46.0 %   MCV 92.0 78.0 - 100.0 fL   MCH 30.7 26.0 - 34.0 pg   MCHC 33.3 30.0 - 36.0 g/dL   RDW 14.1 11.5 - 15.5 %    Platelets 268 150 - 400 K/uL  Ketones, qualitative     Status: Abnormal   Collection Time: 02/24/14  6:15 PM  Result Value Ref Range   Acetone, Bld SMALL (A) NEGATIVE  Hemoglobin A1c     Status: Abnormal   Collection Time: 02/24/14  6:15 PM  Result Value Ref Range   Hgb A1c MFr Bld 13.2 (H) <5.7 %    Comment: (NOTE)                                                                       According to the ADA Clinical Practice Recommendations for 2011, when HbA1c is used as a screening test:  >=6.5%   Diagnostic of Diabetes Mellitus           (if abnormal result is confirmed) 5.7-6.4%   Increased risk of developing Diabetes Mellitus References:Diagnosis and Classification of Diabetes Mellitus,Diabetes ZOXW,9604,54(UJWJX 1):S62-S69 and Standards of Medical Care in         Diabetes - 2011,Diabetes Care,2011,34 (Suppl 1):S11-S61.    Mean Plasma Glucose 332 (H) <117 mg/dL    Comment: Performed at [facility]  HIV antibody     Status: None   Collection Time: 02/24/14  6:15 PM  Result Value Ref Range   HIV 1/O/2 Abs-Index Value <1.00 <1.00    Comment: Index Value: Specimen reactivity relative to the negative cutoff.   HIV-1/HIV-2 Ab Non Reactive Non Reactive    Comment: (NOTE) Performed At: [facility] [place], [place]  [id number] Lindon Romp MD QQ:[id number]   Culture, blood (routine x 2) Call MD if unable to obtain prior to antibiotics being given     Status: None (Preliminary result)   Collection Time: 02/24/14  6:25 PM  Result Value Ref Range   Specimen Description A-LINE    Special Requests      BOTTLES DRAWN AEROBIC AND ANAEROBIC 6CC DRAWN BY RN   Culture NO GROWTH 1 DAY    Report Status PENDING   Lactic acid, plasma     Status: None   Collection Time: 02/24/14  6:25 PM  Result Value Ref Range   Lactic Acid, Venous 1.8 0.5 - 2.2 mmol/L  Glucose, capillary     Status: Abnormal   Collection Time: 02/24/14  7:25 PM  Result Value Ref  Range   Glucose-Capillary 229 (H) 70 - 99 mg/dL  Glucose, capillary     Status: Abnormal   Collection Time: 02/24/14  8:18 PM  Result Value Ref Range   Glucose-Capillary 239 (H) 70 - 99 mg/dL  Glucose, capillary     Status: Abnormal   Collection Time: 02/24/14  9:22 PM  Result Value Ref Range   Glucose-Capillary 224 (H) 70 - 99 mg/dL  Glucose, capillary     Status: Abnormal   Collection Time: 02/24/14 11:02 PM  Result Value Ref Range   Glucose-Capillary 223 (H) 70 - 99 mg/dL  Basic metabolic panel (stat then every 4 hours)     Status: Abnormal   Collection Time: 02/24/14 11:30 PM  Result Value Ref Range   Sodium 140 135 - 145 mmol/L    Comment: Please note change in reference range.   Potassium 3.1 (L) 3.5 - 5.1 mmol/L    Comment: Please note change in reference range.   Chloride 114 (H) 96 - 112 mEq/L   CO2 18 (L) 19 - 32 mmol/L   Glucose, Bld 221 (H) 70 - 99 mg/dL   BUN 36 (H) 6 - 23 mg/dL   Creatinine, Ser 1.44 (H) 0.50 - 1.10 mg/dL   Calcium 9.2 8.4 - 10.5 mg/dL   GFR calc non Af Amer 39 (L) >90 mL/min   GFR calc Af Amer 45 (L) >90 mL/min    Comment: (NOTE) The eGFR has been calculated using the CKD EPI equation. This calculation has not been validated in all clinical situations. eGFR's persistently <90 mL/min signify possible Chronic Kidney Disease.    Anion gap 8 5 - 15  Glucose, capillary     Status: Abnormal   Collection Time: 02/25/14 12:06 AM  Result Value Ref Range   Glucose-Capillary 220 (H) 70 - 99 mg/dL  Glucose, capillary     Status: Abnormal   Collection Time: 02/25/14  1:05 AM  Result Value Ref Range   Glucose-Capillary 171 (H) 70 - 99 mg/dL  Glucose, capillary     Status: Abnormal   Collection Time: 02/25/14  2:10 AM  Result Value Ref Range   Glucose-Capillary 201 (H) 70 - 99 mg/dL  Glucose, capillary     Status: Abnormal   Collection Time: 02/25/14  3:03 AM  Result Value Ref Range   Glucose-Capillary 140 (H) 70 - 99 mg/dL  Glucose, capillary      Status: Abnormal   Collection Time: 02/25/14  4:05 AM  Result Value Ref Range   Glucose-Capillary 127 (H) 70 - 99 mg/dL  Basic metabolic panel (stat then every 4 hours)     Status: Abnormal   Collection Time: 02/25/14  4:42  AM  Result Value Ref Range   Sodium 139 135 - 145 mmol/L    Comment: Please note change in reference range.   Potassium 3.6 3.5 - 5.1 mmol/L    Comment: Please note change in reference range.   Chloride 114 (H) 96 - 112 mEq/L   CO2 17 (L) 19 - 32 mmol/L   Glucose, Bld 146 (H) 70 - 99 mg/dL   BUN 39 (H) 6 - 23 mg/dL   Creatinine, Ser 1.73 (H) 0.50 - 1.10 mg/dL   Calcium 9.1 8.4 - 10.5 mg/dL   GFR calc non Af Amer 31 (L) >90 mL/min   GFR calc Af Amer 36 (L) >90 mL/min    Comment: (NOTE) The eGFR has been calculated using the CKD EPI equation. This calculation has not been validated in all clinical situations. eGFR's persistently <90 mL/min signify possible Chronic Kidney Disease.    Anion gap 8 5 - 15  CBC     Status: Abnormal   Collection Time: 02/25/14  4:42 AM  Result Value Ref Range   WBC 23.7 (H) 4.0 - 10.5 K/uL   RBC 3.10 (L) 3.87 - 5.11 MIL/uL   Hemoglobin 9.4 (L) 12.0 - 15.0 g/dL   HCT 27.6 (L) 36.0 - 46.0 %   MCV 89.0 78.0 - 100.0 fL   MCH 30.3 26.0 - 34.0 pg   MCHC 34.1 30.0 - 36.0 g/dL   RDW 13.4 11.5 - 15.5 %   Platelets 229 150 - 400 K/uL  CK     Status: None   Collection Time: 02/25/14  4:42 AM  Result Value Ref Range   Total CK 32 7 - 177 U/L  Glucose, capillary     Status: Abnormal   Collection Time: 02/25/14  4:59 AM  Result Value Ref Range   Glucose-Capillary 136 (H) 70 - 99 mg/dL  Blood gas, arterial     Status: Abnormal   Collection Time: 02/25/14  5:00 AM  Result Value Ref Range   FIO2 0.40 %   Delivery systems VENTILATOR    Mode PRESSURE REGULATED VOLUME CONTROL    VT 450 mL   Rate 15.0 resp/min   Peep/cpap 5.0 cm H20   pH, Arterial 7.271 (L) 7.350 - 7.450   pCO2 arterial 37.2 35.0 - 45.0 mmHg   pO2, Arterial 61.3 (L)  80.0 - 100.0 mmHg   Bicarbonate 16.5 (L) 20.0 - 24.0 mEq/L   TCO2 15.9 0 - 100 mmol/L   Acid-Base Excess 9.0 (H) 0.0 - 2.0 mmol/L   Acid-base deficit 9.1 (H) 0.0 - 2.0 mmol/L   O2 Saturation 91.2 %   Collection site LEFT RADIAL    Drawn by [id number]    Sample type ARTERIAL DRAW    Allens test (pass/fail) PASS PASS  Glucose, capillary     Status: Abnormal   Collection Time: 02/25/14  6:03 AM  Result Value Ref Range   Glucose-Capillary 146 (H) 70 - 99 mg/dL  Glucose, capillary     Status: Abnormal   Collection Time: 02/25/14  7:01 AM  Result Value Ref Range   Glucose-Capillary 145 (H) 70 - 99 mg/dL  Basic metabolic panel (stat then every 4 hours)     Status: Abnormal   Collection Time: 02/25/14  7:43 AM  Result Value Ref Range   Sodium 139 135 - 145 mmol/L    Comment: Please note change in reference range.   Potassium 3.6 3.5 - 5.1 mmol/L    Comment: Please note change in  reference range.   Chloride 113 (H) 96 - 112 mEq/L   CO2 18 (L) 19 - 32 mmol/L   Glucose, Bld 157 (H) 70 - 99 mg/dL   BUN 40 (H) 6 - 23 mg/dL   Creatinine, Ser 1.98 (H) 0.50 - 1.10 mg/dL   Calcium 8.9 8.4 - 10.5 mg/dL   GFR calc non Af Amer 27 (L) >90 mL/min   GFR calc Af Amer 31 (L) >90 mL/min    Comment: (NOTE) The eGFR has been calculated using the CKD EPI equation. This calculation has not been validated in all clinical situations. eGFR's persistently <90 mL/min signify possible Chronic Kidney Disease.    Anion gap 8 5 - 15  Glucose, capillary     Status: Abnormal   Collection Time: 02/25/14  8:00 AM  Result Value Ref Range   Glucose-Capillary 160 (H) 70 - 99 mg/dL  Glucose, capillary     Status: Abnormal   Collection Time: 02/25/14  8:56 AM  Result Value Ref Range   Glucose-Capillary 140 (H) 70 - 99 mg/dL   Comment 1 Notify RN   Glucose, capillary     Status: Abnormal   Collection Time: 02/25/14  9:56 AM  Result Value Ref Range   Glucose-Capillary 146 (H) 70 - 99 mg/dL   Comment 1 Notify RN    Glucose, capillary     Status: Abnormal   Collection Time: 02/25/14 10:57 AM  Result Value Ref Range   Glucose-Capillary 114 (H) 70 - 99 mg/dL  Basic metabolic panel (stat then every 4 hours)     Status: Abnormal   Collection Time: 02/25/14 11:36 AM  Result Value Ref Range   Sodium 138 135 - 145 mmol/L    Comment: Please note change in reference range.   Potassium 3.8 3.5 - 5.1 mmol/L    Comment: Please note change in reference range.   Chloride 113 (H) 96 - 112 mEq/L   CO2 19 19 - 32 mmol/L   Glucose, Bld 138 (H) 70 - 99 mg/dL   BUN 43 (H) 6 - 23 mg/dL   Creatinine, Ser 2.25 (H) 0.50 - 1.10 mg/dL   Calcium 8.8 8.4 - 10.5 mg/dL   GFR calc non Af Amer 23 (L) >90 mL/min   GFR calc Af Amer 26 (L) >90 mL/min    Comment: (NOTE) The eGFR has been calculated using the CKD EPI equation. This calculation has not been validated in all clinical situations. eGFR's persistently <90 mL/min signify possible Chronic Kidney Disease.    Anion gap 6 5 - 15  Glucose, capillary     Status: None   Collection Time: 02/25/14 12:00 PM  Result Value Ref Range   Glucose-Capillary 93 70 - 99 mg/dL  Glucose, capillary     Status: Abnormal   Collection Time: 02/25/14 12:56 PM  Result Value Ref Range   Glucose-Capillary 211 (H) 70 - 99 mg/dL   Comment 1 Notify RN   Glucose, capillary     Status: Abnormal   Collection Time: 02/25/14  2:06 PM  Result Value Ref Range   Glucose-Capillary 172 (H) 70 - 99 mg/dL   Comment 1 Notify RN   Glucose, capillary     Status: Abnormal   Collection Time: 02/25/14  2:55 PM  Result Value Ref Range   Glucose-Capillary 173 (H) 70 - 99 mg/dL  Glucose, capillary     Status: Abnormal   Collection Time: 02/25/14  4:01 PM  Result Value Ref Range   Glucose-Capillary  145 (H) 70 - 99 mg/dL  Glucose, capillary     Status: Abnormal   Collection Time: 02/25/14  5:00 PM  Result Value Ref Range   Glucose-Capillary 152 (H) 70 - 99 mg/dL   Comment 1 Notify RN   Hepatitis B  surface antigen     Status: None   Collection Time: 02/25/14  5:27 PM  Result Value Ref Range   Hepatitis B Surface Ag NEGATIVE NEGATIVE    Comment: Performed at [facility]  Hepatitis C antibody     Status: None   Collection Time: 02/25/14  5:27 PM  Result Value Ref Range   HCV Ab NEGATIVE NEGATIVE    Comment: Performed at [facility]  Glucose, capillary     Status: Abnormal   Collection Time: 02/25/14  6:01 PM  Result Value Ref Range   Glucose-Capillary 158 (H) 70 - 99 mg/dL   Comment 1 Notify RN   Glucose, capillary     Status: Abnormal   Collection Time: 02/25/14  6:53 PM  Result Value Ref Range   Glucose-Capillary 165 (H) 70 - 99 mg/dL   Comment 1 Notify RN   Glucose, capillary     Status: Abnormal   Collection Time: 02/25/14  8:04 PM  Result Value Ref Range   Glucose-Capillary 162 (H) 70 - 99 mg/dL   Comment 1 Notify RN   Glucose, capillary     Status: Abnormal   Collection Time: 02/25/14  8:51 PM  Result Value Ref Range   Glucose-Capillary 167 (H) 70 - 99 mg/dL   Comment 1 Notify RN   Glucose, capillary     Status: Abnormal   Collection Time: 02/25/14 10:03 PM  Result Value Ref Range   Glucose-Capillary 137 (H) 70 - 99 mg/dL   Comment 1 Notify RN   Glucose, capillary     Status: Abnormal   Collection Time: 02/25/14 10:44 PM  Result Value Ref Range   Glucose-Capillary 134 (H) 70 - 99 mg/dL   Comment 1 Notify RN   Glucose, capillary     Status: Abnormal   Collection Time: 02/25/14 11:42 PM  Result Value Ref Range   Glucose-Capillary 145 (H) 70 - 99 mg/dL   Comment 1 Notify RN   Glucose, capillary     Status: Abnormal   Collection Time: 02/26/14  3:59 AM  Result Value Ref Range   Glucose-Capillary 295 (H) 70 - 99 mg/dL   Comment 1 Notify RN   Blood gas, arterial     Status: Abnormal   Collection Time: 02/26/14  4:45 AM  Result Value Ref Range   FIO2 40.00 %   Delivery systems VENTILATOR    Mode PRESSURE REGULATED VOLUME CONTROL    VT 450  mL   Rate 15 resp/min   Peep/cpap 5.0 cm H20   pH, Arterial 7.174 (LL) 7.350 - 7.450    Comment: CRITICAL RESULT CALLED TO, READ BACK BY AND VERIFIED WITH: MIZE,J.RN AT 0504 02/26/14 BY BROADNAX,L.RRT    pCO2 arterial 41.4 35.0 - 45.0 mmHg   pO2, Arterial 159.0 (H) 80.0 - 100.0 mmHg   Bicarbonate 14.6 (L) 20.0 - 24.0 mEq/L   TCO2 13.6 0 - 100 mmol/L   Acid-base deficit 12.2 (H) 0.0 - 2.0 mmol/L   O2 Saturation 98.6 %   Patient temperature 37.0    Collection site LEFT RADIAL    Drawn by [id number]    Sample type ARTERIAL    Allens test (pass/fail) PASS PASS  CBC  Status: Abnormal   Collection Time: 02/26/14  5:45 AM  Result Value Ref Range   WBC 24.2 (H) 4.0 - 10.5 K/uL   RBC 2.78 (L) 3.87 - 5.11 MIL/uL   Hemoglobin 8.8 (L) 12.0 - 15.0 g/dL   HCT 25.5 (L) 36.0 - 46.0 %   MCV 91.7 78.0 - 100.0 fL   MCH 31.7 26.0 - 34.0 pg   MCHC 34.5 30.0 - 36.0 g/dL   RDW 14.9 11.5 - 15.5 %   Platelets 175 150 - 400 K/uL    Comment: DELTA CHECK NOTED  Comprehensive metabolic panel     Status: Abnormal   Collection Time: 02/26/14  5:45 AM  Result Value Ref Range   Sodium 136 135 - 145 mmol/L    Comment: Please note change in reference range.   Potassium 3.5 3.5 - 5.1 mmol/L    Comment: Please note change in reference range.   Chloride 111 96 - 112 mEq/L   CO2 16 (L) 19 - 32 mmol/L   Glucose, Bld 349 (H) 70 - 99 mg/dL   BUN 55 (H) 6 - 23 mg/dL   Creatinine, Ser 3.38 (H) 0.50 - 1.10 mg/dL    Comment: DELTA CHECK NOTED   Calcium 8.6 8.4 - 10.5 mg/dL   Total Protein 5.6 (L) 6.0 - 8.3 g/dL   Albumin 1.7 (L) 3.5 - 5.2 g/dL   AST 21 0 - 37 U/L   ALT 19 0 - 35 U/L   Alkaline Phosphatase 151 (H) 39 - 117 U/L   Total Bilirubin 0.6 0.3 - 1.2 mg/dL   GFR calc non Af Amer 14 (L) >90 mL/min   GFR calc Af Amer 16 (L) >90 mL/min    Comment: (NOTE) The eGFR has been calculated using the CKD EPI equation. This calculation has not been validated in all clinical situations. eGFR's persistently <90  mL/min signify possible Chronic Kidney Disease.    Anion gap 9 5 - 15  Glucose, capillary     Status: Abnormal   Collection Time: 02/26/14  6:49 AM  Result Value Ref Range   Glucose-Capillary 315 (H) 70 - 99 mg/dL   Comment 1 Notify RN     ABGS  Recent Labs  02/26/14 0445  PHART 7.174*  PO2ART 159.0*  TCO2 13.6  HCO3 14.6*   CULTURES Recent Results (from the past 240 hour(s))  MRSA PCR Screening     Status: None   Collection Time: 02/24/14  4:13 PM  Result Value Ref Range Status   MRSA by PCR NEGATIVE NEGATIVE Final    Comment:        The GeneXpert MRSA Assay (FDA approved for NASAL specimens only), is one component of a comprehensive MRSA colonization surveillance program. It is not intended to diagnose MRSA infection nor to guide or monitor treatment for MRSA infections.   Culture, blood (routine x 2) Call MD if unable to obtain prior to antibiotics being given     Status: None (Preliminary result)   Collection Time: 02/24/14  6:09 PM  Result Value Ref Range Status   Specimen Description BLOOD RIGHT HAND  Final   Special Requests BOTTLES DRAWN AEROBIC AND ANAEROBIC 6CC  Final   Culture NO GROWTH 1 DAY  Final   Report Status PENDING  Incomplete  Culture, blood (routine x 2) Call MD if unable to obtain prior to antibiotics being given     Status: None (Preliminary result)   Collection Time: 02/24/14  6:25 PM  Result Value  Ref Range Status   Specimen Description A-LINE  Final   Special Requests   Final    BOTTLES DRAWN AEROBIC AND ANAEROBIC 6CC DRAWN BY RN   Culture NO GROWTH 1 DAY  Final   Report Status PENDING  Incomplete   Studies/Results: Ct Head Wo Contrast  02/24/2014   CLINICAL DATA:  Found on bedroom floor unresponsive; ams  EXAM: CT HEAD WITHOUT CONTRAST  TECHNIQUE: Contiguous axial images were obtained from the base of the skull through the vertex without intravenous contrast.  COMPARISON:  None.  FINDINGS: Diffusely enlarged ventricles and  subarachnoid spaces. Patchy white matter low density in both cerebral hemispheres. No skull fracture, intracranial hemorrhage, mass lesion, CT evidence of acute infarction or paranasal sinus air-fluid levels.  IMPRESSION: No acute abnormality. Mild diffuse cerebral and cerebellar atrophy and minimal chronic small vessel white matter ischemic changes in both cerebral hemispheres.   Electronically Signed   By: Enrique Sack M.D.   On: 02/24/2014 11:28   Ct Cervical Spine Wo Contrast  02/24/2014   CLINICAL DATA:  Patient found down and unresponsive.  EXAM: CT CERVICAL SPINE WITHOUT CONTRAST  TECHNIQUE: Multidetector CT imaging of the cervical spine was performed without intravenous contrast. Multiplanar CT image reconstructions were also generated.  COMPARISON:  None.  FINDINGS: There is mild reversal of the normal cervical lordosis. No fracture or malalignment is identified. 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 Lung apices are clear. Gas in soft tissue structures in the upper chest is likely related to vascular access.  IMPRESSION: No acute abnormality.  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   Electronically Signed   By: Inge Rise M.D.   On: 02/24/2014 12:56   US Renal  02/25/2014   CLINICAL DATA:  Acute renal failure, increased BUN and creatinine  EXAM: RENAL/URINARY TRACT ULTRASOUND COMPLETE  COMPARISON:  None.  FINDINGS: Right Kidney:  Length: 12.0 cm. Echogenicity within normal limits. No mass or hydronephrosis visualized. Small amount of perinephric fluid noted lower pole.  Left Kidney:  Length: 13.2 cm. Echogenicity within normal limits. No mass or hydronephrosis visualized.  Bladder:  There is a Foley catheter within urinary bladder. Some debris noted within dependent gallbladder.  IMPRESSION:  No hydronephrosis. No diagnostic renal calculus. Small perinephric fluid lower pole of the right kidney. Foley catheter noted within a decompressed urinary bladder. Probable small amount of debris within bladder.   Electronically Signed   By: Lahoma Crocker M.D.   On: 02/25/2014 13:24   Dg Chest Port 1 View  02/26/2014   CLINICAL DATA:  Ventilation.  EXAM: PORTABLE CHEST - 1 VIEW  COMPARISON:  None.  FINDINGS: Tracheostomy tube, NG tube, right PICC line in stable position. Cardiomegaly with normal pulmonary vascularity. Bilateral patchy pulmonary infiltrates. Small left pleural effusion cannot be excluded. No pneumothorax. No acute osseous abnormality.  IMPRESSION: 1. Lines and tubes in stable position. 2. Persistent bilateral dense pulmonary infiltrates most likely secondary pneumonia. No interim change. Small left pleural effusion. 3. Stable cardiomegaly.  Normal pulmonary vascularity.   Electronically Signed   By: Marcello Moores  Register   On: 02/26/2014 07:26   Dg Chest Port 1 View  02/25/2014   CLINICAL DATA:  Subsequent encounter for ventilator dependence and endotracheal tube placement.  EXAM: PORTABLE CHEST - 1 VIEW  COMPARISON:  Multiple  recent previous exams.  FINDINGS: 0543 hrs. Endotracheal tube tip is 1.3 cm above the base of the carina. The NG tube passes into the stomach although the distal tip position is not included on the film. Right PICC line tip overlies the distal SVC near the RA junction. Patchy bilateral airspace disease is stable. Cardiopericardial silhouette is at upper limits of normal for size. Telemetry leads overlie the chest.  IMPRESSION: Stable exam. No interval change in the patchy bilateral airspace disease suggesting multi focal pneumonia.   Electronically Signed   By: Misty Stanley M.D.   On: 02/25/2014 07:14   Dg Chest Port 1 View  02/24/2014   CLINICAL DATA:  Found on bedroom floor unresponsive; ams  EXAM: PORTABLE CHEST - 1 VIEW  COMPARISON:  None.  FINDINGS: Enlarged cardiac  silhouette. Extensive patchy opacity in both lungs. Mildly prominent interstitial markings. No pleural fluid seen. Unremarkable bones.  IMPRESSION: 1. Extensive bilateral airspace opacity, most likely due to pneumonia or aspiration pneumonitis. This does not have the typical appearance of pulmonary edema. Follow-up to resolution is recommended to exclude any underlying masses. 2. Cardiomegaly and probable chronic interstitial lung disease.   Electronically Signed   By: Enrique Sack M.D.   On: 02/24/2014 11:27   Dg Chest Port 1v Same Day  02/24/2014   CLINICAL DATA:  Right PICC line placement  EXAM: PORTABLE CHEST - 1 VIEW SAME DAY  COMPARISON:  02/24/2014  FINDINGS: Right PICC line is in place. The tip is in the lower right atrium approximately 7-8 cm deep to the cavoatrial junction. Patchy bilateral areas of consolidation are again noted, unchanged. Endotracheal tube remains just above the carina, approximately 12 mm above the carina. NG tube is in the stomach.  IMPRESSION: Right PICC line tip in the lower right atrium approximately 7-8 cm deep to the cavoatrial junction.  Endotracheal tube approximately 12 mm above the carina.  Stable patchy bilateral airspace disease/consolidation.   Electronically Signed   By: Rolm Baptise M.D.   On: 02/24/2014 17:49   Dg Chest Port 1v Same Day  02/24/2014   CLINICAL DATA:  ET tube placement  EXAM: PORTABLE CHEST - 1 VIEW SAME DAY  COMPARISON:  02/24/2014  FINDINGS: Endotracheal tube is 1.6 cm above the carina. NG tube enters the stomach. Bilateral lower lobe airspace opacities as well as left upper lobe opacity again noted, unchanged. Heart is borderline in size.  IMPRESSION: Endotracheal tube 1.6 cm above the carina.  Bilateral airspace opacities are stable.   Electronically Signed   By: Rolm Baptise M.D.   On: 02/24/2014 14:05    Medications:  Prior to Admission:  No prescriptions prior to admission   Scheduled: . antiseptic oral rinse  7 mL Mouth Rinse QID  .  chlorhexidine  15 mL Mouth Rinse BID  . heparin  5,000 Units Subcutaneous 3 times per day  . ipratropium-albuterol  3 mL Nebulization Q4H  . pantoprazole (PROTONIX) IV  40 mg Intravenous QHS  . piperacillin-tazobactam (ZOSYN)  IV  3.375 g Intravenous Q8H  . potassium chloride  10 mEq Intravenous Q1H  . sodium chloride  10-40 mL Intracatheter Q12H  . vancomycin  1,000 mg Intravenous Q24H   Continuous: . sodium chloride 125 mL/hr at 02/26/14 0707  . dextrose 5 % and 0.45% NaCl    . fentaNYL infusion INTRAVENOUS 250 mcg/hr (02/26/14 0707)  . insulin (NOVOLIN-R) infusion 2.6 Units/hr (02/26/14 0720)   CWC:[id number], albuterol, artificial tears, fentaNYL, ondansetron (ZOFRAN) IV, sodium chloride  Assesment: She has acute respiratory failure intubated and on a ventilator. She had diabetic ketoacidosis. She has aspiration pneumonia. She is septic and has acute renal failure as well. She has multi-factorial metabolic acidemia and is not going to be able to be weaned today because of that. Her chest x-ray looks about the same and she is on appropriate treatment for aspiration. Active Problems:   Acute encephalopathy   DKA (diabetic ketoacidoses)   Acute respiratory failure with hypoxia   Acute renal failure   Sepsis with acute organ dysfunction   Aspiration pneumonia   Metabolic acidosis   Sepsis    Plan: Continue current treatments    LOS: 2 days   Jill Ruppe L 02/26/2014, 7:56 AM

## 2014-02-26 NOTE — Progress Notes (Signed)
ANTIBIOTIC CONSULT NOTE  Pharmacy Consult for Vancomycin and Zosyn  Indication: pneumonia and rule out sepsis  No Known Allergies  Patient Measurements: Height: 5\' 7"  (170.2 cm) Weight: 175 lb 14.8 oz (79.8 kg) IBW/kg (Calculated) : 61.6  Vital Signs: Temp: 98.1 F (36.7 C) (01/19 0805) Temp Source: Core (Comment) (01/19 0805) BP: 117/53 mmHg (01/19 1130) Pulse Rate: 81 (01/19 1130) Intake/Output from previous day: 01/18 0701 - 01/19 0700 In: 3928.4 [I.V.:3578.4; IV Piggyback:350] Out: 300 [Urine:300] Intake/Output from this shift: Total I/O In: 902.3 [I.V.:652.3; IV Piggyback:250] Out: -   Labs:  Recent Labs  02/24/14 1815  02/25/14 0442 02/25/14 0743 02/25/14 1136 02/26/14 0545  WBC 23.3*  --  23.7*  --   --  24.2*  HGB 9.6*  --  9.4*  --   --  8.8*  PLT 268  --  229  --   --  175  CREATININE 1.38*  < > 1.73* 1.98* 2.25* 3.38*  < > = values in this interval not displayed. Estimated Creatinine Clearance: 19.7 mL/min (by C-G formula based on Cr of 3.38). No results for input(s): VANCOTROUGH, VANCOPEAK, VANCORANDOM, GENTTROUGH, GENTPEAK, GENTRANDOM, TOBRATROUGH, TOBRAPEAK, TOBRARND, AMIKACINPEAK, AMIKACINTROU, AMIKACIN in the last 72 hours.   Microbiology: Recent Results (from the past 720 hour(s))  MRSA PCR Screening     Status: None   Collection Time: 02/24/14  4:13 PM  Result Value Ref Range Status   MRSA by PCR NEGATIVE NEGATIVE Final    Comment:        The GeneXpert MRSA Assay (FDA approved for NASAL specimens only), is one component of a comprehensive MRSA colonization surveillance program. It is not intended to diagnose MRSA infection nor to guide or monitor treatment for MRSA infections.   Culture, blood (routine x 2) Call MD if unable to obtain prior to antibiotics being given     Status: None (Preliminary result)   Collection Time: 02/24/14  6:09 PM  Result Value Ref Range Status   Specimen Description BLOOD RIGHT HAND  Final   Special  Requests BOTTLES DRAWN AEROBIC AND ANAEROBIC 6CC  Final   Culture NO GROWTH 2 DAYS  Final   Report Status PENDING  Incomplete  Culture, blood (routine x 2) Call MD if unable to obtain prior to antibiotics being given     Status: None (Preliminary result)   Collection Time: 02/24/14  6:25 PM  Result Value Ref Range Status   Specimen Description A-LINE  Final   Special Requests   Final    BOTTLES DRAWN AEROBIC AND ANAEROBIC 6CC DRAWN BY RN   Culture NO GROWTH 2 DAYS  Final   Report Status PENDING  Incomplete  Culture, respiratory (NON-Expectorated)     Status: None (Preliminary result)   Collection Time: 02/24/14 11:35 PM  Result Value Ref Range Status   Specimen Description SPUTUM  Final   Special Requests NONE  Final   Gram Stain   Final    ABUNDANT WBC PRESENT, PREDOMINANTLY PMN NO SQUAMOUS EPITHELIAL CELLS SEEN ABUNDANT GRAM POSITIVE COCCI IN PAIRS Performed at [facility]    Culture   Final    Culture reincubated for better growth Performed at [facility]    Report Status PENDING  Incomplete    Anti-infectives    Start     Dose/Rate Route Frequency Ordered Stop   02/27/14 1700  vancomycin (VANCOCIN) IVPB 1000 mg/200 mL premix     1,000 mg200 mL/hr over 60 Minutes Intravenous Every 48 hours  02/26/14 1254     02/24/14 1800  vancomycin (VANCOCIN) IVPB 1000 mg/200 mL premix  Status:  Discontinued     1,000 mg200 mL/hr over 60 Minutes Intravenous Every 24 hours 02/24/14 1602 02/26/14 1254   02/24/14 1700  piperacillin-tazobactam (ZOSYN) IVPB 3.375 g     3.375 g12.5 mL/hr over 240 Minutes Intravenous Every 8 hours 02/24/14 1602     02/24/14 1215  cefTRIAXone (ROCEPHIN) 1 g in dextrose 5 % 50 mL IVPB     1 g100 mL/hr over 30 Minutes Intravenous  Once 02/24/14 1208 02/24/14 1416   02/24/14 1215  azithromycin (ZITHROMAX) 500 mg in dextrose 5 % 250 mL IVPB     500 mg250 mL/hr over 60 Minutes Intravenous  Once 02/24/14 1208 02/24/14 1318     Assessment: 58 yoF  found unresponsive at home.  On admission she was found to be in DKA, hypothermic, and CXR +PNA.  She was intubated and started on empiric, broad-spectrum antibiotics for potential aspiration PNA/Sepsis.   She is currently afebrile and WBC trending down.  She remains intubated. Respiratory cx +GPC.  Blood cx NGTD. Renal function continues to worsen.  Renal consulted.   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  Estimated CrCl ~61ml/min.    Goal of Therapy:  Vancomycin trough level 15-20 mcg/ml  Plan:  Continue Zosyn 3.375gm IV every 8 hours -will need to decrease dose if renal function continues to worsen Follow-up micro data, labs, vitals  Decrease Vancomycin 1gm IV every 48 hours Check Vancomycin trough at steady state Duration of therapy per MD  Elson Clan 02/26/2014,12:54 PM

## 2014-02-26 NOTE — Progress Notes (Signed)
CRITICAL VALUE ALERT  Critical value received:ABG  Date of notification:  02/26/14   Time of notification:  0505  Critical value read back:Yes.    Nurse who received alert:  Carloyn MannerJustin R. Daneil Beem, RN  MD notified (1st page):  [place]   Time of first page:  0506  MD notified (2nd page):  Time of second page:  Responding MD:  Sharl MaLama  Time MD responded:  [phone]

## 2014-02-26 NOTE — Progress Notes (Signed)
Jackie Fisher  MRN: [id number]  DOB/AGE: Jun 22, 1955 59 y.o.  Primary Care Physician:No primary care provider on file.  Admit date: 02/24/2014  Chief Complaint:  Chief Complaint  Patient presents with  . unresponsive     S-Pt presented on  02/24/2014 with  Chief Complaint  Patient presents with  . unresponsive   .    Pt intubated, unable to offer any complaints .  Meds . antiseptic oral rinse  7 mL Mouth Rinse QID  . chlorhexidine  15 mL Mouth Rinse BID  . clotrimazole  1 Applicatorful Vaginal QHS  . heparin  5,000 Units Subcutaneous 3 times per day  . ipratropium-albuterol  3 mL Nebulization Q4H  . pantoprazole (PROTONIX) IV  40 mg Intravenous QHS  . piperacillin-tazobactam (ZOSYN)  IV  3.375 g Intravenous Q8H  . sodium chloride  10-40 mL Intracatheter Q12H  . vancomycin  1,000 mg Intravenous Q24H        Physical Exam: Vital signs in last 24 hours: Temp:  [97.9 F (36.6 C)-99.4 F (37.4 C)] 98.1 F (36.7 C) (01/19 0805) Pulse Rate:  [77-90] 84 (01/19 0915) Resp:  [12-23] 15 (01/19 0915) BP: (95-132)/(46-69) 116/50 mmHg (01/19 0915) SpO2:  [99 %-100 %] 100 % (01/19 0915) FiO2 (%):  [40 %] 40 % (01/19 0747) Weight:  [175 lb 14.8 oz (79.8 kg)] 175 lb 14.8 oz (79.8 kg) (01/19 0500) Weight change: 40 lb 14.8 oz (18.564 kg) Last BM Date:  (UNKNOWN)  Intake/Output from previous day: 01/18 0701 - 01/19 0700 In: 3928.4 [I.V.:3578.4; IV Piggyback:350] Out: 300 [Urine:300] Total I/O In: 650 [I.V.:450; IV Piggyback:200] Out: -    Physical Exam: General- intubated Resp- ET tube insitu,Rhonchi + CVS- S1S2 regular in rate and rhythm GIT- BS+, soft, NT, ND EXT- Trace LE Edema, Cyanosis   Lab Results: CBC  Recent Labs  02/25/14 0442 02/26/14 0545  WBC 23.7* 24.2*  HGB 9.4* 8.8*  HCT 27.6* 25.5*  PLT 229 175    BMET  Recent Labs  02/25/14 1136 02/26/14 0545  NA 138 136  K 3.8 3.5  CL 113* 111  CO2 19 16*  GLUCOSE 138* 349*  BUN 43* 55*   CREATININE 2.25* 3.38*  CALCIUM 8.8 8.6   Trend Creat 2016 1.83=>2.25=>3.38   Anion gap 136-127=11  Delta AG 11-5=6  Delta bicarb 24-16=8  ABG shows PH 7.17 PCO2  41  Expected PCo2 16 times 1.5 + 8=32 +-2 30-34  MICRO Recent Results (from the past 240 hour(s))  MRSA PCR Screening     Status: None   Collection Time: 02/24/14  4:13 PM  Result Value Ref Range Status   MRSA by PCR NEGATIVE NEGATIVE Final    Comment:        The GeneXpert MRSA Assay (FDA approved for NASAL specimens only), is one component of a comprehensive MRSA colonization surveillance program. It is not intended to diagnose MRSA infection nor to guide or monitor treatment for MRSA infections.   Culture, blood (routine x 2) Call MD if unable to obtain prior to antibiotics being given     Status: None (Preliminary result)   Collection Time: 02/24/14  6:09 PM  Result Value Ref Range Status   Specimen Description BLOOD RIGHT HAND  Final   Special Requests BOTTLES DRAWN AEROBIC AND ANAEROBIC 6CC  Final   Culture NO GROWTH 1 DAY  Final   Report Status PENDING  Incomplete  Culture, blood (routine x 2) Call MD if unable to obtain prior to  antibiotics being given     Status: None (Preliminary result)   Collection Time: 02/24/14  6:25 PM  Result Value Ref Range Status   Specimen Description A-LINE  Final   Special Requests   Final    BOTTLES DRAWN AEROBIC AND ANAEROBIC 6CC DRAWN BY RN   Culture NO GROWTH 1 DAY  Final   Report Status PENDING  Incomplete  Culture, respiratory (NON-Expectorated)     Status: None (Preliminary result)   Collection Time: 02/24/14 11:35 PM  Result Value Ref Range Status   Specimen Description SPUTUM  Final   Special Requests NONE  Final   Gram Stain PENDING  Incomplete   Culture   Final    Culture reincubated for better growth Performed at [facility]    Report Status PENDING  Incomplete      Lab Results  Component Value Date   CALCIUM 8.6 02/26/2014         Renal U/s Right Kidney: 12.0 cm. Left Kidney: 13.2 cm.   CXR 1. Lines and tubes in stable position. 2. Persistent bilateral dense pulmonary infiltrates most likely secondary pneumonia. No interim change. Small left pleural effusion. 3. Stable cardiomegaly. Normal pulmonary vascularity.  Impression: 1)Renal  AKI secondary to Prerenal/ATN               AKI on CKD ?                   Not much data available for CKD                    Only data is u/a showing proteinuria and creat high at presentation                            AKI worsening-oliguric now                CKD stage 3?                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  5)DM- admitted with DKA Primary MD following  6)Electrolytes Normokalemic NOrmonatremic   7)Acid base High AG acidosis NON AG acidosis Resp acidosis   8) ID- Admitted with Pneumonia/Sepsis On IV ABX      Plan:  Will suggest to add Bicarb in IVF ( d5 + 75meq bicarb at 7775ml/hr ) Vent as per  Pulmonary Will suggest to add another IVF for DKA issues Will add lasix 80 Iv once to se if we can improve urine output.      BHUTANI,MANPREET S 02/26/2014, 9:42 AM

## 2014-02-27 ENCOUNTER — Inpatient Hospital Stay (HOSPITAL_COMMUNITY): Payer: Self-pay

## 2014-02-27 DIAGNOSIS — E0810 Diabetes mellitus due to underlying condition with ketoacidosis without coma: Secondary | ICD-10-CM

## 2014-02-27 DIAGNOSIS — E872 Acidosis: Secondary | ICD-10-CM

## 2014-02-27 DIAGNOSIS — D696 Thrombocytopenia, unspecified: Secondary | ICD-10-CM

## 2014-02-27 DIAGNOSIS — R400 Somnolence: Secondary | ICD-10-CM

## 2014-02-27 DIAGNOSIS — J189 Pneumonia, unspecified organism: Secondary | ICD-10-CM

## 2014-02-27 DIAGNOSIS — N170 Acute kidney failure with tubular necrosis: Secondary | ICD-10-CM

## 2014-02-27 LAB — CBC
HCT: 23.7 % — ABNORMAL LOW (ref 36.0–46.0)
HCT: 25.9 % — ABNORMAL LOW (ref 36.0–46.0)
Hemoglobin: 8.3 g/dL — ABNORMAL LOW (ref 12.0–15.0)
Hemoglobin: 9 g/dL — ABNORMAL LOW (ref 12.0–15.0)
MCH: 31.9 pg (ref 26.0–34.0)
MCH: 31 pg (ref 26.0–34.0)
MCHC: 35 g/dL (ref 30.0–36.0)
MCHC: 34.7 g/dL (ref 30.0–36.0)
MCV: 91.2 fL (ref 78.0–100.0)
MCV: 89.3 fL (ref 78.0–100.0)
PLATELETS: 142 10*3/uL — AB (ref 150–400)
Platelets: 137 10*3/uL — ABNORMAL LOW (ref 150–400)
RBC: 2.6 MIL/uL — ABNORMAL LOW (ref 3.87–5.11)
RBC: 2.9 MIL/uL — ABNORMAL LOW (ref 3.87–5.11)
RDW: 15 % (ref 11.5–15.5)
RDW: 14.9 % (ref 11.5–15.5)
WBC: 17.9 10*3/uL — ABNORMAL HIGH (ref 4.0–10.5)
WBC: 17 10*3/uL — ABNORMAL HIGH (ref 4.0–10.5)

## 2014-02-27 LAB — GLUCOSE, CAPILLARY
GLUCOSE-CAPILLARY: 161 mg/dL — AB (ref 70–99)
GLUCOSE-CAPILLARY: 162 mg/dL — AB (ref 70–99)
GLUCOSE-CAPILLARY: 138 mg/dL — AB (ref 70–99)
GLUCOSE-CAPILLARY: 125 mg/dL — AB (ref 70–99)
GLUCOSE-CAPILLARY: 158 mg/dL — AB (ref 70–99)
GLUCOSE-CAPILLARY: 200 mg/dL — AB (ref 70–99)
GLUCOSE-CAPILLARY: 216 mg/dL — AB (ref 70–99)
GLUCOSE-CAPILLARY: 205 mg/dL — AB (ref 70–99)
GLUCOSE-CAPILLARY: 151 mg/dL — AB (ref 70–99)
GLUCOSE-CAPILLARY: 130 mg/dL — AB (ref 70–99)
GLUCOSE-CAPILLARY: 131 mg/dL — AB (ref 70–99)
Glucose-Capillary: 124 mg/dL — ABNORMAL HIGH (ref 70–99)
Glucose-Capillary: 132 mg/dL — ABNORMAL HIGH (ref 70–99)
Glucose-Capillary: 147 mg/dL — ABNORMAL HIGH (ref 70–99)
Glucose-Capillary: 148 mg/dL — ABNORMAL HIGH (ref 70–99)
Glucose-Capillary: 156 mg/dL — ABNORMAL HIGH (ref 70–99)
Glucose-Capillary: 188 mg/dL — ABNORMAL HIGH (ref 70–99)
Glucose-Capillary: 189 mg/dL — ABNORMAL HIGH (ref 70–99)
Glucose-Capillary: 86 mg/dL (ref 70–99)

## 2014-02-27 LAB — BASIC METABOLIC PANEL
ANION GAP: 10 (ref 5–15)
Anion gap: 11 (ref 5–15)
BUN: 64 mg/dL — ABNORMAL HIGH (ref 6–23)
BUN: 68 mg/dL — ABNORMAL HIGH (ref 6–23)
CALCIUM: 8.1 mg/dL — AB (ref 8.4–10.5)
CHLORIDE: 109 meq/L (ref 96–112)
CO2: 18 mmol/L — AB (ref 19–32)
CO2: 20 mmol/L (ref 19–32)
CREATININE: 4.35 mg/dL — AB (ref 0.50–1.10)
Calcium: 8.4 mg/dL (ref 8.4–10.5)
Chloride: 104 mEq/L (ref 96–112)
Creatinine, Ser: 4.83 mg/dL — ABNORMAL HIGH (ref 0.50–1.10)
GFR calc Af Amer: 12 mL/min — ABNORMAL LOW (ref >90)
GFR calc Af Amer: 11 mL/min — ABNORMAL LOW (ref >90)
GFR calc non Af Amer: 10 mL/min — ABNORMAL LOW (ref >90)
GFR calc non Af Amer: 9 mL/min — ABNORMAL LOW (ref >90)
GLUCOSE: 99 mg/dL (ref 70–99)
Glucose, Bld: 168 mg/dL — ABNORMAL HIGH (ref 70–99)
POTASSIUM: 3.4 mmol/L — AB (ref 3.5–5.1)
POTASSIUM: 3.7 mmol/L (ref 3.5–5.1)
SODIUM: 137 mmol/L (ref 135–145)
Sodium: 135 mmol/L (ref 135–145)

## 2014-02-27 LAB — C3 COMPLEMENT: C3 Complement: 93 mg/dL (ref 90–180)

## 2014-02-27 LAB — BLOOD GAS, ARTERIAL
ACID-BASE DEFICIT: 9.2 mmol/L — AB (ref 0.0–2.0)
Acid-base deficit: 8.9 mmol/L — ABNORMAL HIGH (ref 0.0–2.0)
BICARBONATE: 17.2 meq/L — AB (ref 20.0–24.0)
Bicarbonate: 15.3 mEq/L — ABNORMAL LOW (ref 20.0–24.0)
Drawn by: 22223
Drawn by: 347621
FIO2: 40 %
FIO2: 0.4 %
LHR: 20 {breaths}/min
MECHVT: 450 mL
MECHVT: 490 mL
O2 Saturation: 98.9 %
O2 Saturation: 98.1 %
PATIENT TEMPERATURE: 37
PATIENT TEMPERATURE: 97.3
PCO2 ART: 41.5 mmHg (ref 35.0–45.0)
PEEP: 5 cmH2O
PEEP: 5 cmH2O
PH ART: 7.24 — AB (ref 7.350–7.450)
PO2 ART: 159 mmHg — AB (ref 80.0–100.0)
RATE: 15 resp/min
TCO2: 16.5 mmol/L (ref 0–100)
TCO2: 16.2 mmol/L (ref 0–100)
pCO2 arterial: 27.8 mmHg — ABNORMAL LOW (ref 35.0–45.0)
pH, Arterial: 7.356 (ref 7.350–7.450)
pO2, Arterial: 167 mmHg — ABNORMAL HIGH (ref 80.0–100.0)

## 2014-02-27 LAB — COMPLEMENT, TOTAL: Compl, Total (CH50): 40 U/mL (ref 31–60)

## 2014-02-27 LAB — RETICULOCYTES
RBC.: 2.9 MIL/uL — AB (ref 3.87–5.11)
Retic Count, Absolute: 17.4 10*3/uL — ABNORMAL LOW (ref 19.0–186.0)
Retic Ct Pct: 0.6 % (ref 0.4–3.1)

## 2014-02-27 LAB — LACTATE DEHYDROGENASE: LDH: 252 U/L — AB (ref 94–250)

## 2014-02-27 LAB — SAVE SMEAR

## 2014-02-27 LAB — ANTISTREPTOLYSIN O TITER: ASO: <25 IU/mL (ref <409)

## 2014-02-27 LAB — C4 COMPLEMENT: Complement C4, Body Fluid: 13 mg/dL — ABNORMAL LOW (ref 10–40)

## 2014-02-27 LAB — MRSA PCR SCREENING: MRSA by PCR: NEGATIVE

## 2014-02-27 LAB — PROCALCITONIN: PROCALCITONIN: 24.81 ng/mL

## 2014-02-27 LAB — LACTIC ACID, PLASMA: LACTIC ACID, VENOUS: 0.8 mmol/L (ref 0.5–2.2)

## 2014-02-27 MED ORDER — SODIUM BICARBONATE 8.4 % IV SOLN
INTRAVENOUS | Status: DC
  Filled 2014-02-27: qty 150

## 2014-02-27 MED ORDER — PIPERACILLIN-TAZOBACTAM IN DEX 2-0.25 GM/50ML IV SOLN
2.2500 g | Freq: Three times a day (TID) | INTRAVENOUS | Status: DC
  Administered 2014-02-27 – 2014-02-28 (×3): 2.25 g via INTRAVENOUS
  Filled 2014-02-27 (×5): qty 50

## 2014-02-27 MED ORDER — SODIUM CHLORIDE 0.45 % IV SOLN
INTRAVENOUS | Status: DC
  Administered 2014-02-27: 22:00:00 via INTRAVENOUS
  Administered 2014-03-01: 10 mL/h via INTRAVENOUS

## 2014-02-27 MED ORDER — FLUCONAZOLE 100MG IVPB
100.0000 mg | INTRAVENOUS | Status: DC
  Administered 2014-02-27: 100 mg via INTRAVENOUS
  Filled 2014-02-27 (×2): qty 50

## 2014-02-27 MED ORDER — SODIUM BICARBONATE 8.4 % IV SOLN
INTRAVENOUS | Status: DC
  Filled 2014-02-27: qty 1000

## 2014-02-27 MED ORDER — PRO-STAT SUGAR FREE PO LIQD
30.0000 mL | Freq: Two times a day (BID) | ORAL | Status: DC
  Administered 2014-02-27 – 2014-02-28 (×2): 30 mL
  Filled 2014-02-27 (×3): qty 30

## 2014-02-27 MED ORDER — IPRATROPIUM-ALBUTEROL 0.5-2.5 (3) MG/3ML IN SOLN
3.0000 mL | Freq: Four times a day (QID) | RESPIRATORY_TRACT | Status: DC
  Administered 2014-02-27 – 2014-03-02 (×12): 3 mL via RESPIRATORY_TRACT
  Filled 2014-02-27 (×11): qty 3

## 2014-02-27 MED ORDER — VITAL HIGH PROTEIN PO LIQD
1000.0000 mL | ORAL | Status: DC
  Administered 2014-02-27: 1000 mL
  Filled 2014-02-27 (×2): qty 1000

## 2014-02-27 MED ORDER — FAMOTIDINE 40 MG/5ML PO SUSR
20.0000 mg | Freq: Every day | ORAL | Status: DC
  Administered 2014-02-27 – 2014-03-02 (×4): 20 mg
  Filled 2014-02-27 (×4): qty 2.5

## 2014-02-27 MED ORDER — ACETAMINOPHEN 160 MG/5ML PO SOLN: 650.0000 mg | ORAL | Status: DC | PRN

## 2014-02-27 MED ORDER — INSULIN ASPART 100 UNIT/ML ~~LOC~~ SOLN
0.0000 [IU] | SUBCUTANEOUS | Status: DC
  Administered 2014-02-27: 2 [IU] via SUBCUTANEOUS
  Administered 2014-02-27: 3 [IU] via SUBCUTANEOUS
  Administered 2014-02-28: 5 [IU] via SUBCUTANEOUS
  Administered 2014-02-28: 2 [IU] via SUBCUTANEOUS
  Administered 2014-02-28: 3 [IU] via SUBCUTANEOUS
  Administered 2014-02-28: 2 [IU] via SUBCUTANEOUS
  Administered 2014-02-28 – 2014-03-01 (×2): 5 [IU] via SUBCUTANEOUS
  Administered 2014-03-01: 8 [IU] via SUBCUTANEOUS
  Administered 2014-03-01 (×3): 3 [IU] via SUBCUTANEOUS

## 2014-02-27 MED ORDER — INSULIN GLARGINE 100 UNIT/ML ~~LOC~~ SOLN
5.0000 [IU] | Freq: Every day | SUBCUTANEOUS | Status: DC
  Administered 2014-02-27 – 2014-02-28 (×2): 5 [IU] via SUBCUTANEOUS
  Filled 2014-02-27 (×3): qty 0.05

## 2014-02-27 MED ORDER — POTASSIUM CHLORIDE 10 MEQ/100ML IV SOLN
10.0000 meq | INTRAVENOUS | Status: AC
  Administered 2014-02-27 (×4): 10 meq via INTRAVENOUS
  Filled 2014-02-27 (×3): qty 100

## 2014-02-27 NOTE — Progress Notes (Signed)
eLink Physician-Brief Progress Note Patient Name: Rhett BannisterLula D Dern DOB: Sep 09, 1955 MRN: [id number]   Date of Service  02/27/2014  HPI/Events of Note  Complicated [facility] due to PNA and probable ALI. Also has AKI (probably on CKD) with nongap acidosis. Initially did have an anion gap acidosis which was treated as DKA. Remained on insulin gtt on transfer and had HCO3 gtt running. I have reviewed care plan with resident MD and pt is to be seen by Dr Tyson AliasFeinstein   eICU Interventions  1) vent settings reviewed and adjusted 2) transition from insulin gtt to Lantus + SSI 3) DC HCO3 gtt and follow chem panel/ABGs 4) cont vanc/Zosyn for now 5) Sedation orders reviewed - RASS goal -1     Intervention Category Evaluation Type: New Patient Evaluation  Billy FischerDavid Mariam Helbert 02/27/2014, 7:16 PM

## 2014-02-27 NOTE — Progress Notes (Signed)
Inpatient Diabetes Program Recommendations  AACE/ADA: New Consensus Statement on Inpatient Glycemic Control (2013)  Target Ranges:  Prepandial:   less than 140 mg/dL      Peak postprandial:   less than 180 mg/dL (1-2 hours)      Critically ill patients:  140 - 180 mg/dL   Results for Jackie Fisher, Jackie Fisher (MRN [id number]) as of 02/27/2014 07:14  Ref. Range 02/27/2014 00:35 02/27/2014 01:40 02/27/2014 02:36 02/27/2014 03:49 02/27/2014 04:49 02/27/2014 05:43 02/27/2014 06:36  Glucose-Capillary Latest Range: 70-99 mg/dL 782189 (H) 956161 (H) 213162 (H) 147 (H) 148 (H) 132 (H) 138 (H)   Current orders for Inpatient glycemic control: On IV insulin drip per GlucoStabilizer protocol  Inpatient Diabetes Program Recommendations Insulin - IV drip/GlucoStabilizer: Blood glucose has been in target range since midnight. Pt has required 24.1 units of insulin over the past 6 hours to maintain target glucose. Calculating for 24 hr period the patient would require 96.4 units of insulin. At time of transition off of IV insulin drip, recommend ordering at least Lantus 48-50 units Q24H.   Thanks,  Christena DeemShannon Solae Norling RN, MSN, [facility] Inpatient Diabetes Coordinator Team Pager [phone]

## 2014-02-27 NOTE — Progress Notes (Signed)
Subjective: She is overall about the same. She is intubated and on the ventilator. Her blood sugar has been better.  Objective: Vital signs in last 24 hours: Temp:  [97.9 F (36.6 C)-98.3 F (36.8 C)] 98 F (36.7 C) (01/20 0400) Pulse Rate:  [77-94] 78 (01/20 0745) Resp:  [11-28] 16 (01/20 0745) BP: (97-134)/(44-88) 116/56 mmHg (01/20 0745) SpO2:  [99 %-100 %] 100 % (01/20 0745) FiO2 (%):  [40 %] 40 % (01/20 0318) Weight:  [81.2 kg (179 lb 0.2 oz)] 81.2 kg (179 lb 0.2 oz) (01/20 0500) Weight change: 1.4 kg (3 lb 1.4 oz) Last BM Date:  (UNKNOWN)  Intake/Output from previous day: 01/19 0701 - 01/20 0700 In: 3713.1 [I.V.:3063.1; IV Piggyback:650] Out: 500 [Urine:500]  PHYSICAL EXAM General appearance: She is intubated and sedated but arousable. Resp: rhonchi bilaterally Cardio: regular rate and rhythm, S1, S2 normal, no murmur, click, rub or gallop GI: soft, non-tender; bowel sounds normal; no masses,  no organomegaly Extremities: extremities normal, atraumatic, no cyanosis or edema  Lab Results:  Results for orders placed or performed during the hospital encounter of 02/24/14 (from the past 48 hour(s))  Glucose, capillary     Status: Abnormal   Collection Time: 02/25/14  8:56 AM  Result Value Ref Range   Glucose-Capillary 140 (H) 70 - 99 mg/dL   Comment 1 Notify RN   Glucose, capillary     Status: Abnormal   Collection Time: 02/25/14  9:56 AM  Result Value Ref Range   Glucose-Capillary 146 (H) 70 - 99 mg/dL   Comment 1 Notify RN   Glucose, capillary     Status: Abnormal   Collection Time: 02/25/14 10:57 AM  Result Value Ref Range   Glucose-Capillary 114 (H) 70 - 99 mg/dL  Basic metabolic panel (stat then every 4 hours)     Status: Abnormal   Collection Time: 02/25/14 11:36 AM  Result Value Ref Range   Sodium 138 135 - 145 mmol/L    Comment: Please note change in reference range.   Potassium 3.8 3.5 - 5.1 mmol/L    Comment: Please note change in reference range.    Chloride 113 (H) 96 - 112 mEq/L   CO2 19 19 - 32 mmol/L   Glucose, Bld 138 (H) 70 - 99 mg/dL   BUN 43 (H) 6 - 23 mg/dL   Creatinine, Ser 2.25 (H) 0.50 - 1.10 mg/dL   Calcium 8.8 8.4 - 10.5 mg/dL   GFR calc non Af Amer 23 (L) >90 mL/min   GFR calc Af Amer 26 (L) >90 mL/min    Comment: (NOTE) The eGFR has been calculated using the CKD EPI equation. This calculation has not been validated in all clinical situations. eGFR's persistently <90 mL/min signify possible Chronic Kidney Disease.    Anion gap 6 5 - 15  Glucose, capillary     Status: None   Collection Time: 02/25/14 12:00 PM  Result Value Ref Range   Glucose-Capillary 93 70 - 99 mg/dL  Glucose, capillary     Status: Abnormal   Collection Time: 02/25/14 12:56 PM  Result Value Ref Range   Glucose-Capillary 211 (H) 70 - 99 mg/dL   Comment 1 Notify RN   Glucose, capillary     Status: Abnormal   Collection Time: 02/25/14  2:06 PM  Result Value Ref Range   Glucose-Capillary 172 (H) 70 - 99 mg/dL   Comment 1 Notify RN   Glucose, capillary     Status: Abnormal   Collection  Time: 02/25/14  2:55 PM  Result Value Ref Range   Glucose-Capillary 173 (H) 70 - 99 mg/dL  Glucose, capillary     Status: Abnormal   Collection Time: 02/25/14  4:01 PM  Result Value Ref Range   Glucose-Capillary 145 (H) 70 - 99 mg/dL  Glucose, capillary     Status: Abnormal   Collection Time: 02/25/14  5:00 PM  Result Value Ref Range   Glucose-Capillary 152 (H) 70 - 99 mg/dL   Comment 1 Notify RN   Antistreptolysin O titer     Status: None   Collection Time: 02/25/14  5:27 PM  Result Value Ref Range   ASO <25 <409 IU/mL    Comment: Performed at [facility]  Hepatitis B surface antigen     Status: None   Collection Time: 02/25/14  5:27 PM  Result Value Ref Range   Hepatitis B Surface Ag NEGATIVE NEGATIVE    Comment: Performed at [facility]  Hepatitis C antibody     Status: None   Collection Time: 02/25/14  5:27 PM  Result Value Ref  Range   HCV Ab NEGATIVE NEGATIVE    Comment: Performed at [facility]  C4 complement     Status: Abnormal   Collection Time: 02/25/14  5:27 PM  Result Value Ref Range   Complement C4, Body Fluid 13 (L) 10 - 40 mg/dL    Comment: Performed at [facility]  C3 complement     Status: None   Collection Time: 02/25/14  5:27 PM  Result Value Ref Range   C3 Complement 93 90 - 180 mg/dL    Comment: Performed at [facility]  ANA     Status: None   Collection Time: 02/25/14  5:27 PM  Result Value Ref Range   ANA Ser Ql NEGATIVE NEGATIVE    Comment: Performed at [facility]  Mpo/pr-3 (anca) antibodies     Status: None   Collection Time: 02/25/14  5:27 PM  Result Value Ref Range   Myeloperoxidase Abs <1.0 <1.0 AI    Comment: (NOTE)                              Value   Interpretation                             <1.0 AI: No Antibody Detected                          >or=1.0 AI: Antibody Detected Autoantibodies to myeloperoxidase (MPO) are commonly associated with the following small-vessel vasculitides: microscopic polyangiitis, polyarteritis nodosa, Churg-Strauss syndrome, necrotizing and crescentic glomerulonephritis and occasionally Wegener's granulomatosis. The perinuclear IFA pattern, (p-ANCA), is based largely on autoantibody to myeloperoxidase which serves as the primary antigen These autoantibodies are present in active disease state.    Serine Protease 3 <1.0 <1.0 AI    Comment: (NOTE)                              Value   Interpretation                             <1.0 AI: No Antibody Detected                          >  or=1.0 AI: Antibody Detected Autoantibodies to proteinase-3 (PR-3) are accepted as characteristic for granulomatosis with polyangiitis (Wegener's), and are detectable in 95% of the histologically proven cases. The cytoplasmic IFA pattern, (c-ANCA), is based largely on autoantibody to PR-3 which serves as the primary  antigen. These autoantibodies are present in active disease state. Performed at [facility]   Glucose, capillary     Status: Abnormal   Collection Time: 02/25/14  6:01 PM  Result Value Ref Range   Glucose-Capillary 158 (H) 70 - 99 mg/dL   Comment 1 Notify RN   Glucose, capillary     Status: Abnormal   Collection Time: 02/25/14  6:53 PM  Result Value Ref Range   Glucose-Capillary 165 (H) 70 - 99 mg/dL   Comment 1 Notify RN   Glucose, capillary     Status: Abnormal   Collection Time: 02/25/14  8:04 PM  Result Value Ref Range   Glucose-Capillary 162 (H) 70 - 99 mg/dL   Comment 1 Notify RN   Glucose, capillary     Status: Abnormal   Collection Time: 02/25/14  8:51 PM  Result Value Ref Range   Glucose-Capillary 167 (H) 70 - 99 mg/dL   Comment 1 Notify RN   Glucose, capillary     Status: Abnormal   Collection Time: 02/25/14 10:03 PM  Result Value Ref Range   Glucose-Capillary 137 (H) 70 - 99 mg/dL   Comment 1 Notify RN   Glucose, capillary     Status: Abnormal   Collection Time: 02/25/14 10:44 PM  Result Value Ref Range   Glucose-Capillary 134 (H) 70 - 99 mg/dL   Comment 1 Notify RN   Glucose, capillary     Status: Abnormal   Collection Time: 02/25/14 11:42 PM  Result Value Ref Range   Glucose-Capillary 145 (H) 70 - 99 mg/dL   Comment 1 Notify RN   Glucose, capillary     Status: Abnormal   Collection Time: 02/26/14  3:59 AM  Result Value Ref Range   Glucose-Capillary 295 (H) 70 - 99 mg/dL   Comment 1 Notify RN   Blood gas, arterial     Status: Abnormal   Collection Time: 02/26/14  4:45 AM  Result Value Ref Range   FIO2 40.00 %   Delivery systems VENTILATOR    Mode PRESSURE REGULATED VOLUME CONTROL    VT 450 mL   Rate 15 resp/min   Peep/cpap 5.0 cm H20   pH, Arterial 7.174 (LL) 7.350 - 7.450    Comment: CRITICAL RESULT CALLED TO, READ BACK BY AND VERIFIED WITH: MIZE,J.RN AT 0504 02/26/14 BY BROADNAX,L.RRT    pCO2 arterial 41.4 35.0 - 45.0 mmHg   pO2,  Arterial 159.0 (H) 80.0 - 100.0 mmHg   Bicarbonate 14.6 (L) 20.0 - 24.0 mEq/L   TCO2 13.6 0 - 100 mmol/L   Acid-base deficit 12.2 (H) 0.0 - 2.0 mmol/L   O2 Saturation 98.6 %   Patient temperature 37.0    Collection site LEFT RADIAL    Drawn by [id number]    Sample type ARTERIAL    Allens test (pass/fail) PASS PASS  CBC     Status: Abnormal   Collection Time: 02/26/14  5:45 AM  Result Value Ref Range   WBC 24.2 (H) 4.0 - 10.5 K/uL   RBC 2.78 (L) 3.87 - 5.11 MIL/uL   Hemoglobin 8.8 (L) 12.0 - 15.0 g/dL   HCT 25.5 (L) 36.0 - 46.0 %   MCV 91.7 78.0 - 100.0 fL  MCH 31.7 26.0 - 34.0 pg   MCHC 34.5 30.0 - 36.0 g/dL   RDW 14.9 11.5 - 15.5 %   Platelets 175 150 - 400 K/uL    Comment: DELTA CHECK NOTED  Comprehensive metabolic panel     Status: Abnormal   Collection Time: 02/26/14  5:45 AM  Result Value Ref Range   Sodium 136 135 - 145 mmol/L    Comment: Please note change in reference range.   Potassium 3.5 3.5 - 5.1 mmol/L    Comment: Please note change in reference range.   Chloride 111 96 - 112 mEq/L   CO2 16 (L) 19 - 32 mmol/L   Glucose, Bld 349 (H) 70 - 99 mg/dL   BUN 55 (H) 6 - 23 mg/dL   Creatinine, Ser 3.38 (H) 0.50 - 1.10 mg/dL    Comment: DELTA CHECK NOTED   Calcium 8.6 8.4 - 10.5 mg/dL   Total Protein 5.6 (L) 6.0 - 8.3 g/dL   Albumin 1.7 (L) 3.5 - 5.2 g/dL   AST 21 0 - 37 U/L   ALT 19 0 - 35 U/L   Alkaline Phosphatase 151 (H) 39 - 117 U/L   Total Bilirubin 0.6 0.3 - 1.2 mg/dL   GFR calc non Af Amer 14 (L) >90 mL/min   GFR calc Af Amer 16 (L) >90 mL/min    Comment: (NOTE) The eGFR has been calculated using the CKD EPI equation. This calculation has not been validated in all clinical situations. eGFR's persistently <90 mL/min signify possible Chronic Kidney Disease.    Anion gap 9 5 - 15  Glucose, capillary     Status: Abnormal   Collection Time: 02/26/14  6:49 AM  Result Value Ref Range   Glucose-Capillary 315 (H) 70 - 99 mg/dL   Comment 1 Notify RN   Glucose,  capillary     Status: Abnormal   Collection Time: 02/26/14  8:14 AM  Result Value Ref Range   Glucose-Capillary 291 (H) 70 - 99 mg/dL   Comment 1 Notify RN   Glucose, capillary     Status: Abnormal   Collection Time: 02/26/14  9:13 AM  Result Value Ref Range   Glucose-Capillary 245 (H) 70 - 99 mg/dL   Comment 1 Notify RN   Glucose, capillary     Status: Abnormal   Collection Time: 02/26/14 10:17 AM  Result Value Ref Range   Glucose-Capillary 245 (H) 70 - 99 mg/dL   Comment 1 Notify RN   Glucose, capillary     Status: Abnormal   Collection Time: 02/26/14 11:15 AM  Result Value Ref Range   Glucose-Capillary 202 (H) 70 - 99 mg/dL   Comment 1 Notify RN   Glucose, capillary     Status: Abnormal   Collection Time: 02/26/14 12:14 PM  Result Value Ref Range   Glucose-Capillary 189 (H) 70 - 99 mg/dL   Comment 1 Notify RN   Glucose, capillary     Status: Abnormal   Collection Time: 02/26/14  1:20 PM  Result Value Ref Range   Glucose-Capillary 172 (H) 70 - 99 mg/dL   Comment 1 Notify RN   Glucose, capillary     Status: Abnormal   Collection Time: 02/26/14  2:29 PM  Result Value Ref Range   Glucose-Capillary 137 (H) 70 - 99 mg/dL   Comment 1 Notify RN   Glucose, capillary     Status: Abnormal   Collection Time: 02/26/14  3:20 PM  Result Value Ref Range  Glucose-Capillary 131 (H) 70 - 99 mg/dL   Comment 1 Notify RN   Glucose, capillary     Status: Abnormal   Collection Time: 02/26/14  4:19 PM  Result Value Ref Range   Glucose-Capillary 152 (H) 70 - 99 mg/dL   Comment 1 Notify RN   Glucose, capillary     Status: Abnormal   Collection Time: 02/26/14  5:22 PM  Result Value Ref Range   Glucose-Capillary 148 (H) 70 - 99 mg/dL   Comment 1 Notify RN   Protein, urine, 24 hour     Status: Abnormal   Collection Time: 02/26/14  5:31 PM  Result Value Ref Range   Urine Total Volume-UPROT 425 mL   Collection Interval-UPROT 24 hours   Protein, Urine 230 mg/dL    Comment: RESULTS  CONFIRMED BY MANUAL DILUTION   Protein, 24H Urine 978 (H) 50 - 100 mg/day  Blood gas, arterial     Status: Abnormal   Collection Time: 02/26/14  5:35 PM  Result Value Ref Range   FIO2 0.40 %   O2 Content 40.0 L/min   Delivery systems VENTILATOR    Mode PRESSURE REGULATED VOLUME CONTROL    VT 450 mL   Rate 15 resp/min   Peep/cpap 5.0 cm H20   pH, Arterial 7.194 (LL) 7.350 - 7.450    Comment: CRITICAL RESULT CALLED TO, READ BACK BY AND VERIFIED WITH: ERICA SPANGLER,RN ON 02/26/2014 BY PEVIANY LAWSON,RRT AT 4650.    pCO2 arterial 42.0 35.0 - 45.0 mmHg   pO2, Arterial 148.0 (H) 80.0 - 100.0 mmHg   Bicarbonate 15.6 (L) 20.0 - 24.0 mEq/L   TCO2 15.4 0 - 100 mmol/L   Acid-base deficit 11.0 (H) 0.0 - 2.0 mmol/L   O2 Saturation 98.7 %   Patient temperature 37.0    Collection site LEFT RADIAL    Drawn by [phone]    Sample type ARTERIAL    Allens test (pass/fail) PASS PASS  Basic metabolic panel     Status: Abnormal   Collection Time: 02/26/14  5:49 PM  Result Value Ref Range   Sodium 138 135 - 145 mmol/L    Comment: Please note change in reference range.   Potassium 3.3 (L) 3.5 - 5.1 mmol/L    Comment: Please note change in reference range.   Chloride 112 96 - 112 mEq/L   CO2 17 (L) 19 - 32 mmol/L   Glucose, Bld 159 (H) 70 - 99 mg/dL   BUN 59 (H) 6 - 23 mg/dL   Creatinine, Ser 3.77 (H) 0.50 - 1.10 mg/dL   Calcium 8.4 8.4 - 10.5 mg/dL   GFR calc non Af Amer 12 (L) >90 mL/min   GFR calc Af Amer 14 (L) >90 mL/min    Comment: (NOTE) The eGFR has been calculated using the CKD EPI equation. This calculation has not been validated in all clinical situations. eGFR's persistently <90 mL/min signify possible Chronic Kidney Disease.    Anion gap 9 5 - 15  Glucose, capillary     Status: Abnormal   Collection Time: 02/26/14  6:19 PM  Result Value Ref Range   Glucose-Capillary 135 (H) 70 - 99 mg/dL   Comment 1 Notify RN   Glucose, capillary     Status: Abnormal   Collection Time:  02/26/14  7:36 PM  Result Value Ref Range   Glucose-Capillary 137 (H) 70 - 99 mg/dL   Comment 1 Notify RN   Glucose, capillary     Status: Abnormal  Collection Time: 02/26/14  8:40 PM  Result Value Ref Range   Glucose-Capillary 138 (H) 70 - 99 mg/dL   Comment 1 Notify RN   Glucose, capillary     Status: Abnormal   Collection Time: 02/26/14  9:27 PM  Result Value Ref Range   Glucose-Capillary 179 (H) 70 - 99 mg/dL   Comment 1 Notify RN   Glucose, capillary     Status: Abnormal   Collection Time: 02/26/14 10:36 PM  Result Value Ref Range   Glucose-Capillary 215 (H) 70 - 99 mg/dL   Comment 1 Notify RN   Glucose, capillary     Status: Abnormal   Collection Time: 02/26/14 11:31 PM  Result Value Ref Range   Glucose-Capillary 210 (H) 70 - 99 mg/dL   Comment 1 Notify RN   Glucose, capillary     Status: Abnormal   Collection Time: 02/27/14 12:35 AM  Result Value Ref Range   Glucose-Capillary 189 (H) 70 - 99 mg/dL   Comment 1 Notify RN   Glucose, capillary     Status: Abnormal   Collection Time: 02/27/14  1:40 AM  Result Value Ref Range   Glucose-Capillary 161 (H) 70 - 99 mg/dL   Comment 1 Notify RN   Glucose, capillary     Status: Abnormal   Collection Time: 02/27/14  2:36 AM  Result Value Ref Range   Glucose-Capillary 162 (H) 70 - 99 mg/dL   Comment 1 Notify RN   CBC     Status: Abnormal   Collection Time: 02/27/14  3:11 AM  Result Value Ref Range   WBC 17.9 (H) 4.0 - 10.5 K/uL   RBC 2.60 (L) 3.87 - 5.11 MIL/uL   Hemoglobin 8.3 (L) 12.0 - 15.0 g/dL   HCT 23.7 (L) 36.0 - 46.0 %   MCV 91.2 78.0 - 100.0 fL   MCH 31.9 26.0 - 34.0 pg   MCHC 35.0 30.0 - 36.0 g/dL   RDW 15.0 11.5 - 15.5 %   Platelets 137 (L) 150 - 400 K/uL  Basic metabolic panel     Status: Abnormal   Collection Time: 02/27/14  3:11 AM  Result Value Ref Range   Sodium 137 135 - 145 mmol/L    Comment: Please note change in reference range.   Potassium 3.4 (L) 3.5 - 5.1 mmol/L    Comment: Please note change  in reference range.   Chloride 109 96 - 112 mEq/L   CO2 18 (L) 19 - 32 mmol/L   Glucose, Bld 168 (H) 70 - 99 mg/dL   BUN 64 (H) 6 - 23 mg/dL   Creatinine, Ser 4.35 (H) 0.50 - 1.10 mg/dL   Calcium 8.4 8.4 - 10.5 mg/dL   GFR calc non Af Amer 10 (L) >90 mL/min   GFR calc Af Amer 12 (L) >90 mL/min    Comment: (NOTE) The eGFR has been calculated using the CKD EPI equation. This calculation has not been validated in all clinical situations. eGFR's persistently <90 mL/min signify possible Chronic Kidney Disease.    Anion gap 10 5 - 15  Glucose, capillary     Status: Abnormal   Collection Time: 02/27/14  3:49 AM  Result Value Ref Range   Glucose-Capillary 147 (H) 70 - 99 mg/dL   Comment 1 Notify RN   Blood gas, arterial     Status: Abnormal   Collection Time: 02/27/14  3:58 AM  Result Value Ref Range   FIO2 40.00 %   Delivery systems VENTILATOR  Mode PRESSURE REGULATED VOLUME CONTROL    VT 450 mL   Rate 15 resp/min   Peep/cpap 5.0 cm H20   pH, Arterial 7.240 (L) 7.350 - 7.450   pCO2 arterial 41.5 35.0 - 45.0 mmHg   pO2, Arterial 167.0 (H) 80.0 - 100.0 mmHg   Bicarbonate 17.2 (L) 20.0 - 24.0 mEq/L   TCO2 16.5 0 - 100 mmol/L   Acid-base deficit 8.9 (H) 0.0 - 2.0 mmol/L   O2 Saturation 98.9 %   Patient temperature 37.0    Collection site LEFT RADIAL    Drawn by [id number]    Sample type ARTERIAL    Allens test (pass/fail) PASS PASS  Glucose, capillary     Status: Abnormal   Collection Time: 02/27/14  4:49 AM  Result Value Ref Range   Glucose-Capillary 148 (H) 70 - 99 mg/dL   Comment 1 Notify RN   Glucose, capillary     Status: Abnormal   Collection Time: 02/27/14  5:43 AM  Result Value Ref Range   Glucose-Capillary 132 (H) 70 - 99 mg/dL   Comment 1 Notify RN   Glucose, capillary     Status: Abnormal   Collection Time: 02/27/14  6:36 AM  Result Value Ref Range   Glucose-Capillary 138 (H) 70 - 99 mg/dL   Comment 1 Notify RN     ABGS  Recent Labs  02/27/14 0358  PHART  7.240*  PO2ART 167.0*  TCO2 16.5  HCO3 17.2*   CULTURES Recent Results (from the past 240 hour(s))  MRSA PCR Screening     Status: None   Collection Time: 02/24/14  4:13 PM  Result Value Ref Range Status   MRSA by PCR NEGATIVE NEGATIVE Final    Comment:        The GeneXpert MRSA Assay (FDA approved for NASAL specimens only), is one component of a comprehensive MRSA colonization surveillance program. It is not intended to diagnose MRSA infection nor to guide or monitor treatment for MRSA infections.   Culture, blood (routine x 2) Call MD if unable to obtain prior to antibiotics being given     Status: None (Preliminary result)   Collection Time: 02/24/14  6:09 PM  Result Value Ref Range Status   Specimen Description BLOOD RIGHT HAND  Final   Special Requests BOTTLES DRAWN AEROBIC AND ANAEROBIC 6CC  Final   Culture NO GROWTH 2 DAYS  Final   Report Status PENDING  Incomplete  Culture, blood (routine x 2) Call MD if unable to obtain prior to antibiotics being given     Status: None (Preliminary result)   Collection Time: 02/24/14  6:25 PM  Result Value Ref Range Status   Specimen Description A-LINE  Final   Special Requests   Final    BOTTLES DRAWN AEROBIC AND ANAEROBIC [place] DRAWN BY RN   Culture NO GROWTH 2 DAYS  Final   Report Status PENDING  Incomplete  Culture, respiratory (NON-Expectorated)     Status: None (Preliminary result)   Collection Time: 02/24/14 11:35 PM  Result Value Ref Range Status   Specimen Description SPUTUM  Final   Special Requests NONE  Final   Gram Stain   Final    ABUNDANT WBC PRESENT, PREDOMINANTLY PMN NO SQUAMOUS EPITHELIAL CELLS SEEN ABUNDANT GRAM POSITIVE COCCI IN PAIRS Performed at [facility]    Culture   Final    Culture reincubated for better growth Performed at [facility]    Report Status PENDING  Incomplete  Studies/Results: US Renal  02/25/2014   CLINICAL DATA:  Acute renal failure, increased BUN and  creatinine  EXAM: RENAL/URINARY TRACT ULTRASOUND COMPLETE  COMPARISON:  None.  FINDINGS: Right Kidney:  Length: 12.0 cm. Echogenicity within normal limits. No mass or hydronephrosis visualized. Small amount of perinephric fluid noted lower pole.  Left Kidney:  Length: 13.2 cm. Echogenicity within normal limits. No mass or hydronephrosis visualized.  Bladder:  There is a Foley catheter within urinary bladder. Some debris noted within dependent gallbladder.  IMPRESSION: No hydronephrosis. No diagnostic renal calculus. Small perinephric fluid lower pole of the right kidney. Foley catheter noted within a decompressed urinary bladder. Probable small amount of debris within bladder.   Electronically Signed   By: Lahoma Crocker M.D.   On: 02/25/2014 13:24   Dg Chest Port 1 View  02/26/2014   CLINICAL DATA:  Ventilation.  EXAM: PORTABLE CHEST - 1 VIEW  COMPARISON:  None.  FINDINGS: Tracheostomy tube, NG tube, right PICC line in stable position. Cardiomegaly with normal pulmonary vascularity. Bilateral patchy pulmonary infiltrates. Small left pleural effusion cannot be excluded. No pneumothorax. No acute osseous abnormality.  IMPRESSION: 1. Lines and tubes in stable position. 2. Persistent bilateral dense pulmonary infiltrates most likely secondary pneumonia. No interim change. Small left pleural effusion. 3. Stable cardiomegaly.  Normal pulmonary vascularity.   Electronically Signed   By: Marcello Moores  Register   On: 02/26/2014 07:26    Medications:  Prior to Admission:  No prescriptions prior to admission   Scheduled: . antiseptic oral rinse  7 mL Mouth Rinse QID  . chlorhexidine  15 mL Mouth Rinse BID  . clotrimazole  1 Applicatorful Vaginal QHS  . ipratropium-albuterol  3 mL Nebulization Q4H  . pantoprazole (PROTONIX) IV  40 mg Intravenous QHS  . piperacillin-tazobactam (ZOSYN)  IV  3.375 g Intravenous Q8H  . potassium chloride  10 mEq Intravenous Q1 Hr x 4  . sodium chloride  10-40 mL Intracatheter Q12H  .  vancomycin  1,000 mg Intravenous Q48H   Continuous: . dextrose 5 % 1,000 mL with sodium bicarbonate 75 mEq infusion 100 mL/hr at 02/27/14 0700  . fentaNYL infusion INTRAVENOUS 250 mcg/hr (02/27/14 0700)  . insulin (NOVOLIN-R) infusion 0.7 Units/hr (02/27/14 0745)   GYF:[id number], albuterol, artificial tears, fentaNYL, midazolam, ondansetron (ZOFRAN) IV, sodium chloride  Assesment: She has acute hypoxic respiratory failure multifactorial. She has aspiration pneumonia. She came in with diabetic ketoacidosis. She has acute renal failure. She was septic on admission. She continues to have a fairly profound metabolic acidosis despite being on sodium bicarbonate continuous infusion. No opportunity for weaning at this point. Active Problems:   Acute encephalopathy   DKA (diabetic ketoacidoses)   Acute respiratory failure with hypoxia   Acute renal failure   Sepsis with acute organ dysfunction   Aspiration pneumonia   Metabolic acidosis   Sepsis    Plan: Await improvement in her renal function and metabolic acidosis. Continue sodium bicarbonate drip. Continue ventilator support for now. Continue antibiotics.    LOS: 3 days   Amere Iott L 02/27/2014, 8:04 AM

## 2014-02-27 NOTE — Progress Notes (Signed)
[facility]TRANSFER REPORT CALLED TO PRICILLA MOYER RN ON 79M. PT REMAINS ON VENT. 7.5 ETT. 40% TV 450, PEEP 5, PS 15, O2 SAT 100%.OG TUBE PATENT DRAINING SM AMT BILE FLUID. FOLEY CATH PATENT DRAINING SM AMT CLEAR YELLOW URINE. RT TRIPLE LUMEN PICC AND LT ARM IV'S X2 ALL PATENT. FENTANYL @250MCG , BICARB DRIP @ 100CC/HR. INSULIN DRIP @ 2.8 UNITS/HR.  CARELINK  STAFF HERE TRANSPORT PT TO [place]. FAMILY HAS ALREADY LEFT TO MEET PT @ HOSPITAL.

## 2014-02-27 NOTE — H&P (Signed)
PULMONARY / CRITICAL CARE MEDICINE   Name: Jackie Fisher MRN: [id number] DOB: 05/23/1955    ADMISSION DATE:  02/24/2014 CONSULTATION DATE:  02/27/2014  REFERRING MD :  Forestine Na  CHIEF COMPLAINT:  DKA, persistent acidosis, b/l PNA.  INITIAL PRESENTATION:   Came in with DKA 1/17 to [facility]. Found to be in AKI, septic on admission, hypoxic resp failure, aspiration pna. Profoundly acidotic despite bicarb gtt. Was taken off insulin gtt and got hyperglycemic so was put back on it. Still having non gap acidosis despite being on bicarb drip. Sputum cx growing strep pneumo, final not back. Intubated and remains on vent. On vent 40%, peep 5, ps 15, o2 100%. On bicarb drip and insulin drip. Has AMS, CT head normal.   STUDIES:  1/17 - ct head - nl. Ct cspine no acute changes, c5/c6 mild-mod central canal narrowing.  1/17 - b/l airspaces opacities. 1/18 - renal u/s - no hydro.  1/19 - ab xray - no obstruction.  1/17, 1/18, 1/19 CXR - b/l extensive airspace opacities, likely pna. Cardiomegaly.   SIGNIFICANT EVENTS: 1/17 - admitted. DKA, severe sepsis. Started on vanc+zosyn. and insulin gtt, DKA protocol. Intubated for AMS 1/18, 1/19. Gap closed but had hyperglycemia off insulin gtt so put back on it. Continued to have non gap acidosis. White count trending down. hgb dropped from 11.8 to 8-9. plt dropped from 323 --> 137. HIIT panel sent.    HISTORY OF PRESENT ILLNESS:    59 yo female no known med hx presented to [facility] ED 1/17 after found to be unresponsive. Was hypothermic, tachypneic, tachycardic, severe sepsis, b/l PNA, DKA with severe metabolic acidosis. Was intubated in ED. Thought to have developed DKA >> encephalopathy >> asp pna >> sepsis. Was placed on insulin gtt, abx, and pulm and nephro consulted. hgba1c 13.2. AKI failed to improve with IV fluids. CT head done for ams., neg. Had high anion gap acidosis which resolved. Now has NAGAcidosis. Also had anemia and thrombocytopenia. Per  discussion with family today, she was doing well before Saturday. On Saturday 1/16 she was having some cough and cold symptoms, her brother was sick who lives next to her. Then on Sunday she was walking back from her bathroom and was found down on the floor in her house by her brother who was incidentally checking on her. Has hx of diabetes per sister but was not taking any medication per sisters. No other medical problems.   PAST MEDICAL HISTORY :   has no past medical history on file.  has no past surgical history on file. Prior to Admission medications   Not on File   No Known Allergies  FAMILY HISTORY:  has no family status information on file.  SOCIAL HISTORY:   Works at [employer].   REVIEW OF SYSTEMS:  Unable to obtain b/c of sedation.  SUBJECTIVE: sedated, intubated.   VITAL SIGNS: Temp:  [97.4 F (36.3 C)-98.6 F (37 C)] 97.4 F (36.3 C) (01/20 1802) Pulse Rate:  [74-89] 74 (01/20 1919) Resp:  [12-26] 20 (01/20 1919) BP: (105-137)/(45-88) 127/63 mmHg (01/20 1830) SpO2:  [98 %-100 %] 100 % (01/20 1919) FiO2 (%):  [40 %] 40 % (01/20 1919) Weight:  [80.9 kg (178 lb 5.6 oz)-81.2 kg (179 lb 0.2 oz)] 80.9 kg (178 lb 5.6 oz) (01/20 1800) HEMODYNAMICS:   VENTILATOR SETTINGS: Vent Mode:  [-] PRVC FiO2 (%):  [40 %] 40 % Set Rate:  [15 bmp-20 bmp] 20 bmp Vt Set:  [450 mL-490  mL] 490 mL PEEP:  [5 cmH20] 5 cmH20 Plateau Pressure:  [14 cmH20-24 cmH20] 22 cmH20 INTAKE / OUTPUT:  Intake/Output Summary (Last 24 hours) at 02/27/14 1931 Last data filed at 02/27/14 1828  Gross per 24 hour  Intake 3666.65 ml  Output    650 ml  Net 3016.65 ml    PHYSICAL EXAMINATION: General:  Intubated, sedated, obese female. Neuro:  Opens her eyes upon verbal stimulus, doesn't follow commands. HEENT:  Perrl, eomi. /at.  Cardiovascular:  Rrr, no m/r/g Lungs:  ctab anteriorly. Abdomen:  Soft, nt, nd. Obese. Musculoskeletal:  No edema, no injuries visible.  Skin:  No  lesions.  LABS:  CBC  Recent Labs Lab 02/25/14 0442 02/26/14 0545 02/27/14 0311  WBC 23.7* 24.2* 17.9*  HGB 9.4* 8.8* 8.3*  HCT 27.6* 25.5* 23.7*  PLT 229 175 137*   Coag's No results for input(s): APTT, INR in the last 168 hours. BMET  Recent Labs Lab 02/26/14 0545 02/26/14 1749 02/27/14 0311  NA 136 138 137  K 3.5 3.3* 3.4*  CL 111 112 109  CO2 16* 17* 18*  BUN 55* 59* 64*  CREATININE 3.38* 3.77* 4.35*  GLUCOSE 349* 159* 168*   Electrolytes  Recent Labs Lab 02/26/14 0545 02/26/14 1749 02/27/14 0311  CALCIUM 8.6 8.4 8.4   Sepsis Markers  Recent Labs Lab 02/24/14 1825 02/27/14 1159  LATICACIDVEN 1.8 0.8  PROCALCITON  --  24.81   ABG  Recent Labs Lab 02/26/14 0445 02/26/14 1735 02/27/14 0358  PHART 7.174* 7.194* 7.240*  PCO2ART 41.4 42.0 41.5  PO2ART 159.0* 148.0* 167.0*   Liver Enzymes  Recent Labs Lab 02/26/14 0545  AST 21  ALT 19  ALKPHOS 151*  BILITOT 0.6  ALBUMIN 1.7*   Cardiac Enzymes  Recent Labs Lab 02/24/14 1029  TROPONINI <0.03   Glucose  Recent Labs Lab 02/27/14 1150 02/27/14 1255 02/27/14 1429 02/27/14 1505 02/27/14 1610 02/27/14 1741  GLUCAP 216* 205* 156* 151* 130* 124*    Imaging Dg Chest Port 1 View  02/26/2014   CLINICAL DATA:  Ventilation.  EXAM: PORTABLE CHEST - 1 VIEW  COMPARISON:  None.  FINDINGS: Tracheostomy tube, NG tube, right PICC line in stable position. Cardiomegaly with normal pulmonary vascularity. Bilateral patchy pulmonary infiltrates. Small left pleural effusion cannot be excluded. No pneumothorax. No acute osseous abnormality.  IMPRESSION: 1. Lines and tubes in stable position. 2. Persistent bilateral dense pulmonary infiltrates most likely secondary pneumonia. No interim change. Small left pleural effusion. 3. Stable cardiomegaly.  Normal pulmonary vascularity.   Electronically Signed   By: Marcello Moores  Register   On: 02/26/2014 07:26     ASSESSMENT / PLAN:  PULMONARY OETT 9/35  >> A: Metabolic acidosis with uncompensation. B/l pneumonia, likely aspiration   P:   Increase resp rate to 20 for better pulm compensation of her acidosis. - repeat abg Cont vent. Cont abx for pna Keep on min O2 as able to sats goal 92% Overall some improvement in infiltrates pcxr in am   CARDIOVASCULAR CVL PICC right basilic 7/01 >> A:  Normotensive cvp qshift. P:  Maintain normotension.  Dc line when able  RENAL A:   Initially gap met acidosis 2/2 to DKA gap 29 bicarb 7.  Now nongap acidosis small - fro prior saline, r/o RTA Lactic acid normal. Ketones in UA initially AKI on CKD - on admit crt was 1.83, now 4.35c likely 2/2 to ATN P:   Stop bicarb drip. Bicarb almost normalizing. Monitor BMET. If bicarb >20,  transition to long acting insulin + SSI and continue d5w.  Nephrology was consulted previously at [facility]. Thought AKI due to oliguric ATN with proteinuria. Various studies were done to find cause of AKI including hep B, ANCA, and complements. All normal. Will consult nephro here in AM likely Monitor UOP.  Avoid nephrotoxins.  Avoid saline  GASTROINTESTINAL A:   NPO for now.  P:   Start TF and transition to long acting insulin.  HEMATOLOGIC A:   Anemia acute - on admit 11.8, now 8.3. Likely due to acute illness. No apparent blood loss. Leukocytosis - improving Thrombocytopenia - initially plt 323. Now 137. Concerning for HIT.  P:  HIT panel sent. Held heparin. SCD for dvt ppx.   INFECTIOUS A:   Strep pneum + pneumonia, b/l. Initial wbc 32.9, trending down.  P:   BCx2 1/17 >> Sputum 1/17 >> growing gpc in pairs, likely strep pneum, pending final. vanc 1/17 >> Zosyn 1/17 >>  No pct on admission. Now 20's today. Not sure what it was on admission so hard to tell if responding. I do not see a role further for PCT, this is Strep PNA and requires 8 days abx Follow sens pattern and narrow when able to ceftriaxone if able  ENDOCRINE A:   ?Hx  diabetes. Came in with DKA likely induced by pneumonia P:   Transition to long acting insulin with 10 unit lantus + SSI   NEUROLOGIC A:  AMS likely 2/2 to metabolic and infectious etiology CT head normal P:   RASS goal: 0 to -1. Titrate fentanyl gtt PRN fentanyl   FAMILY  - Updates:  1/20. Per sisters, patient wants to be DNR but they are not sure who has POA. Will keep full code for now since she was healthy before this happened. Will wait for them to find records if someone has medical POA.   - Inter-disciplinary family meet or Palliative Care meeting due by:  03/06/2014    TODAY'S SUMMARY:  Transferred from [facility], initially admitted with b/l pneumonia, DKA, severe gap metabolic acidosis and AMS. Remains non gap acidotic. Will d/c bicarb drip, monitor BMET. Transition to IV insulin. Start tube feeds.     Dellia Nims, MD, PGY-1.  Pulmonary and [place] Pager: [phone]  02/27/2014, 7:31 PM   STAFF NOTE: I, Merrie Roof, MD FACP have personally reviewed patient's available data, including medical history, events of note, physical examination and test results as part of my evaluation. I have discussed with resident/NP and other care providers such as pharmacist, RN and RRT. In addition, I personally evaluated patient and elicited key findings ER:DEYC increase, abg to follow, neurostatus good, coarse BS, pcxr some clearing, transition to lantus, ssi, dc bicarb, dc pct  The patient is critically ill with multiple organ systems failure and requires high complexity decision making for assessment and support, frequent evaluation and titration of therapies, application of advanced monitoring technologies and extensive interpretation of multiple databases.   Critical Care Time devoted to patient care services described in this note is30 Minutes. This time reflects time of care of this signee: Merrie Roof, MD FACP. This critical care  time does not reflect procedure time, or teaching time or supervisory time of PA/NP/Med student/Med Resident etc but could involve care discussion time. Rest per NP/medical resident whose note is outlined above and that I agree with   Lavon Paganini. Titus Mould, MD, [place] Pgr: [facility] 02/27/2014 9:40 PM

## 2014-02-27 NOTE — Progress Notes (Signed)
ANTIBIOTIC CONSULT NOTE  Pharmacy Consult for Vancomycin and Zosyn  Indication: pneumonia and rule out sepsis  No Known Allergies  Patient Measurements: Height: 5\' 7"  (170.2 cm) Weight: 179 lb 0.2 oz (81.2 kg) IBW/kg (Calculated) : 61.6  Vital Signs: Temp: 97.6 F (36.4 C) (01/20 0829) Temp Source: Core (Comment) (01/20 0829) BP: 131/59 mmHg (01/20 1045) Pulse Rate: 82 (01/20 1045) Intake/Output from previous day: 01/19 0701 - 01/20 0700 In: 3813.1 [I.V.:3163.1; IV Piggyback:650] Out: 500 [Urine:500] Intake/Output from this shift: Total I/O In: 608.2 [I.V.:508.2; IV Piggyback:100] Out: -   Labs:  Recent Labs  02/25/14 0442  02/26/14 0545 02/26/14 1749 02/27/14 0311  WBC 23.7*  --  24.2*  --  17.9*  HGB 9.4*  --  8.8*  --  8.3*  PLT 229  --  175  --  137*  CREATININE 1.73*  < > 3.38* 3.77* 4.35*  < > = values in this interval not displayed. Estimated Creatinine Clearance: 15.4 mL/min (by C-G formula based on Cr of 4.35). No results for input(s): VANCOTROUGH, VANCOPEAK, VANCORANDOM, GENTTROUGH, GENTPEAK, GENTRANDOM, TOBRATROUGH, TOBRAPEAK, TOBRARND, AMIKACINPEAK, AMIKACINTROU, AMIKACIN in the last 72 hours.   Microbiology: Recent Results (from the past 720 hour(s))  MRSA PCR Screening     Status: None   Collection Time: 02/24/14  4:13 PM  Result Value Ref Range Status   MRSA by PCR NEGATIVE NEGATIVE Final    Comment:        The GeneXpert MRSA Assay (FDA approved for NASAL specimens only), is one component of a comprehensive MRSA colonization surveillance program. It is not intended to diagnose MRSA infection nor to guide or monitor treatment for MRSA infections.   Culture, blood (routine x 2) Call MD if unable to obtain prior to antibiotics being given     Status: None (Preliminary result)   Collection Time: 02/24/14  6:09 PM  Result Value Ref Range Status   Specimen Description BLOOD RIGHT HAND  Final   Special Requests BOTTLES DRAWN AEROBIC AND  ANAEROBIC 6CC  Final   Culture NO GROWTH 3 DAYS  Final   Report Status PENDING  Incomplete  Culture, blood (routine x 2) Call MD if unable to obtain prior to antibiotics being given     Status: None (Preliminary result)   Collection Time: 02/24/14  6:25 PM  Result Value Ref Range Status   Specimen Description BLOOD PICC LINE DRAWN BY RN  Final   Special Requests BOTTLES DRAWN AEROBIC AND ANAEROBIC 6CC  Final   Culture NO GROWTH 3 DAYS  Final   Report Status PENDING  Incomplete  Culture, respiratory (NON-Expectorated)     Status: None (Preliminary result)   Collection Time: 02/24/14 11:35 PM  Result Value Ref Range Status   Specimen Description SPUTUM  Final   Special Requests NONE  Final   Gram Stain   Final    ABUNDANT WBC PRESENT, PREDOMINANTLY PMN NO SQUAMOUS EPITHELIAL CELLS SEEN ABUNDANT GRAM POSITIVE COCCI IN PAIRS Performed at [facility]    Culture   Final    STREPTOCOCCUS PNEUMONIAE Performed at [facility]    Report Status PENDING  Incomplete    Anti-infectives    Start     Dose/Rate Route Frequency Ordered Stop   02/27/14 1700  vancomycin (VANCOCIN) IVPB 1000 mg/200 mL premix     1,000 mg200 mL/hr over 60 Minutes Intravenous Every 48 hours 02/26/14 1254     02/27/14 1400  piperacillin-tazobactam (ZOSYN) IVPB 2.25 g  2.25 g100 mL/hr over 30 Minutes Intravenous 3 times per day 02/27/14 0816     02/24/14 1800  vancomycin (VANCOCIN) IVPB 1000 mg/200 mL premix  Status:  Discontinued     1,000 mg200 mL/hr over 60 Minutes Intravenous Every 24 hours 02/24/14 1602 02/26/14 1254   02/24/14 1700  piperacillin-tazobactam (ZOSYN) IVPB 3.375 g  Status:  Discontinued     3.375 g12.5 mL/hr over 240 Minutes Intravenous Every 8 hours 02/24/14 1602 02/27/14 0815   02/24/14 1215  cefTRIAXone (ROCEPHIN) 1 g in dextrose 5 % 50 mL IVPB     1 g100 mL/hr over 30 Minutes Intravenous  Once 02/24/14 1208 02/24/14 1416   02/24/14 1215  azithromycin (ZITHROMAX) 500 mg in  dextrose 5 % 250 mL IVPB     500 mg250 mL/hr over 60 Minutes Intravenous  Once 02/24/14 1208 02/24/14 1318     Assessment: 58 yoF found unresponsive at home.  On admission she was found to be in DKA, hypothermic, and CXR +PNA.  She was intubated and started on empiric, broad-spectrum antibiotics for potential aspiration PNA/Sepsis.   She is currently afebrile and WBC trending down.  She remains intubated. Respiratory cx +GPC.  Blood cx NGTD. Renal function continues to worsen.  Renal consulted.   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  Estimated CrCl ~ 15-66ml/min.    Goal of Therapy:  Vancomycin trough level 15-20 mcg/ml  Plan:  Decrease Zosyn to 2.25gm IV q8h Follow-up micro data, labs, vitals  Decrease Vancomycin 1gm IV every 48 hours Check Vancomycin trough at steady state Duration of therapy per MD  Valrie Hart A 02/27/2014,11:45 AM

## 2014-02-27 NOTE — Progress Notes (Signed)
Jackie BannisterLula D Fisher  MRN: [id number]  DOB/AGE: [birth year] 59 y.o.  Primary Care Physician:No primary care provider on file.  Admit date: 02/24/2014  Chief Complaint:  Chief Complaint  Patient presents with  . unresponsive     S-Pt presented on  02/24/2014 with  Chief Complaint  Patient presents with  . unresponsive   .    Pt intubated, unable to offer any complaints .  Meds . antiseptic oral rinse  7 mL Mouth Rinse QID  . chlorhexidine  15 mL Mouth Rinse BID  . clotrimazole  1 Applicatorful Vaginal QHS  . fluconazole (DIFLUCAN) IV  100 mg Intravenous Q24H  . ipratropium-albuterol  3 mL Nebulization Q4H  . pantoprazole (PROTONIX) IV  40 mg Intravenous QHS  . piperacillin-tazobactam (ZOSYN)  IV  2.25 g Intravenous 3 times per day  . sodium chloride  10-40 mL Intracatheter Q12H  . vancomycin  1,000 mg Intravenous Q48H        Physical Exam: Vital signs in last 24 hours: Temp:  [97.6 F (36.4 C)-98.6 F (37 C)] 98.6 F (37 C) (01/20 1304) Pulse Rate:  [78-94] 87 (01/20 1430) Resp:  [12-24] 17 (01/20 1430) BP: (105-137)/(45-88) 121/52 mmHg (01/20 1430) SpO2:  [98 %-100 %] 99 % (01/20 1430) FiO2 (%):  [40 %] 40 % (01/20 1131) Weight:  [179 lb 0.2 oz (81.2 kg)] 179 lb 0.2 oz (81.2 kg) (01/20 0500) Weight change: 3 lb 1.4 oz (1.4 kg) Last BM Date:  (unknown)  Intake/Output from previous day: 01/19 0701 - 01/20 0700 In: 3813.1 [I.V.:3163.1; IV Piggyback:650] Out: 500 [Urine:500] Total I/O In: 739.4 [I.V.:639.4; IV Piggyback:100] Out: -    Physical Exam: General- intubated Resp- ET tube insitu,Rhonchi + CVS- S1S2 regular in rate and rhythm GIT- BS+, soft, NT, ND EXT- Trace LE Edema, Cyanosis   Lab Results: CBC  Recent Labs  02/26/14 0545 02/27/14 0311  WBC 24.2* 17.9*  HGB 8.8* 8.3*  HCT 25.5* 23.7*  PLT 175 137*    BMET  Recent Labs  02/26/14 1749 02/27/14 0311  NA 138 137  K 3.3* 3.4*  CL 112 109  CO2 17* 18*  GLUCOSE 159* 168*  BUN 59* 64*   CREATININE 3.77* 4.35*  CALCIUM 8.4 8.4   Trend Creat 2016 1.83=>2.25=>3.38=>4.35   MICRO Recent Results (from the past 240 hour(s))  MRSA PCR Screening     Status: None   Collection Time: 02/24/14  4:13 PM  Result Value Ref Range Status   MRSA by PCR NEGATIVE NEGATIVE Final    Comment:        The GeneXpert MRSA Assay (FDA approved for NASAL specimens only), is one component of a comprehensive MRSA colonization surveillance program. It is not intended to diagnose MRSA infection nor to guide or monitor treatment for MRSA infections.   Culture, blood (routine x 2) Call MD if unable to obtain prior to antibiotics being given     Status: None (Preliminary result)   Collection Time: 02/24/14  6:09 PM  Result Value Ref Range Status   Specimen Description BLOOD RIGHT HAND  Final   Special Requests BOTTLES DRAWN AEROBIC AND ANAEROBIC 6CC  Final   Culture NO GROWTH 3 DAYS  Final   Report Status PENDING  Incomplete  Culture, blood (routine x 2) Call MD if unable to obtain prior to antibiotics being given     Status: None (Preliminary result)   Collection Time: 02/24/14  6:25 PM  Result Value Ref Range Status   Specimen  Description BLOOD PICC LINE DRAWN BY RN  Final   Special Requests BOTTLES DRAWN AEROBIC AND ANAEROBIC 6CC  Final   Culture NO GROWTH 3 DAYS  Final   Report Status PENDING  Incomplete  Culture, respiratory (NON-Expectorated)     Status: None (Preliminary result)   Collection Time: 02/24/14 11:35 PM  Result Value Ref Range Status   Specimen Description SPUTUM  Final   Special Requests NONE  Final   Gram Stain   Final    ABUNDANT WBC PRESENT, PREDOMINANTLY PMN NO SQUAMOUS EPITHELIAL CELLS SEEN ABUNDANT GRAM POSITIVE COCCI IN PAIRS Performed at [facility]    Culture   Final    STREPTOCOCCUS PNEUMONIAE Performed at [facility]    Report Status PENDING  Incomplete      Lab Results  Component Value Date   CALCIUM 8.4 02/27/2014         Renal U/s Right Kidney: 12.0 cm. Left Kidney: 13.2 cm.   CXR 1. Lines and tubes in stable position. 2. Persistent bilateral dense pulmonary infiltrates most likely secondary pneumonia. No interim change. Small left pleural effusion. 3. Stable cardiomegaly. Normal pulmonary vascularity.  Impression: 1)Renal  AKI secondary to Prerenal/ATN               AKI on CKD ?                   Not much data available for CKD                    Only data is u/a showing proteinuria and creat high at presentation                            AKI worsening                Oliguric ATN                CKD stage 3?                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  5)DM- admitted with DKA Primary MD following  6)Electrolytes Hypokalemic    Being repleted.  NOrmonatremic   7)Acid base High AG acidosis NON AG acidosis Resp acidosis   8) ID- Admitted with Pneumonia/Sepsis On IV ABX      Plan:  Pt with worsening  AKI , will need renal replacement therapy. Pt being transferred to [facility].     Norval Slaven S 02/27/2014, 2:31 PM

## 2014-02-27 NOTE — Progress Notes (Signed)
PROGRESS NOTE  Jackie BannisterLula D Fisher HQI:[id number] DOB: October 28, 1955 DOA: 02/24/2014 PCP: No primary care provider on file.  Summary: 59 year old woman with no known medical history presented to the emergency department 1/17 after family members found her to be unresponsive. Downtime unknown. Initial evaluation revealed hypothermia, tachypnea, tachycardia, findings suggesting severe sepsis, bilateral pneumonia, DKA with severe metabolic acidosis. She was intubated in the emergency department. Although history is very limited, it is felt that the patient developed DKA >> encephalopathy >> aspiration pneumonia >> sepsis. She is placed on insulin infusion and broad-spectrum antibiotics, seen in consultation with pulmonology and nephrology. She became very hyperglycemic off insulin infusion which was restarted.  Assessment/Plan: 1. Severe sepsis with MOD. Hypothermia, tachycardia and tachypnea on admission. CK and lactate normal on admission. Remains critically ill. 2. Acute hypoxic respiratory failure requiring mechanical ventilation. Secondary to pneumonia. 3. DKA. Acidosis resolved. Bicarbonate normal. No known history of diabetes mellitus. Hemoglobin A1c 13.2. Blood sugars stable. 4. Bilateral pneumonia, consider aspiration based on clinical history. FiO2 40% on the ventilator with 100% SPO2. 5. Acute kidney injury, oliguric. Differential includes sepsis, severe prerenal, ATN. Failed to improve with IV fluids. Consider underlying chronic kidney disease. Renal ultrasound unremarkable. 6. Acute encephalopathy. CT head no acute findings. 7. Mixed metabolic acidosis: High anion gap acidosis resolved, non-anion gap acidosis and respiratory acidosis persist. 8. Anemia of critical illness. Appears stable. No evidence of bleeding. Baseline unknown. 9. Thrombocytopenia. Likely related to sepsis. Has been on heparin which was discontinued 1/20. Send HIT panel.   Patient remains critically ill, ventilator dependent,  worsening renal function with continued mixed metabolic acidosis despite bicarbonate infusion. Discussed guarded prognosis with 2 sisters at bedside Jackie Fisher(Jackie Fisher and Jackie RougeHilda Fisher). We discussed clinical findings, current diagnoses and critical illness. They expressed desire [facility] for higher level of care.  Discussed case with Dr. Craige CottaSood who has accepted the patient [facility].  Continue empiric antibiotics.  Management of renal function per nephrology, ventilator per pulmonology.  Nutrition consult. No signs or symptoms to suggest ileus or obstruction but check abdominal film before starting tube feeds. If negative, start tube feeds. Continue insulin infusion given how difficult it has been to control her blood sugars.  Stop heparin. Check HIT panel.  CBC and CMP in AM  Code Status: full code DVT prophylaxis: SCDS. heparin stopped 1/20 for low plts Family Communication:  Disposition Plan:   Jackie Sacksaniel Goodrich, MD  [facility]  Pager [phone] If 7PM-7AM, please contact night-coverage at [email or web address], password [email or web address] 02/27/2014, 7:39 AM  LOS: 3 days   Consultants:  Pulmonology  Nephrology  Procedures:  ETT by EDP 1/17 >>  PICC line 1/17 >>  Antibiotics:  Vancomycin 1/17 >>  Zosyn 1/17 >>  HPI/Subjective: Remains intubated and sedated. Sisters at bedside. They report she does have a history of diabetes mellitus possibly, unaware of any treatment.  Nursing reports vaginal yeasty discharge.  Objective: Filed Vitals:   02/27/14 0500 02/27/14 0530 02/27/14 0600 02/27/14 0630  BP: 121/56 118/55 115/55 120/56  Pulse: 83 81 81 80  Temp:      TempSrc:      Resp: 16 16 17 16   Height:      Weight: 81.2 kg (179 lb 0.2 oz)     SpO2: 100% 100% 100% 100%    Intake/Output Summary (Last 24 hours) at 02/27/14 0739 Last data filed at 02/27/14 [id number]  Gross per 24 hour  Intake 3435.54 ml  Output  500 ml  Net 2935.54 ml     Filed  Weights   02/25/14 0500 02/26/14 0500 02/27/14 0500  Weight: 74.1 kg (163 lb 5.8 oz) 79.8 kg (175 lb 14.8 oz) 81.2 kg (179 lb 0.2 oz)    Exam:     Afebrile, vital signs stable. FiO2 40% with oxygen saturation 100%. General: Intubated, sedated. Eyes open, alert but does not follow commands. Eyes: Appear grossly unremarkable ENT: Intubated Cardiovascular: RRR, no m/r/g. No LE edema. Dorsalis pedis pulses 2+ bilaterally. Telemetry: SR, no arrhythmias  Respiratory: CTA bilaterally, no w/r/r. Normal respiratory effort. Abdomen: soft, nondistended, skin appears unremarkable. Mild nonspecific tenderness. Skin: no rash or induration seen  Musculoskeletal: Difficult to assess. Because of sedation. Psychiatric:  Cannot assess Neurologic:  Cannot assess  Data Reviewed:  CBG well controlled  ABG pH 7.24/41/167  Renal function worsening, BUN 64, creatinine 4.35  Hemoglobin stable, 8.3. WBC improved, 17.9. Platelet count significantly lower, 137.  Urine output 500. Fluid balance +7.7.  Pertinent data: Labs  Admission ABG 7.007/27/89  Creatinine 1.83 >> >> 4.35  WBC 32.9 >>  >>17.9  Hgb 11.8 >> >> 8.3  Platelet 323 >> >> 137  Imaging   1/19 Chest x-ray: Persistent bilateral dense pulmonary infiltrates most likely pneumonia  CT cervical spine. No acute abnormality.  CT head. No acute abnormality.  Renal ultrasound. No hydronephrosis. Bladder decompressed.  Pending data:  Sputum culture  Blood cultures  Scheduled Meds: . antiseptic oral rinse  7 mL Mouth Rinse QID  . chlorhexidine  15 mL Mouth Rinse BID  . clotrimazole  1 Applicatorful Vaginal QHS  . heparin  5,000 Units Subcutaneous 3 times per day  . ipratropium-albuterol  3 mL Nebulization Q4H  . pantoprazole (PROTONIX) IV  40 mg Intravenous QHS  . piperacillin-tazobactam (ZOSYN)  IV  3.375 g Intravenous Q8H  . potassium chloride  10 mEq Intravenous Q1 Hr x 4  . sodium chloride  10-40 mL Intracatheter Q12H  .  vancomycin  1,000 mg Intravenous Q48H   Continuous Infusions: . dextrose 5 % 1,000 mL with sodium bicarbonate 75 mEq infusion 100 mL/hr at 02/27/14 0621  . fentaNYL infusion INTRAVENOUS 250 mcg/hr (02/27/14 1610)  . insulin (NOVOLIN-R) infusion 1.6 mL/hr at 02/27/14 9604    Active Problems:   Acute encephalopathy   DKA (diabetic ketoacidoses)   Acute respiratory failure with hypoxia   Acute renal failure   Sepsis with acute organ dysfunction   Aspiration pneumonia   Metabolic acidosis   Sepsis   Time spent 45 minutes, greater than 50% in counseling and coordination of care

## 2014-02-28 ENCOUNTER — Inpatient Hospital Stay (HOSPITAL_COMMUNITY): Payer: Self-pay

## 2014-02-28 DIAGNOSIS — R4182 Altered mental status, unspecified: Secondary | ICD-10-CM | POA: Diagnosis present

## 2014-02-28 DIAGNOSIS — J13 Pneumonia due to Streptococcus pneumoniae: Secondary | ICD-10-CM

## 2014-02-28 DIAGNOSIS — J9600 Acute respiratory failure, unspecified whether with hypoxia or hypercapnia: Secondary | ICD-10-CM | POA: Diagnosis present

## 2014-02-28 DIAGNOSIS — J189 Pneumonia, unspecified organism: Secondary | ICD-10-CM | POA: Insufficient documentation

## 2014-02-28 LAB — CBC
HEMATOCRIT: 24.2 % — AB (ref 36.0–46.0)
HEMOGLOBIN: 8.4 g/dL — AB (ref 12.0–15.0)
MCH: 31 pg (ref 26.0–34.0)
MCHC: 34.7 g/dL (ref 30.0–36.0)
MCV: 89.3 fL (ref 78.0–100.0)
Platelets: 143 10*3/uL — ABNORMAL LOW (ref 150–400)
RBC: 2.71 MIL/uL — ABNORMAL LOW (ref 3.87–5.11)
RDW: 14.6 % (ref 11.5–15.5)
WBC: 14.2 10*3/uL — ABNORMAL HIGH (ref 4.0–10.5)

## 2014-02-28 LAB — POCT I-STAT 3, ART BLOOD GAS (G3+)
Acid-base deficit: 7 mmol/L — ABNORMAL HIGH (ref 0.0–2.0)
Acid-base deficit: 10 mmol/L — ABNORMAL HIGH (ref 0.0–2.0)
BICARBONATE: 16.3 meq/L — AB (ref 20.0–24.0)
Bicarbonate: 17.3 mEq/L — ABNORMAL LOW (ref 20.0–24.0)
O2 SAT: 83 %
O2 Saturation: 100 %
PCO2 ART: 36.5 mmHg (ref 35.0–45.0)
PH ART: 7.358 (ref 7.350–7.450)
PO2 ART: 54 mmHg — AB (ref 80.0–100.0)
TCO2: 18 mmol/L (ref 0–100)
TCO2: 17 mmol/L (ref 0–100)
pCO2 arterial: 30.4 mmHg — ABNORMAL LOW (ref 35.0–45.0)
pH, Arterial: 7.258 — ABNORMAL LOW (ref 7.350–7.450)
pO2, Arterial: 186 mmHg — ABNORMAL HIGH (ref 80.0–100.0)

## 2014-02-28 LAB — CULTURE, RESPIRATORY

## 2014-02-28 LAB — URINALYSIS, ROUTINE W REFLEX MICROSCOPIC
Bilirubin Urine: NEGATIVE
GLUCOSE, UA: 100 mg/dL — AB
Ketones, ur: NEGATIVE mg/dL
Nitrite: NEGATIVE
PH: 6 (ref 5.0–8.0)
Protein, ur: 30 mg/dL — AB
Specific Gravity, Urine: 1.015 (ref 1.005–1.030)
UROBILINOGEN UA: 0.2 mg/dL (ref 0.0–1.0)

## 2014-02-28 LAB — COMPREHENSIVE METABOLIC PANEL
ALBUMIN: 1.3 g/dL — AB (ref 3.5–5.2)
ALK PHOS: 279 U/L — AB (ref 39–117)
ALT: 14 U/L (ref 0–35)
AST: 16 U/L (ref 0–37)
Anion gap: 12 (ref 5–15)
BUN: 70 mg/dL — ABNORMAL HIGH (ref 6–23)
CALCIUM: 8 mg/dL — AB (ref 8.4–10.5)
CHLORIDE: 103 meq/L (ref 96–112)
CO2: 19 mmol/L (ref 19–32)
CREATININE: 4.91 mg/dL — AB (ref 0.50–1.10)
GFR calc non Af Amer: 9 mL/min — ABNORMAL LOW (ref >90)
GFR, EST AFRICAN AMERICAN: 10 mL/min — AB (ref >90)
GLUCOSE: 154 mg/dL — AB (ref 70–99)
Potassium: 3.3 mmol/L — ABNORMAL LOW (ref 3.5–5.1)
Sodium: 134 mmol/L — ABNORMAL LOW (ref 135–145)
Total Bilirubin: 0.6 mg/dL (ref 0.3–1.2)
Total Protein: 5 g/dL — ABNORMAL LOW (ref 6.0–8.3)

## 2014-02-28 LAB — GLUCOSE, CAPILLARY
GLUCOSE-CAPILLARY: 146 mg/dL — AB (ref 70–99)
GLUCOSE-CAPILLARY: 158 mg/dL — AB (ref 70–99)
GLUCOSE-CAPILLARY: 178 mg/dL — AB (ref 70–99)
GLUCOSE-CAPILLARY: 247 mg/dL — AB (ref 70–99)
Glucose-Capillary: 147 mg/dL — ABNORMAL HIGH (ref 70–99)
Glucose-Capillary: 230 mg/dL — ABNORMAL HIGH (ref 70–99)

## 2014-02-28 LAB — CULTURE, RESPIRATORY W GRAM STAIN

## 2014-02-28 LAB — URINE MICROSCOPIC-ADD ON

## 2014-02-28 LAB — CREATININE, URINE, RANDOM: Creatinine, Urine: 34.52 mg/dL

## 2014-02-28 LAB — SODIUM, URINE, RANDOM: Sodium, Ur: 35 mmol/L

## 2014-02-28 MED ORDER — CEFTRIAXONE SODIUM IN DEXTROSE 20 MG/ML IV SOLN
1.0000 g | INTRAVENOUS | Status: DC
  Administered 2014-02-28 – 2014-03-02 (×3): 1 g via INTRAVENOUS
  Filled 2014-02-28 (×3): qty 50

## 2014-02-28 MED ORDER — SODIUM BICARBONATE 8.4 % IV SOLN
INTRAVENOUS | Status: DC
  Administered 2014-02-28 – 2014-03-01 (×2): via INTRAVENOUS
  Filled 2014-02-28 (×5): qty 150

## 2014-02-28 NOTE — Procedures (Signed)
Extubation Procedure Note  Patient Details:   Name: [name] DOB: 1955/04/27 MRN: [id number]   Patient extubated per order to 4 liters of nasal cannula. Vital signs stable no stridor noted. RT will monitor    Evaluation  O2 sats: stable throughout Complications: No apparent complications Patient did tolerate procedure well. Bilateral Breath Sounds: Clear, Diminished Suctioning: Airway Yes  Harley HallmarkKeen, Taariq Leitz Lyman 02/28/2014, 11:46 AM

## 2014-02-28 NOTE — Progress Notes (Signed)
150 mcg fentanyl wasted, witnessed by Manon Hildingachel Fountain

## 2014-02-28 NOTE — Progress Notes (Signed)
NUTRITION FOLLOW UP / CONSULT  Intervention:    Continue to hold TF for extubation.   Diet advancement as tolerated post extubation, recommend regular diet. If PO intake is poor can add Ensure Complete PO BID, each supplement provides 350 kcal and 13 grams of protein   Nutrition Dx:   Inadequate oral intake related to inability to eat as evidenced by NPO status, ongoing.  Goal:   Intake to meet >90% of estimated nutrition needs. Progressing  Monitor:   Diet advancement, PO intake, labs, weight trend, respiratory status.  Assessment:   Pt found unresponsive on the floor of her home. [facility]  TF protocol was initiated last night, received MD Consult for TF initiation and management.  Patient is currently intubated on ventilator support, plans to extubate patient today. TF is currently on hold for extubation. MV: 5.8 L/min Temp (24hrs), Avg:97.9 F (36.6 C), Min:97.3 F (36.3 C), Max:98.6 F (37 C)  Propofol: none   Height: Ht Readings from Last 1 Encounters:  02/27/14 5\' 7"  (1.702 m)    Weight Status:  Increased with positive fluid status Wt Readings from Last 1 Encounters:  02/28/14 180 lb 5.4 oz (81.8 kg)   02/25/14 163 lb 5.8 oz (74.1 kg)   Re-estimated needs (after extubation):  Kcal: 1600-1800 Protein: 80-95 gm Fluid: 1.6-1.8 L  Skin: intact  Diet Order:  NPO   Intake/Output Summary (Last 24 hours) at 02/28/14 1030 Last data filed at 02/28/14 1000  Gross per 24 hour  Intake 2411.67 ml  Output   1034 ml  Net 1377.67 ml    Last BM: 1/17   Labs:   Recent Labs Lab 02/27/14 0311 02/27/14 1822 02/28/14 0520  NA 137 135 134*  K 3.4* 3.7 3.3*  CL 109 104 103  CO2 18* 20 19  BUN 64* 68* 70*  CREATININE 4.35* 4.83* 4.91*  CALCIUM 8.4 8.1* 8.0*  GLUCOSE 168* 99 154*    CBG (last 3)   Recent Labs  02/27/14 2342 02/28/14 0437 02/28/14 0706  GLUCAP 178* 146* 147*    Scheduled Meds: . antiseptic  oral rinse  7 mL Mouth Rinse QID  . cefTRIAXone (ROCEPHIN)  IV  1 g Intravenous Q24H  . chlorhexidine  15 mL Mouth Rinse BID  . clotrimazole  1 Applicatorful Vaginal QHS  . famotidine  20 mg Per Tube Daily  . feeding supplement (PRO-STAT SUGAR FREE 64)  30 mL Per Tube BID  . feeding supplement (VITAL HIGH PROTEIN)  1,000 mL Per Tube Q24H  . insulin aspart  0-15 Units Subcutaneous 6 times per day  . insulin glargine  5 Units Subcutaneous QHS  . ipratropium-albuterol  3 mL Nebulization Q6H  . sodium chloride  10-40 mL Intracatheter Q12H    Continuous Infusions: . sodium chloride 10 mL/hr at 02/27/14 2200    Joaquin CourtsKimberly Harris, RD, LDN, CNSC Pager [phone] After Hours Pager [phone]

## 2014-02-28 NOTE — Consult Note (Addendum)
Requesting Physician:  CCM Reason for Consult:  AKI HPI: The patient is a 59 y.o. year-old AAF who apparently had little PMH who was evaluated at [facility] 1/17 for AMS, found to be in DKA with profound gap acidosis, septic from PNA with + S. Pneumo, required ventilator support, IV insulin, ATB's, bicarb drip.  CT brain normal. A1C 13's  Baseline kidney function not know. Initial creatinine on presentation 1.83, as low as 1.3-1.4 early in the admission, then steady rise to 4.83 by day of transfer and 4.91 today. Making urine. Had several studies done by Nephrology at [facility] including ANA, ANCA, complements, Hep B, Hep C al negative or normal. UA showed dipstick proteinuria, initially 21-50 RBC's but repeat urine on 3-6 RBC's. 24 hour urine protein 978. Tox screen neg for cocaine (+ for THC) mg. Ultrasound showed 12 and 13.2 cm kidneys right and left respectively without hydronephrosis. Pt was transferred here due to family's wish for higher level of care.  I see no profound hypotension, no ACE, ARB or NSAID, and no IV contrast. As noted baseline renal function not known.  She is presently extubated. Non-oliguric (918 ml out past 24 hours). MS not normal, but based on notes from prior providers appears improved. BP is stable.  Creatinine trending as follows: CREATININE, SER  Date/Time Value Ref Range Status  02/28/2014 05:20 AM 4.91* 0.50 - 1.10 mg/dL Final  16/11/9602 54:09 PM 4.83* 0.50 - 1.10 mg/dL Final  81/19/1478 29:56 AM 4.35* 0.50 - 1.10 mg/dL Final  21/30/8657 84:69 PM 3.77* 0.50 - 1.10 mg/dL Final  62/95/2841 32:44 AM 3.38* 0.50 - 1.10 mg/dL Final    Comment:    DELTA CHECK NOTED  02/25/2014 11:36 AM 2.25* 0.50 - 1.10 mg/dL Final  02/10/7251 66:44 AM 1.98* 0.50 - 1.10 mg/dL Final  03/47/4259 56:38 AM 1.73* 0.50 - 1.10 mg/dL Final  75/64/3329 51:88 PM 1.44* 0.50 - 1.10 mg/dL Final  41/66/0630 16:01 PM 1.38* 0.50 - 1.10 mg/dL Final  09/32/3557 32:20 AM 1.83* 0.50 - 1.10 mg/dL Final     Past Medical History: No past medical history on file.  Past Surgical History: No past surgical history on file.  Family History  Problem Relation Age of Onset  . Family history unknown: Yes   Social History:  has no tobacco, alcohol, and drug history on file. + drug screen for THC  Allergies: No Known Allergies  Home medications: Prior to Admission medications   Not on File    Inpatient medications: . antiseptic oral rinse  7 mL Mouth Rinse QID  . cefTRIAXone (ROCEPHIN)  IV  1 g Intravenous Q24H  . chlorhexidine  15 mL Mouth Rinse BID  . clotrimazole  1 Applicatorful Vaginal QHS  . famotidine  20 mg Per Tube Daily  . feeding supplement (PRO-STAT SUGAR FREE 64)  30 mL Per Tube BID  . feeding supplement (VITAL HIGH PROTEIN)  1,000 mL Per Tube Q24H  . insulin aspart  0-15 Units Subcutaneous 6 times per day  . insulin glargine  5 Units Subcutaneous QHS  . ipratropium-albuterol  3 mL Nebulization Q6H  . sodium chloride  10-40 mL Intracatheter Q12H   Infusions . sodium chloride 10 mL/hr at 02/27/14 2200  .  sodium bicarbonate  infusion 1000 mL 50 mL/hr at 02/28/14 1149   Review of Systems Essentially unobtainable as pt will respond only with nod to simple questions  Physical Exam:  Blood pressure 155/61, pulse 79, temperature 97 F (36.1 C), temperature source Oral, resp.  rate 16, height 5\' 7"  (1.702 m), weight 81.8 kg (180 lb 5.4 oz), SpO2 100 %.  Gen: Non-acutely ill appearing AAF VS as noted Nods yes and no to some questions but does not maintain eye contact or attempt to speak to me Skin: no rash, cyanosis Neck: no JVD, no bruits or LAN Chest: Anteriorly clear Heart: S1S2 No S3 No rub Abdomen: soft, No focal abdominal tenderness Ext: No LE edema. 1+ arm edema bilaterally Neuro: Awake but inattentive  Responds to yes no questions with nod   Recent Labs Lab 02/25/14 0743 02/25/14 1136 02/26/14 0545 02/26/14 1749 02/27/14 0311 02/27/14 1822 02/28/14 0520   NA 139 138 136 138 137 135 134*  K 3.6 3.8 3.5 3.3* 3.4* 3.7 3.3*  CL 113* 113* 111 112 109 104 103  CO2 18* 19 16* 17* 18* 20 19  GLUCOSE 157* 138* 349* 159* 168* 99 154*  BUN 40* 43* 55* 59* 64* 68* 70*  CREATININE 1.98* 2.25* 3.38* 3.77* 4.35* 4.83* 4.91*  CALCIUM 8.9 8.8 8.6 8.4 8.4 8.1* 8.0*    Recent Labs Lab 02/26/14 0545 02/28/14 0520  AST 21 16  ALT 19 14  ALKPHOS 151* 279*  BILITOT 0.6 0.6  PROT 5.6* 5.0*  ALBUMIN 1.7* 1.3*    Recent Labs Lab 02/24/14 1029  02/26/14 0545 02/27/14 0311 02/27/14 2100 02/28/14 0520  WBC 32.9*  < > 24.2* 17.9* 17.0* 14.2*  NEUTROABS 31.0*  --   --   --   --   --   HGB 11.8*  < > 8.8* 8.3* 9.0* 8.4*  HCT 36.7  < > 25.5* 23.7* 25.9* 24.2*  MCV 96.3  < > 91.7 91.2 89.3 89.3  PLT 323  < > 175 137* 142* 143*  < > = values in this interval not displayed.   Recent Labs Lab 02/24/14 1029 02/25/14 0442  CKTOTAL 102 32  TROPONINI <0.03  --    CBG:  Recent Labs Lab 02/27/14 2013 02/27/14 2342 02/28/14 0437 02/28/14 0706 02/28/14 1053  GLUCAP 131* 178* 146* 147* 158*    Iron Studies: No results for input(s): IRON, TIBC, TRANSFERRIN, FERRITIN in the last 168 hours.  Xrays/Other Studies: Dg Chest Port 1 View  02/28/2014   CLINICAL DATA:  Respiratory failure  EXAM: PORTABLE CHEST - 1 VIEW  COMPARISON:  02/26/2014  FINDINGS: There is an endotracheal tube which terminates between the clavicular heads and carina. A gastric suction tube continues below the diaphragm. Right upper extremity PICC, tip at the upper right atrium.  Given differences in technique, bilateral lung opacities with dense air bronchograms in the retrocardiac lung are unchanged. There is a right pleural effusion. No pulmonary edema or pneumothorax.  IMPRESSION: 1. Unchanged positioning of tubes and PICC. 2. Extensive bilateral pneumonia. No significant change since yesterday.   Electronically Signed   By: Tiburcio PeaJonathan  Watts M.D.   On: 02/28/2014 05:40   Dg Abd  Portable 1v  02/27/2014   CLINICAL DATA:  Initial evaluation for sepsis and pneumonia evaluate bowel gas pattern  EXAM: PORTABLE ABDOMEN - 1 VIEW  COMPARISON:  None.  FINDINGS: Orogastric tube projects over the left upper quadrant in the anticipated position in the stomach. Scattered air throughout small and large bowel. No abnormally dilated loops of bowel.  IMPRESSION: Nonobstructive gas pattern   Electronically Signed   By: Esperanza Heiraymond  Rubner M.D.   On: 02/27/2014 14:30    Background: 59 y.o. year-old AAF with little known PMH who [facility] for A[facility], found to be in DKA with profound gap acidosis, septic from PNA with + S. Pneumo, required ventilator support, IV insulin, ATB's, bicarb drip. Developed AKI (unknown baseline - 1.8 on admission to [facility]) in the setting of DKA/PNA/sepsis. Serologic w/u prior to transfer negative and ultrasound unremarkable.  Impression/Plan 1. AKI - Suspect underlying CKD d/t DM but baseline not known. Appears euvolemic. Probable ATN (sepsis/DKA). Rate of rise of creatinine slowing down. Does not need additional workup - just time... 2. Metabolic acidosis - gap has closed to 12. Venous CO2 still 19. On bicarb drip at 50. 3. DKA - per primary service. DM apparently not previously known 4. VDRF - now extubated 5. AMS - don't know baseline. Nods to simple questions. CT at [facility] negative. Monitor. Anticipate will improve with further correction of  metabolic and infectious issues 6. Sepsis/PNA - + S. Pneumo. ATB's. WBC falling (32K on adm) 7. Anemia - acute post adm. Monitoring 8. Thrombocytopenia - drop from 300's into 140's and stable there at this time   Camille Bal,  MD [facility] [phone] pager 02/28/2014, 12:44 PM

## 2014-02-28 NOTE — Progress Notes (Signed)
ANTIBIOTIC CONSULT NOTE - FOLLOW UP  Pharmacy Consult for Ceftriaxone Indication: Strep Pneumo PNA  No Known Allergies  Patient Measurements: Height:  (170.2 cm) Weight: 180 lb 5.4 oz (81.8 kg) IBW/kg (Calculated) : 61.6  Vital Signs: Temp: 98.3 F (36.8 C) (01/21 0734) Temp Source: Oral (01/21 0734) BP: 130/61 mmHg (01/21 0748) Pulse Rate: 75 (01/21 0748) Intake/Output from previous day: 01/20 0701 - 01/21 0700 In: 2781.1 [I.V.:1954.4; NG/GT:326.7; IV Piggyback:500] Out: 918 [Urine:918] Intake/Output from this shift: Total I/O In: 50 [I.V.:10; NG/GT:40] Out: 31 [Urine:31]  Labs:  Recent Labs  02/27/14 0311 02/27/14 1822 02/27/14 2100 02/28/14 0520  WBC 17.9*  --  17.0* 14.2*  HGB 8.3*  --  9.0* 8.4*  PLT 137*  --  142* 143*  CREATININE 4.35* 4.83*  --  4.91*   Estimated Creatinine Clearance: 13.7 mL/min (by C-G formula based on Cr of 4.91). No results for input(s): VANCOTROUGH, VANCOPEAK, VANCORANDOM, GENTTROUGH, GENTPEAK, GENTRANDOM, TOBRATROUGH, TOBRAPEAK, TOBRARND, AMIKACINPEAK, AMIKACINTROU, AMIKACIN in the last 72 hours.   Microbiology: Recent Results (from the past 720 hour(s))  MRSA PCR Screening     Status: None   Collection Time: 02/24/14  4:13 PM  Result Value Ref Range Status   MRSA by PCR NEGATIVE NEGATIVE Final    Comment:        The GeneXpert MRSA Assay (FDA approved for NASAL specimens only), is one component of a comprehensive MRSA colonization surveillance program. It is not intended to diagnose MRSA infection nor to guide or monitor treatment for MRSA infections.   Culture, blood (routine x 2) Call MD if unable to obtain prior to antibiotics being given     Status: None (Preliminary result)   Collection Time: 02/24/14  6:09 PM  Result Value Ref Range Status   Specimen Description BLOOD RIGHT HAND  Final   Special Requests BOTTLES DRAWN AEROBIC AND ANAEROBIC 6CC  Final   Culture NO GROWTH 3 DAYS  Final   Report Status PENDING   Incomplete  Culture, blood (routine x 2) Call MD if unable to obtain prior to antibiotics being given     Status: None (Preliminary result)   Collection Time: 02/24/14  6:25 PM  Result Value Ref Range Status   Specimen Description BLOOD PICC LINE DRAWN BY RN  Final   Special Requests BOTTLES DRAWN AEROBIC AND ANAEROBIC 6CC  Final   Culture NO GROWTH 3 DAYS  Final   Report Status PENDING  Incomplete  Culture, respiratory (NON-Expectorated)     Status: None   Collection Time: 02/24/14 11:35 PM  Result Value Ref Range Status   Specimen Description SPUTUM  Final   Special Requests NONE  Final   Gram Stain   Final    ABUNDANT WBC PRESENT, PREDOMINANTLY PMN NO SQUAMOUS EPITHELIAL CELLS SEEN ABUNDANT GRAM POSITIVE COCCI IN PAIRS Performed at [facility]    Culture   Final    FEW STREPTOCOCCUS PNEUMONIAE Performed at [facility]    Report Status 02/28/2014 FINAL  Final   Organism ID, Bacteria STREPTOCOCCUS PNEUMONIAE  Final      Susceptibility   Streptococcus pneumoniae - MIC (ETEST)*    CEFTRIAXONE 0.012 SENSITIVE Sensitive     LEVOFLOXACIN 0.50 SENSITIVE Sensitive     PENICILLIN 0.016 SENSITIVE Sensitive     * FEW STREPTOCOCCUS PNEUMONIAE  MRSA PCR Screening     Status: None   Collection Time: 02/27/14  6:27 PM  Result Value Ref Range Status   MRSA by PCR  NEGATIVE NEGATIVE Final    Comment:        The GeneXpert MRSA Assay (FDA approved for NASAL specimens only), is one component of a comprehensive MRSA colonization surveillance program. It is not intended to diagnose MRSA infection nor to guide or monitor treatment for MRSA infections.     Anti-infectives    Start     Dose/Rate Route Frequency Ordered Stop   02/27/14 1700  vancomycin (VANCOCIN) IVPB 1000 mg/200 mL premix  Status:  Discontinued     1,000 mg200 mL/hr over 60 Minutes Intravenous Every 48 hours 02/26/14 1254 02/28/14 0917   02/27/14 1400  piperacillin-tazobactam (ZOSYN) IVPB 2.25 g   Status:  Discontinued     2.25 g100 mL/hr over 30 Minutes Intravenous 3 times per day 02/27/14 0816 02/28/14 0917   02/27/14 1300  fluconazole (DIFLUCAN) IVPB 100 mg  Status:  Discontinued     100 mg50 mL/hr over 60 Minutes Intravenous Every 24 hours 02/27/14 1159 02/27/14 1915   02/24/14 1800  vancomycin (VANCOCIN) IVPB 1000 mg/200 mL premix  Status:  Discontinued     1,000 mg200 mL/hr over 60 Minutes Intravenous Every 24 hours 02/24/14 1602 02/26/14 1254   02/24/14 1700  piperacillin-tazobactam (ZOSYN) IVPB 3.375 g  Status:  Discontinued     3.375 g12.5 mL/hr over 240 Minutes Intravenous Every 8 hours 02/24/14 1602 02/27/14 0815   02/24/14 1215  cefTRIAXone (ROCEPHIN) 1 g in dextrose 5 % 50 mL IVPB     1 g100 mL/hr over 30 Minutes Intravenous  Once 02/24/14 1208 02/24/14 1416   02/24/14 1215  azithromycin (ZITHROMAX) 500 mg in dextrose 5 % 250 mL IVPB     500 mg250 mL/hr over 60 Minutes Intravenous  Once 02/24/14 1208 02/24/14 1318      Assessment: 58 yoF found unresponsive at home. On admission she was found to be in DKA, hypothermic, and CXR +PNA. She was intubated and started on empiric, broad-spectrum antibiotics for potential aspiration PNA/Sepsis.Resp cx came back with pan sensitive Strep pneumo.She is currently afebrile and WBC trending down. She remains intubated. Renal function is ppor with SCr up to 4.91, CrCl ~10-5415ml/min.  Goal of Therapy:  Resolution of infection  Plan:  Stop vancomycin and zosyn Start ceftriaxone 1g IV Q24 Monitor clinical picture F/U abx LOT (8 days?)  Jackie Fisher J 02/28/2014,9:22 AM

## 2014-02-28 NOTE — Progress Notes (Signed)
PULMONARY / CRITICAL CARE MEDICINE   Name: Jackie Fisher MRN: [id number] DOB: Nov 02, 1955    ADMISSION DATE:  02/24/2014 CONSULTATION DATE:  02/27/2014  REFERRING MD :  Forestine Na  CHIEF COMPLAINT:  DKA, persistent acidosis, b/l PNA.  INITIAL PRESENTATION:   Came in with DKA 1/17 to [facility]. Found to be in AKI, septic on admission, hypoxic resp failure, aspiration pna. Profoundly acidotic despite bicarb gtt. Was taken off insulin gtt and got hyperglycemic so was put back on it. Still having non gap acidosis despite being on bicarb drip. Sputum cx growing strep pneumo, final not back. Intubated and remains on vent. On vent 40%, peep 5, ps 15, o2 100%. On bicarb drip and insulin drip. Has AMS, CT head normal.   STUDIES:  1/17 - ct head - nl. Ct cspine no acute changes, c5/c6 mild-mod central canal narrowing.  1/17 - b/l airspaces opacities. 1/18 - renal u/s - no hydro.  1/19 - ab xray - no obstruction.  1/17, 1/18, 1/19 CXR - b/l extensive airspace opacities, likely pna. Cardiomegaly.  1/21 cxr: some improvement of b/l airspace disease.   SIGNIFICANT EVENTS: 1/17 - admitted. DKA, severe sepsis. Started on vanc+zosyn. and insulin gtt, DKA protocol. Intubated for AMS 1/18, 1/19. Gap closed but had hyperglycemia off insulin gtt so put back on it. Continued to have non gap acidosis. White count trending down. hgb dropped from 11.8 to 8-9. plt dropped from 323 --> 137. HIIT panel sent.   REVIEW OF SYSTEMS:  Denies chest pain. SUBJECTIVE: sedated, intubated.   VITAL SIGNS: Temp:  [97.3 F (36.3 C)-98.6 F (37 C)] 97.4 F (36.3 C) (01/20 2345) Pulse Rate:  [69-89] 69 (01/21 0600) Resp:  [12-26] 20 (01/21 0600) BP: (109-157)/(45-73) 132/65 mmHg (01/21 0600) SpO2:  [98 %-100 %] 100 % (01/21 0600) FiO2 (%):  [30 %-40 %] 30 % (01/21 0400) Weight:  [80.9 kg (178 lb 5.6 oz)-81.8 kg (180 lb 5.4 oz)] 81.8 kg (180 lb 5.4 oz) (01/21 0500) HEMODYNAMICS: CVP:  [11 mmHg-17 mmHg] 11  mmHg VENTILATOR SETTINGS: Vent Mode:  [-] PRVC FiO2 (%):  [30 %-40 %] 30 % Set Rate:  [15 bmp-20 bmp] 20 bmp Vt Set:  [450 mL-490 mL] 490 mL PEEP:  [5 cmH20] 5 cmH20 Plateau Pressure:  [14 cmH20-24 cmH20] 23 cmH20 INTAKE / OUTPUT:  Intake/Output Summary (Last 24 hours) at 02/28/14 3299 Last data filed at 02/28/14 0600  Gross per 24 hour  Intake 2836.07 ml  Output    918 ml  Net 1918.07 ml    PHYSICAL EXAMINATION: General:  Intubated, sedated, obese female. Neuro:  Opens her eyes upon verbal stimulus, follows some commands. HEENT:  [place], eomi. [place]/at.  Cardiovascular:  Rrr, no m/r/g Lungs:  ctab anteriorly. Abdomen:  Soft, nt, nd. Obese. Musculoskeletal:  No edema, no injuries visible.  Skin:  No lesions.  LABS:  CBC  Recent Labs Lab 02/27/14 0311 02/27/14 2100 02/28/14 0520  WBC 17.9* 17.0* 14.2*  HGB 8.3* 9.0* 8.4*  HCT 23.7* 25.9* 24.2*  PLT 137* 142* 143*   Coag's No results for input(s): APTT, INR in the last 168 hours. BMET  Recent Labs Lab 02/27/14 0311 02/27/14 1822 02/28/14 0520  NA 137 135 134*  K 3.4* 3.7 3.3*  CL 109 104 103  CO2 18* 20 19  BUN 64* 68* 70*  CREATININE 4.35* 4.83* 4.91*  GLUCOSE 168* 99 154*   Electrolytes  Recent Labs Lab 02/27/14 0311 02/27/14 1822 02/28/14 0520  CALCIUM  8.4 8.1* 8.0*   Sepsis Markers  Recent Labs Lab 02/24/14 1825 02/27/14 1159  LATICACIDVEN 1.8 0.8  PROCALCITON  --  24.81   ABG  Recent Labs Lab 02/27/14 0358 02/27/14 2149 02/28/14 0250  PHART 7.240* 7.356 7.358  PCO2ART 41.5 27.8* 30.4*  PO2ART 167.0* 159.0* 186.0*   Liver Enzymes  Recent Labs Lab 02/26/14 0545 02/28/14 0520  AST 21 16  ALT 19 14  ALKPHOS 151* 279*  BILITOT 0.6 0.6  ALBUMIN 1.7* 1.3*   Cardiac Enzymes  Recent Labs Lab 02/24/14 1029  TROPONINI <0.03   Glucose  Recent Labs Lab 02/27/14 1610 02/27/14 1741 02/27/14 1902 02/27/14 2013 02/27/14 2342 02/28/14 0437  GLUCAP 130* 124* 86 131* 178*  146*    Imaging Dg Abd Portable 1v  02/27/2014   CLINICAL DATA:  Initial evaluation for sepsis and pneumonia evaluate bowel gas pattern  EXAM: PORTABLE ABDOMEN - 1 VIEW  COMPARISON:  None.  FINDINGS: Orogastric tube projects over the left upper quadrant in the anticipated position in the stomach. Scattered air throughout small and large bowel. No abnormally dilated loops of bowel.  IMPRESSION: Nonobstructive gas pattern   Electronically Signed   By: Skipper Cliche M.D.   On: 02/27/2014 14:30     ASSESSMENT / PLAN:  PULMONARY OETT 7/12 >> A: Metabolic acidosis with uncompensation. B/l pneumonia, sputum cx growing GPC in pairs.   P:   PH improved after Increasing resp rate to 20 for better pulm compensation of her acidosis. - repeat abg. Reduce sedation to increase her own Resp rate.  Cont vent. Cont abx for pna Keep on min O2 as able to sats goal 92% Overall some improvement in infiltrates Extubate today. Repeat PCXR tomorrow.  CARDIOVASCULAR CVL PICC right basilic 4/58 >> A:  Normotensive cvp qshift. P:  Maintain normotension.  Dc line when able  RENAL A:   Initially gap met acidosis 2/2 to DKA gap 29 bicarb 7.  Now nongap acidosis small Lactic acid normal. Ketones in UA initially AKI on CKD - on admit crt was 1.83, now 4.35c likely 2/2 to ATN Fena 3.7% (intrarenal) P:   Nephrology was consulted previously at [facility]. Thought AKI due to oliguric ATN with proteinuria. Various studies were done to find cause of AKI including hep B, ANCA, and complements. All normal. Will consult nephro here in AM likely Check UA and cytology for easinophils. Concerned for vanc induced AKI. D/cing Lucianne Lei since we don't need it anyway. Cont bicarb drip for nongap acidosis.  Monitor UOP.  Avoid nephrotoxins.  Avoid saline   GASTROINTESTINAL A:   On tube feeds. Stop tube feed for exubation. Start diet if tolerates later.  HEMATOLOGIC A:   Anemia acute - on admit 11.8, now 8-9.  Likely due to acute illness. No apparent blood loss. Leukocytosis - improving Thrombocytopenia - initially plt 323. 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  P:  HIT panel sent. Held heparin. SCD for dvt ppx.   INFECTIOUS A:   Strep pneum + pneumonia, b/l. Initial wbc 32.9, trending down.  P:   BCx2 1/17 >> Sputum 1/17 >> strep pneum. vanc 1/17 >> 1/21 Zosyn 1/17 >> 1/21 Ceftriaxone 1/21 >> 1/26 No pct on admission. In 20s 1/20. Treat for strep pneum pneumonia for 10 days with ceftriaxone.   ENDOCRINE A:   ?Hx diabetes. Came in with DKA likely induced by pneumonia hgba1c in 13 P:   Transitioned to lantus + ssi. Diabetic coordinator consult Needs to be on  insulin going forward.  NEUROLOGIC A:  AMS likely 2/2 to metabolic and infectious etiology CT head normal P:   RASS goal: 0 to -1. D/c fentanyl drip. This will likely increase resp rate and allow [place] to extubate today PRN fentanyl   FAMILY  - Updates:  1/20. Per sisters, patient wants to be DNR but they are not sure who has POA. Will keep full code for now since she was healthy before this happened. Will wait for them to find records if someone has medical POA.   - Inter-disciplinary family meet or Palliative Care meeting due by:  03/06/2014    TODAY'S SUMMARY:  Transferred from [facility], initially admitted with b/l pneumonia, DKA, severe gap metabolic acidosis and AMS. Now acidosis resolved today. Monitor BMET and ABG. Extubated. Check urine studies for AKI. Switched to ceftriaxone since strep pneum+.    Dellia Nims, MD, PGY-1.  02/28/2014, 6:29 AM   ATTENDING NOTE: I have personally reviewed patient's available data, including medical history, events of note, physical examination and test results as part of my evaluation. I have discussed with resident/NP and other careteam providers such as pharmacist, RN and RRT & co-ordinated with consultants. In addition, I personally evaluated patient and elicited  key history of altered mental status, found down, mechanical ventilation for bilateral pneumonia, transferred from [facility], exam findings of decreased breath sounds bilateral, no edema & labs showing initial drop in creatinine followed by AKI, initial lactic acidosis resolved-then non-gap acidosis.  Serologic workup and ultrasound unremarkable  Tolerated pressure support with good volumes, metabolic acidosis on ABG, extubated to nasal cannula, mental status improving. High glycosylated hemoglobin- represents uncontrolled diabetes Rest per medical resident whose note is outlined above and that I agree with and edited in full.   Care during the described time interval was provided by me and/or other providers on the critical care team.  I have reviewed this patient's available data, including medical history, events of note, physical examination and test results as part of my evaluation  CC time x 30m ARigoberto Noel MD

## 2014-03-01 LAB — BASIC METABOLIC PANEL
Anion gap: 15 (ref 5–15)
BUN: 90 mg/dL — AB (ref 6–23)
CHLORIDE: 105 meq/L (ref 96–112)
CO2: 18 mmol/L — AB (ref 19–32)
Calcium: 8.5 mg/dL (ref 8.4–10.5)
Creatinine, Ser: 5.69 mg/dL — ABNORMAL HIGH (ref 0.50–1.10)
GFR calc Af Amer: 9 mL/min — ABNORMAL LOW (ref >90)
GFR calc non Af Amer: 7 mL/min — ABNORMAL LOW (ref >90)
GLUCOSE: 263 mg/dL — AB (ref 70–99)
Potassium: 3.6 mmol/L (ref 3.5–5.1)
Sodium: 138 mmol/L (ref 135–145)

## 2014-03-01 LAB — GLUCOSE, CAPILLARY
GLUCOSE-CAPILLARY: 180 mg/dL — AB (ref 70–99)
GLUCOSE-CAPILLARY: 186 mg/dL — AB (ref 70–99)
GLUCOSE-CAPILLARY: 191 mg/dL — AB (ref 70–99)
GLUCOSE-CAPILLARY: 205 mg/dL — AB (ref 70–99)
Glucose-Capillary: 216 mg/dL — ABNORMAL HIGH (ref 70–99)
Glucose-Capillary: 251 mg/dL — ABNORMAL HIGH (ref 70–99)

## 2014-03-01 LAB — CBC
HEMATOCRIT: 26.8 % — AB (ref 36.0–46.0)
HEMOGLOBIN: 9.4 g/dL — AB (ref 12.0–15.0)
MCH: 30.9 pg (ref 26.0–34.0)
MCHC: 35.1 g/dL (ref 30.0–36.0)
MCV: 88.2 fL (ref 78.0–100.0)
Platelets: 218 10*3/uL (ref 150–400)
RBC: 3.04 MIL/uL — AB (ref 3.87–5.11)
RDW: 14.4 % (ref 11.5–15.5)
WBC: 12.8 10*3/uL — ABNORMAL HIGH (ref 4.0–10.5)

## 2014-03-01 LAB — CULTURE, BLOOD (ROUTINE X 2)
CULTURE: NO GROWTH
CULTURE: NO GROWTH

## 2014-03-01 LAB — HEPARIN INDUCED THROMBOCYTOPENIA PNL
HEPARIN INDUCED PLT AB: NEGATIVE
Patient O.D.: 0.104
UFH HIGH DOSE UFH H: 0 %
UFH LOW DOSE 0.1 IU/ML: 0 %
UFH Low Dose 0.5 IU/mL: 0 % Release
UFH SRA RESULT: NEGATIVE

## 2014-03-01 LAB — BLOOD GAS, ARTERIAL
ACID-BASE DEFICIT: 5.8 mmol/L — AB (ref 0.0–2.0)
BICARBONATE: 18.2 meq/L — AB (ref 20.0–24.0)
Drawn by: 31101
FIO2: 0.21 %
O2 SAT: 97.3 %
PCO2 ART: 31.2 mmHg — AB (ref 35.0–45.0)
Patient temperature: 98.9
TCO2: 19.2 mmol/L (ref 0–100)
pH, Arterial: 7.386 (ref 7.350–7.450)
pO2, Arterial: 85 mmHg (ref 80.0–100.0)

## 2014-03-01 LAB — RENAL FUNCTION PANEL
Albumin: 1.5 g/dL — ABNORMAL LOW (ref 3.5–5.2)
Anion gap: 17 — ABNORMAL HIGH (ref 5–15)
BUN: 89 mg/dL — ABNORMAL HIGH (ref 6–23)
CO2: 19 mmol/L (ref 19–32)
Calcium: 8.4 mg/dL (ref 8.4–10.5)
Chloride: 101 mEq/L (ref 96–112)
Creatinine, Ser: 5.68 mg/dL — ABNORMAL HIGH (ref 0.50–1.10)
GFR calc Af Amer: 9 mL/min — ABNORMAL LOW (ref >90)
GFR calc non Af Amer: 7 mL/min — ABNORMAL LOW (ref >90)
Glucose, Bld: 209 mg/dL — ABNORMAL HIGH (ref 70–99)
POTASSIUM: 3.5 mmol/L (ref 3.5–5.1)
Phosphorus: 3.9 mg/dL (ref 2.3–4.6)
Sodium: 137 mmol/L (ref 135–145)

## 2014-03-01 LAB — HAPTOGLOBIN: Haptoglobin: 402 mg/dL — ABNORMAL HIGH (ref 34–200)

## 2014-03-01 MED ORDER — AMLODIPINE BESYLATE 5 MG PO TABS
5.0000 mg | ORAL_TABLET | Freq: Every day | ORAL | Status: DC
  Administered 2014-03-01 – 2014-03-02 (×2): 5 mg via ORAL
  Filled 2014-03-01 (×2): qty 1

## 2014-03-01 MED ORDER — INSULIN ASPART 100 UNIT/ML ~~LOC~~ SOLN
0.0000 [IU] | Freq: Three times a day (TID) | SUBCUTANEOUS | Status: DC
  Administered 2014-03-02: 11 [IU] via SUBCUTANEOUS
  Administered 2014-03-02: 3 [IU] via SUBCUTANEOUS
  Administered 2014-03-02: 8 [IU] via SUBCUTANEOUS
  Administered 2014-03-02: 11 [IU] via SUBCUTANEOUS
  Administered 2014-03-03: 2 [IU] via SUBCUTANEOUS
  Administered 2014-03-03 (×3): 3 [IU] via SUBCUTANEOUS
  Administered 2014-03-04 – 2014-03-05 (×2): 2 [IU] via SUBCUTANEOUS
  Administered 2014-03-05: 3 [IU] via SUBCUTANEOUS
  Administered 2014-03-06: 8 [IU] via SUBCUTANEOUS
  Administered 2014-03-06: 2 [IU] via SUBCUTANEOUS
  Administered 2014-03-06: 5 [IU] via SUBCUTANEOUS
  Administered 2014-03-07: 2 [IU] via SUBCUTANEOUS
  Administered 2014-03-07: 5 [IU] via SUBCUTANEOUS
  Administered 2014-03-07: 2 [IU] via SUBCUTANEOUS
  Administered 2014-03-08 (×2): 3 [IU] via SUBCUTANEOUS
  Administered 2014-03-08 (×2): 8 [IU] via SUBCUTANEOUS
  Administered 2014-03-09 (×3): 3 [IU] via SUBCUTANEOUS
  Administered 2014-03-10: 8 [IU] via SUBCUTANEOUS
  Administered 2014-03-10: 3 [IU] via SUBCUTANEOUS
  Administered 2014-03-11: 2 [IU] via SUBCUTANEOUS
  Administered 2014-03-11: 5 [IU] via SUBCUTANEOUS
  Administered 2014-03-11: 2 [IU] via SUBCUTANEOUS
  Administered 2014-03-11: 3 [IU] via SUBCUTANEOUS
  Administered 2014-03-12: 5 [IU] via SUBCUTANEOUS
  Administered 2014-03-12: 3 [IU] via SUBCUTANEOUS
  Administered 2014-03-12: 5 [IU] via SUBCUTANEOUS
  Administered 2014-03-13: 3 [IU] via SUBCUTANEOUS
  Administered 2014-03-13: 5 [IU] via SUBCUTANEOUS

## 2014-03-01 MED ORDER — INSULIN GLARGINE 100 UNIT/ML ~~LOC~~ SOLN
10.0000 [IU] | Freq: Every day | SUBCUTANEOUS | Status: DC
  Administered 2014-03-01: 10 [IU] via SUBCUTANEOUS
  Filled 2014-03-01 (×2): qty 0.1

## 2014-03-01 NOTE — Progress Notes (Signed)
PULMONARY / CRITICAL CARE MEDICINE   Name: Jackie Fisher MRN: [id number] DOB: 04/05/1955    ADMISSION DATE:  02/24/2014 CONSULTATION DATE:  02/27/2014  REFERRING MD :  Forestine Na  CHIEF COMPLAINT:  DKA, persistent acidosis, b/l PNA.  INITIAL PRESENTATION:   Came in with DKA 1/17 to [facility]. Found to be in AKI, septic on admission, hypoxic resp failure, aspiration pna. Profoundly acidotic despite bicarb gtt. Was taken off insulin gtt and got hyperglycemic so was put back on it. Still having non gap acidosis despite being on bicarb drip. Sputum cx growing strep pneumo, final not back. Intubated and remains on vent. On vent 40%, peep 5, ps 15, o2 100%. On bicarb drip and insulin drip. Has AMS, CT head normal.   STUDIES:  1/17 - ct head - nl. Ct cspine no acute changes, c5/c6 mild-mod central canal narrowing.  1/17 - b/l airspaces opacities. 1/18 - renal u/s - no hydro.  1/19 - ab xray - no obstruction.  1/17, 1/18, 1/19 CXR - b/l extensive airspace opacities, likely pna. Cardiomegaly.  1/21 cxr: some improvement of b/l airspace disease.   SIGNIFICANT EVENTS: 1/17 - admitted. DKA, severe sepsis. Started on vanc+zosyn. and insulin gtt, DKA protocol. Intubated for AMS 1/18, 1/19. Gap closed but had hyperglycemia off insulin gtt so put back on it. Continued to have non gap acidosis. White count trending down. hgb dropped from 11.8 to 8-9. plt dropped from 323 --> 137. HIIT panel sent.  1/21 Extubated.    SUBJECTIVE:  Responds to question but has a flat affect.  BP elevated this morning   VITAL SIGNS: Temp:  [97 F (36.1 C)-98.9 F (37.2 C)] 98.6 F (37 C) (01/22 0750) Pulse Rate:  [77-83] 82 (01/22 0900) Resp:  [12-30] 24 (01/22 0900) BP: (102-175)/(55-74) 175/65 mmHg (01/22 0900) SpO2:  [96 %-100 %] 96 % (01/22 0900) FiO2 (%):  [30 %] 30 % (01/21 1005) Weight:  [179 lb 7.3 oz (81.4 kg)] 179 lb 7.3 oz (81.4 kg) (01/22 0500) HEMODYNAMICS: CVP:  [11 mmHg] 11 mmHg VENTILATOR  SETTINGS: Vent Mode:  [-] PSV;CPAP FiO2 (%):  [30 %] 30 % PEEP:  [5 cmH20] 5 cmH20 Pressure Support:  [5 cmH20] 5 cmH20 INTAKE / OUTPUT:  Intake/Output Summary (Last 24 hours) at 03/01/14 0947 Last data filed at 03/01/14 0936  Gross per 24 hour  Intake 1371.67 ml  Output   1390 ml  Net -18.33 ml    PHYSICAL EXAMINATION: General: obese female, no acute distress.  Neuro:  Fully alert and oriented. Flat affect. No focal neurologic deficits. HEENT: MMM, airway is open.  Cardiovascular:  Rrr, no m/r/g Lungs:  On room air. Breathing comfortably. ctab anteriorly. Abdomen:  Soft, nt, nd. Obese. Musculoskeletal:  No edema, no injuries visible.  Skin:  No lesions.  LABS:  CBC  Recent Labs Lab 02/27/14 2100 02/28/14 0520 03/01/14 0712  WBC 17.0* 14.2* 12.8*  HGB 9.0* 8.4* 9.4*  HCT 25.9* 24.2* 26.8*  PLT 142* 143* 218   Coag's No results for input(s): APTT, INR in the last 168 hours. BMET  Recent Labs Lab 02/28/14 0017 02/28/14 0520 03/01/14 0712  NA 138 134* 137  K 3.6 3.3* 3.5  CL 105 103 101  CO2 18* 19 19  BUN 90* 70* 89*  CREATININE 5.69* 4.91* 5.68*  GLUCOSE 263* 154* 209*   Electrolytes  Recent Labs Lab 02/28/14 0017 02/28/14 0520 03/01/14 0712  CALCIUM 8.5 8.0* 8.4  PHOS  --   --  3.9   Sepsis Markers  Recent Labs Lab 02/24/14 1825 02/27/14 1159  LATICACIDVEN 1.8 0.8  PROCALCITON  --  24.81   ABG  Recent Labs Lab 02/28/14 0250 02/28/14 1103 03/01/14 0257  PHART 7.358 7.258* 7.386  PCO2ART 30.4* 36.5 31.2*  PO2ART 186.0* 54.0* 85.0   Liver Enzymes  Recent Labs Lab 02/26/14 0545 02/28/14 0520 03/01/14 0712  AST 21 16  --   ALT 19 14  --   ALKPHOS 151* 279*  --   BILITOT 0.6 0.6  --   ALBUMIN 1.7* 1.3* 1.5*   Cardiac Enzymes  Recent Labs Lab 02/24/14 1029  TROPONINI <0.03   Glucose  Recent Labs Lab 02/28/14 1053 02/28/14 1512 02/28/14 1904 03/01/14 0002 03/01/14 0437 03/01/14 0747  GLUCAP 158* 230* 247* 251*  180* 186*    Imaging Dg Chest Port 1 View  02/28/2014   CLINICAL DATA:  Respiratory failure  EXAM: PORTABLE CHEST - 1 VIEW  COMPARISON:  02/26/2014  FINDINGS: There is an endotracheal tube which terminates between the clavicular heads and carina. A gastric suction tube continues below the diaphragm. Right upper extremity PICC, tip at the upper right atrium.  Given differences in technique, bilateral lung opacities with dense air bronchograms in the retrocardiac lung are unchanged. There is a right pleural effusion. No pulmonary edema or pneumothorax.  IMPRESSION: 1. Unchanged positioning of tubes and PICC. 2. Extensive bilateral pneumonia. No significant change since yesterday.   Electronically Signed   By: Jorje Guild M.D.   On: 02/28/2014 05:40     ASSESSMENT / PLAN:  PULMONARY OETT 1/17 >>0/27 A: Metabolic acidosis with uncompensation. B/l pneumonia, sputum cx growing GPC in pairs. P:   Extubated 1/21, breathing normally on room air  Cont abx for pna to complete 10 days total therapy Keep on min O2 as able to sats goal 92%  CARDIOVASCULAR CVL PICC right basilic 2/53 >> HTN  A:  Normotensive cvp qshift. P:  Maintain normotension Start amlodipine 5 mg.  Dc PICC today   RENAL A:   Initially gap met acidosis 2/2 to DKA gap 29 bicarb 7.  Now nongap acidosis small Lactic acid normal. Ketones in UA initially AKI on CKD - on admit crt was 1.83 > 5.68 Fena 3.7% (intrarenal) Urinalysis concerning of ATN P:   Nephrology consulted  Urine cytology for eosinophils has not come back  Off Vanc 1/21 Monitor UOP.  Avoid nephrotoxins.  Avoid saline  GASTROINTESTINAL A:   Advance diet as tolerated  HEMATOLOGIC A:   Anemia acute - hgb 11.8 >>9.4  Leukocytosis - improving Thrombocytopenia -improving  HIP negative  P:  HIT panel sent. Held heparin. SCD for dvt ppx.   INFECTIOUS A:   Strep pneum + pneumonia, b/l. Initial wbc 32.9 >>12.8  P:   BCx2 1/17  >>NGTD Sputum 1/17 >> strep pneum. vanc 1/17 >> 1/21 Zosyn 1/17 >> 1/21 Ceftriaxone 1/21 >> 1/26.  ENDOCRINE A:   ?Hx diabetes. Came in with DKA likely induced by pneumonia hgba1c in 13 P:   Transitioned to increase lantus + ssi. Increased lantus to 10 units Will need case management consult for diabetic medications due to lack of insurance  NEUROLOGIC A:  AMS likely 2/2 to metabolic and infectious etiology CT head normal Likely Depression Per family no mental   P:    Off sedation Extubated 1/22    Patient will be transferred to SDU. [facility] aware to take over tomorrow (1/23).   FAMILY  Updated patient's  brother Laisa Larrick) 1/22. He states that patient does not have mental illness. From earlier documentation, per sisters, patient wants to be DNR but they are not sure who has POA. Will keep full code for now since she was healthy before this happened. Code status will need to be clarified once family comes around.  - Inter-disciplinary family meet or Palliative Care meeting due by:  03/06/2014    Case discussed with Dr Elsworth Soho.    Signed:  Jessee Avers, MD PGY-3 Internal Medicine Teaching Service Pager: [phone]  ATTENDING NOTE: I have personally reviewed patient's available data, including medical history, events of note, physical examination and test results as part of my evaluation. I have discussed with resident/NP and other careteam providers such as pharmacist, RN and RRT & co-ordinated with consultants. In addition, I personally evaluated patient and elicited key history of acute renal failure, metabolic acidosis, mechanical ventilation, exam findings of has done well postextubation, good urine output ,flat affect & labs showing worsening creatinine, acidosis improved-on bicarbonate drip.   Recommend-increase Lantus, will ask social work to help her with diabetes medications and discharge Continue bicarbonate drip, expect creatinine to plateau soon since  nonoliguric-renal following Can transfer to floor and to [facility] 1/23 Rest per medical resident whose note is outlined above and that I agree with and edited in full.   Care during the described time interval was provided by me and/or other providers on the critical care team.  I have reviewed this patient's available data, including medical history, events of note, physical examination and test results as part of my evaluation  CC time x 60m Vadie Principato V. MD  03/01/2014, 9:47 AM

## 2014-03-01 NOTE — Progress Notes (Signed)
[facility] Subjective:  Even more awake Answering a few questions Denies pain or SOB Can't really tell from her responses to me if oriented but clearly more alert Still on bicarb drip at 50 = only fluid UOP about a liter yesterday  Objective Vital signs in last 24 hours: Filed Vitals:   03/01/14 0600 03/01/14 0700 03/01/14 0750 03/01/14 0751  BP: 160/66 153/65    Pulse: 78 78    Temp:   98.6 F (37 C)   TempSrc:   Oral   Resp: 19 18    Height:      Weight:      SpO2: 98% 97%  100%   Weight change: 0.5 kg (1 lb 1.6 oz)  Intake/Output Summary (Last 24 hours) at 03/01/14 0817 Last data filed at 03/01/14 0700  Gross per 24 hour  Intake   1255 ml  Output   1040 ml  Net    215 ml   Physical Exam:  Blood pressure 153/65, pulse 78, temperature 98.6 F (37 C), temperature source Oral, resp. rate 18, height 5\' 7"  (1.702 m), weight 81.4 kg (179 lb 7.3 oz), SpO2 100 %. Gen: Non-acutely ill appearing AAF NAD VS as noted Answering a few simple questions Skin: no rash, cyanosis Neck: no JVD Chest: Anteriorly clear Heart: S1S2 No S3 No rub Abdomen: soft, No focal abdominal tenderness Ext: No LE edema. 1+ arm edema bilaterally unchanged Clear urin in foley   Labs: Basic Metabolic Panel:  Recent Labs Lab 02/26/14 0545 02/26/14 1749 02/27/14 0311 02/27/14 1822 02/28/14 0017 02/28/14 0520 03/01/14 0712  NA 136 138 137 135 138 134* 137  K 3.5 3.3* 3.4* 3.7 3.6 3.3* 3.5  CL 111 112 109 104 105 103 101  CO2 16* 17* 18* 20 18* 19 19  GLUCOSE 349* 159* 168* 99 263* 154* 209*  BUN 55* 59* 64* 68* 90* 70* 89*  CREATININE 3.38* 3.77* 4.35* 4.83* 5.69* 4.91* 5.68*  CALCIUM 8.6 8.4 8.4 8.1* 8.5 8.0* 8.4  PHOS  --   --   --   --   --   --  3.9     Recent Labs Lab 02/26/14 0545 02/28/14 0520 03/01/14 0712  AST 21 16  --   ALT 19 14  --   ALKPHOS 151* 279*  --   BILITOT 0.6 0.6  --   PROT 5.6* 5.0*  --   ALBUMIN 1.7* 1.3* 1.5*   Recent Labs Lab  02/24/14 1029  02/27/14 0311 02/27/14 2100 02/28/14 0520 03/01/14 0712  WBC 32.9*  < > 17.9* 17.0* 14.2* 12.8*  NEUTROABS 31.0*  --   --   --   --   --   HGB 11.8*  < > 8.3* 9.0* 8.4* 9.4*  HCT 36.7  < > 23.7* 25.9* 24.2* 26.8*  MCV 96.3  < > 91.2 89.3 89.3 88.2  PLT 323  < > 137* 142* 143* 218  < > = values in this interval not displayed.  Recent Labs Lab 02/24/14 1029 02/25/14 0442  CKTOTAL 102 32  TROPONINI <0.03  --     Recent Labs Lab 02/28/14 1053 02/28/14 1512 02/28/14 1904 03/01/14 0002 03/01/14 0437  GLUCAP 158* 230* 247* 251* 180*   Studies/Results: Dg Chest Port 1 View  02/28/2014   CLINICAL DATA:  Respiratory failure  EXAM: PORTABLE CHEST - 1 VIEW  COMPARISON:  02/26/2014  FINDINGS: There is an endotracheal tube which terminates between the clavicular heads and carina. A gastric  suction tube continues below the diaphragm. Right upper extremity PICC, tip at the upper right atrium.  Given differences in technique, bilateral lung opacities with dense air bronchograms in the retrocardiac lung are unchanged. There is a right pleural effusion. No pulmonary edema or pneumothorax.  IMPRESSION: 1. Unchanged positioning of tubes and PICC. 2. Extensive bilateral pneumonia. No significant change since yesterday.   Electronically Signed   By: Tiburcio Pea M.D.   On: 02/28/2014 05:40   Dg Abd Portable 1v  02/27/2014   CLINICAL DATA:  Initial evaluation for sepsis and pneumonia evaluate bowel gas pattern  EXAM: PORTABLE ABDOMEN - 1 VIEW  COMPARISON:  None.  FINDINGS: Orogastric tube projects over the left upper quadrant in the anticipated position in the stomach. Scattered air throughout small and large bowel. No abnormally dilated loops of bowel.  IMPRESSION: Nonobstructive gas pattern   Electronically Signed   By: Esperanza Heir M.D.   On: 02/27/2014 14:30   Medications: . sodium chloride 10 mL/hr (03/01/14 0650)  .  sodium bicarbonate  infusion 1000 mL 50 mL/hr at 03/01/14  0644   . antiseptic oral rinse  7 mL Mouth Rinse QID  . cefTRIAXone (ROCEPHIN)  IV  1 g Intravenous Q24H  . chlorhexidine  15 mL Mouth Rinse BID  . clotrimazole  1 Applicatorful Vaginal QHS  . famotidine  20 mg Per Tube Daily  . insulin aspart  0-15 Units Subcutaneous 6 times per day  . insulin glargine  5 Units Subcutaneous QHS  . ipratropium-albuterol  3 mL Nebulization Q6H  . sodium chloride  10-40 mL Intracatheter Q12H    I  have reviewed scheduled and prn medications.  Background: 59 y.o. year-old AAF with little known PMH who was evaluated at [facility] 1/17 for AMS, found to be in DKA with profound gap acidosis, septic from PNA with + S. Pneumo, required ventilator support, IV insulin, ATB's, bicarb drip. Developed AKI (unknown baseline - 1.8 on admission to [facility] and as low as 1.38 earlier in hosp) in the setting of DKA/PNA/sepsis. Serologic w/u prior to transfer negative and ultrasound unremarkable.   Impression/Plan  1. AKI - Suspect underlying CKD d/t DM but baseline not known. Appears euvolemic. Probable ATN (sepsis/DKA). Yesterday it appeared that rate of rise of creatinine was slowing down but last 4 creatinines have bounced around from 4.9 to 5.68. UOP appears to be pcking up.  Does not need additional workup - just time.Marland KitchenMarland KitchenHope she will plateau soon.  Would increase fluids just a little to 75/hour. No dialysis indications 2. Metabolic acidosis - gap has increased some over 25 hours. Venous CO2 still 19. Continue bicarb rate to 75 3. DKA - per primary service. DM apparently not previously known 4. VDRF - now extubated 5. AMS - don't know baseline. CT at [facility] negative. Monitor. Anticipate will improve with further correction of metabolic and infectious issues. Clearly more alert today. 6. Sepsis/PNA - + S. Pneumo. Rocephin. WBC continues to improve (32K on adm) 7. Anemia - acute post adm. Monitoring 8. Thrombocytopenia - resolved. Plts back to normal   Camille Bal, MD [facility] [phone] pager 03/01/2014, 8:17 AM

## 2014-03-01 NOTE — Progress Notes (Signed)
Physical Therapy Evaluation Patient Details Name: Jackie Fisher MRN: [id number] DOB: May 18, 1955 Today's Date: 03/01/2014   History of Present Illness  Patient is a 59 yo female admitted 02/24/14 to [facility] unresponsive.  Patient with DKA, hypoxic resp failure, asp pna bil., septic, AKI, acute encephalopathy and was intubated.  Patient transferred to [facility] on 02/27/14.  Patient extubated 02/28/14.  Clinical Impression  Patient presents with problems listed below.  Will benefit from acute PT to maximize functional mobility prior to discharge home.  Feel patient should progress with PT to allow d/c home with 24 hour assist and HHPT follow up.  Patient able to answer questions with short responses.  Will need to verify brother's ability to provide 24 hour assist.    Follow Up Recommendations Home health PT;Supervision/Assistance - 24 hour    Equipment Recommendations  Rolling walker with 5" wheels (continue to assess)    Recommendations for Other Services       Precautions / Restrictions Precautions Precautions: Fall Restrictions Weight Bearing Restrictions: No      Mobility  Bed Mobility Overal bed mobility: Needs Assistance;+2 for physical assistance Bed Mobility: Supine to Sit;Sit to Supine     Supine to sit: Mod assist;+2 for physical assistance Sit to supine: Max assist;+2 for physical assistance   General bed mobility comments: Verbal cues for technique.  Slow to initiate movement.  Assist of 2 to bring LE's off of bed and to raise trunk to sitting position.  Once upright, patient able to maintain sitting balance with min guard assist.  Patient with flexed posture and forward head.  Cues to sit upright.  Patient sat EOB x 8 minutes.  HR increased to 84 from 79, and O2 sats at 97%.  Patient fatigued quickly.  Transfers                    Ambulation/Gait                Stairs            Wheelchair Mobility    Modified Rankin (Stroke Patients Only)        Balance Overall balance assessment: Needs assistance Sitting-balance support: Single extremity supported;Feet supported Sitting balance-Leahy Scale: Fair Sitting balance - Comments: Able to maintain static balance with min guard assist with flexed posture.                                     Pertinent Vitals/Pain Pain Assessment: No/denies pain    Home Living Family/patient expects to be discharged to:: Private residence Living Arrangements: Alone Available Help at Discharge: Family;Available 24 hours/day (Brother) Type of Home: House Home Access: Level entry     Home Layout: One level Home Equipment: None Additional Comments: Patient reports her brother could stay with her at discharge - needs to be verified.    Prior Function Level of Independence: Independent         Comments: Information from patient - will need to veryify.     Hand Dominance        Extremity/Trunk Assessment   Upper Extremity Assessment: Generalized weakness (Grossly 3-/5.  Note edema BUE's/hands)           Lower Extremity Assessment: Generalized weakness (Grossly 3/5; Noted edema BLE's/feet)      Cervical / Trunk Assessment: Normal  Communication   Communication: Expressive difficulties (One-word answers. Rough voice (reports sore throat))  Cognition Arousal/Alertness: Lethargic (More alert per chart) Behavior During Therapy: Flat affect Overall Cognitive Status: Impaired/Different from baseline Area of Impairment: Orientation;Problem solving Orientation Level: Disoriented to;Time;Situation           Problem Solving: Slow processing;Decreased initiation;Difficulty sequencing;Requires verbal cues      General Comments      Exercises        Assessment/Plan    PT Assessment Patient needs continued PT services  PT Diagnosis Difficulty walking;Generalized weakness;Altered mental status   PT Problem List Decreased strength;Decreased activity  tolerance;Decreased balance;Decreased mobility;Decreased cognition;Decreased knowledge of use of DME;Cardiopulmonary status limiting activity  PT Treatment Interventions DME instruction;Gait training;Functional mobility training;Therapeutic activities;Therapeutic exercise;Balance training;Cognitive remediation;Patient/family education   PT Goals (Current goals can be found in the Care Plan section) Acute Rehab PT Goals Patient Stated Goal: None stated PT Goal Formulation: With patient Time For Goal Achievement: 03/08/14 Potential to Achieve Goals: Good    Frequency Min 3X/week   Barriers to discharge Decreased caregiver support Patient lives alone.  Reports her brother can stay with her at discharge - need to verify.    Co-evaluation               End of Session   Activity Tolerance: Patient limited by fatigue;Patient limited by lethargy Patient left: in bed;with call bell/phone within reach;with nursing/sitter in room Nurse Communication: Mobility status         Time: [id number] PT Time Calculation (min) (ACUTE ONLY): 17 min   Charges:   PT Evaluation $Initial PT Evaluation Tier I: 1 Procedure PT Treatments $Therapeutic Activity: 8-22 mins   PT G Codes:        Vena AustriaDavis, Matika Bartell H 03/01/2014, 11:18 AM Durenda HurtSusan H. Renaldo Fiddleravis, PT, [facility] Acute Rehab Services Pager [phone]

## 2014-03-01 NOTE — Progress Notes (Signed)
Pt arrived from 31M, VSS, family updated, will continue to monitor.

## 2014-03-02 ENCOUNTER — Encounter (HOSPITAL_COMMUNITY): Payer: Self-pay | Admitting: *Deleted

## 2014-03-02 LAB — GLUCOSE, CAPILLARY
Glucose-Capillary: 311 mg/dL — ABNORMAL HIGH (ref 70–99)
Glucose-Capillary: 299 mg/dL — ABNORMAL HIGH (ref 70–99)
Glucose-Capillary: 356 mg/dL — ABNORMAL HIGH (ref 70–99)
Glucose-Capillary: 161 mg/dL — ABNORMAL HIGH (ref 70–99)

## 2014-03-02 LAB — RENAL FUNCTION PANEL
Albumin: 1.4 g/dL — ABNORMAL LOW (ref 3.5–5.2)
Anion gap: 21 — ABNORMAL HIGH (ref 5–15)
BUN: 94 mg/dL — AB (ref 6–23)
CALCIUM: 8.2 mg/dL — AB (ref 8.4–10.5)
CHLORIDE: 99 mmol/L (ref 96–112)
CO2: 19 mmol/L (ref 19–32)
Creatinine, Ser: 6.44 mg/dL — ABNORMAL HIGH (ref 0.50–1.10)
GFR calc Af Amer: 7 mL/min — ABNORMAL LOW (ref >90)
GFR calc non Af Amer: 6 mL/min — ABNORMAL LOW (ref >90)
Glucose, Bld: 298 mg/dL — ABNORMAL HIGH (ref 70–99)
PHOSPHORUS: 4.4 mg/dL (ref 2.3–4.6)
Potassium: 3.4 mmol/L — ABNORMAL LOW (ref 3.5–5.1)
Sodium: 139 mmol/L (ref 135–145)

## 2014-03-02 LAB — CBC
HCT: 26.5 % — ABNORMAL LOW (ref 36.0–46.0)
HEMOGLOBIN: 9.1 g/dL — AB (ref 12.0–15.0)
MCH: 29.8 pg (ref 26.0–34.0)
MCHC: 34.3 g/dL (ref 30.0–36.0)
MCV: 86.9 fL (ref 78.0–100.0)
Platelets: 281 10*3/uL (ref 150–400)
RBC: 3.05 MIL/uL — ABNORMAL LOW (ref 3.87–5.11)
RDW: 14.3 % (ref 11.5–15.5)
WBC: 11.2 10*3/uL — ABNORMAL HIGH (ref 4.0–10.5)

## 2014-03-02 LAB — CLOSTRIDIUM DIFFICILE BY PCR: Toxigenic C. Difficile by PCR: NEGATIVE

## 2014-03-02 MED ORDER — INSULIN ASPART 100 UNIT/ML ~~LOC~~ SOLN
3.0000 [IU] | Freq: Three times a day (TID) | SUBCUTANEOUS | Status: DC
  Administered 2014-03-02 – 2014-03-06 (×12): 3 [IU] via SUBCUTANEOUS

## 2014-03-02 MED ORDER — CEFTRIAXONE SODIUM IN DEXTROSE 40 MG/ML IV SOLN
2.0000 g | INTRAVENOUS | Status: DC
  Administered 2014-03-03 – 2014-03-04 (×2): 2 g via INTRAVENOUS
  Filled 2014-03-02 (×2): qty 50

## 2014-03-02 MED ORDER — IPRATROPIUM-ALBUTEROL 0.5-2.5 (3) MG/3ML IN SOLN
3.0000 mL | Freq: Four times a day (QID) | RESPIRATORY_TRACT | Status: DC
Start: 2014-03-02 — End: 2014-03-04
  Administered 2014-03-02 – 2014-03-03 (×5): 3 mL via RESPIRATORY_TRACT
  Filled 2014-03-02 (×6): qty 3

## 2014-03-02 MED ORDER — LOPERAMIDE HCL 2 MG PO CAPS
2.0000 mg | ORAL_CAPSULE | ORAL | Status: DC | PRN
  Administered 2014-03-02 – 2014-03-03 (×2): 2 mg via ORAL
  Filled 2014-03-02 (×6): qty 1

## 2014-03-02 MED ORDER — CEFTRIAXONE SODIUM IN DEXTROSE 20 MG/ML IV SOLN: 1.0000 g | Freq: Two times a day (BID) | INTRAVENOUS | Status: DC

## 2014-03-02 MED ORDER — POTASSIUM CHLORIDE CRYS ER 20 MEQ PO TBCR
30.0000 meq | EXTENDED_RELEASE_TABLET | Freq: Once | ORAL | Status: AC
  Administered 2014-03-02: 30 meq via ORAL
  Filled 2014-03-02: qty 1

## 2014-03-02 MED ORDER — INSULIN GLARGINE 100 UNIT/ML ~~LOC~~ SOLN
16.0000 [IU] | Freq: Every day | SUBCUTANEOUS | Status: DC
Start: 2014-03-02 — End: 2014-03-05
  Administered 2014-03-02 – 2014-03-05 (×3): 16 [IU] via SUBCUTANEOUS
  Filled 2014-03-02 (×4): qty 0.16

## 2014-03-02 MED ORDER — STERILE WATER FOR INJECTION IV SOLN
INTRAVENOUS | Status: DC
  Administered 2014-03-02: 16:00:00 via INTRAVENOUS
  Filled 2014-03-02 (×4): qty 850

## 2014-03-02 MED ORDER — ENSURE COMPLETE PO LIQD
237.0000 mL | Freq: Two times a day (BID) | ORAL | Status: DC
  Administered 2014-03-02 – 2014-03-03 (×4): 237 mL via ORAL

## 2014-03-02 MED ORDER — ACETAMINOPHEN 160 MG/5ML PO SOLN
650.0000 mg | ORAL | Status: DC | PRN
  Filled 2014-03-02: qty 20.3

## 2014-03-02 MED ORDER — AMLODIPINE BESYLATE 10 MG PO TABS
10.0000 mg | ORAL_TABLET | Freq: Every day | ORAL | Status: DC
  Administered 2014-03-03 – 2014-03-15 (×13): 10 mg via ORAL
  Filled 2014-03-02 (×13): qty 1

## 2014-03-02 NOTE — Progress Notes (Signed)
ANTIBIOTIC CONSULT NOTE - FOLLOW UP  Pharmacy Consult for Rocephin Indication: pneumonia  No Known Allergies  Patient Measurements: Height: 5\' 7"  (170.2 cm) Weight: 181 lb 14.1 oz (82.5 kg) IBW/kg (Calculated) : 61.6 Adjusted Body Weight:    Vital Signs: Temp: 98.3 F (36.8 C) (01/23 0324) Temp Source: Oral (01/23 0324) BP: 182/63 mmHg (01/23 0710) Pulse Rate: 82 (01/23 0710) Intake/Output from previous day: 01/22 0701 - 01/23 0700 In: 1151.7 [I.V.:1101.7; IV Piggyback:50] Out: 1750 [Urine:1750] Intake/Output from this shift: Total I/O In: 949.2 [I.V.:949.2] Out: 500 [Urine:500]  Labs:  Recent Labs  02/28/14 0520 02/28/14 1013 03/01/14 0712 03/02/14 0450  WBC 14.2*  --  12.8* 11.2*  HGB 8.4*  --  9.4* 9.1*  PLT 143*  --  218 281  LABCREA  --  34.52  --   --   CREATININE 4.91*  --  5.68* 6.44*   Estimated Creatinine Clearance: 10.5 mL/min (by C-G formula based on Cr of 6.44). No results for input(s): VANCOTROUGH, VANCOPEAK, VANCORANDOM, GENTTROUGH, GENTPEAK, GENTRANDOM, TOBRATROUGH, TOBRAPEAK, TOBRARND, AMIKACINPEAK, AMIKACINTROU, AMIKACIN in the last 72 hours.   Microbiology: Recent Results (from the past 720 hour(s))  MRSA PCR Screening     Status: None   Collection Time: 02/24/14  4:13 PM  Result Value Ref Range Status   MRSA by PCR NEGATIVE NEGATIVE Final    Comment:        The GeneXpert MRSA Assay (FDA approved for NASAL specimens only), is one component of a comprehensive MRSA colonization surveillance program. It is not intended to diagnose MRSA infection nor to guide or monitor treatment for MRSA infections.   Culture, blood (routine x 2) Call MD if unable to obtain prior to antibiotics being given     Status: None   Collection Time: 02/24/14  6:09 PM  Result Value Ref Range Status   Specimen Description BLOOD RIGHT HAND  Final   Special Requests BOTTLES DRAWN AEROBIC AND ANAEROBIC 6CC  Final   Culture NO GROWTH 5 DAYS  Final   Report Status  03/01/2014 FINAL  Final  Culture, blood (routine x 2) Call MD if unable to obtain prior to antibiotics being given     Status: None   Collection Time: 02/24/14  6:25 PM  Result Value Ref Range Status   Specimen Description BLOOD PICC LINE DRAWN BY RN  Final   Special Requests BOTTLES DRAWN AEROBIC AND ANAEROBIC 6CC  Final   Culture NO GROWTH 5 DAYS  Final   Report Status 03/01/2014 FINAL  Final  Culture, respiratory (NON-Expectorated)     Status: None   Collection Time: 02/24/14 11:35 PM  Result Value Ref Range Status   Specimen Description SPUTUM  Final   Special Requests NONE  Final   Gram Stain   Final    ABUNDANT WBC PRESENT, PREDOMINANTLY PMN NO SQUAMOUS EPITHELIAL CELLS SEEN ABUNDANT GRAM POSITIVE COCCI IN PAIRS [facility]    Culture   Final    FEW STREPTOCOCCUS PNEUMONIAE Performed at [facility]    Report Status 02/28/2014 FINAL  Final   Organism ID, Bacteria STREPTOCOCCUS PNEUMONIAE  Final      Susceptibility   Streptococcus pneumoniae - MIC (ETEST)*    CEFTRIAXONE 0.012 SENSITIVE Sensitive     LEVOFLOXACIN 0.50 SENSITIVE Sensitive     PENICILLIN 0.016 SENSITIVE Sensitive     * FEW STREPTOCOCCUS PNEUMONIAE  MRSA PCR Screening     Status: None   Collection Time: 02/27/14  6:27 PM  Result Value Ref Range Status   MRSA by PCR NEGATIVE NEGATIVE Final    Comment:        The GeneXpert MRSA Assay (FDA approved for NASAL specimens only), is one component of a comprehensive MRSA colonization surveillance program. It is not intended to diagnose MRSA infection nor to guide or monitor treatment for MRSA infections.     Anti-infectives    Start     Dose/Rate Route Frequency Ordered Stop   02/28/14 1200  cefTRIAXone (ROCEPHIN) 1 g in dextrose 5 % 50 mL IVPB - Premix     1 g100 mL/hr over 30 Minutes Intravenous Every 24 hours 02/28/14 0929 03/05/14 2359   02/27/14 1700  vancomycin (VANCOCIN) IVPB 1000 mg/200 mL premix  Status:   Discontinued     1,000 mg200 mL/hr over 60 Minutes Intravenous Every 48 hours 02/26/14 1254 02/28/14 0917   02/27/14 1400  piperacillin-tazobactam (ZOSYN) IVPB 2.25 g  Status:  Discontinued     2.25 g100 mL/hr over 30 Minutes Intravenous 3 times per day 02/27/14 0816 02/28/14 0917   02/27/14 1300  fluconazole (DIFLUCAN) IVPB 100 mg  Status:  Discontinued     100 mg50 mL/hr over 60 Minutes Intravenous Every 24 hours 02/27/14 1159 02/27/14 1915   02/24/14 1800  vancomycin (VANCOCIN) IVPB 1000 mg/200 mL premix  Status:  Discontinued     1,000 mg200 mL/hr over 60 Minutes Intravenous Every 24 hours 02/24/14 1602 02/26/14 1254   02/24/14 1700  piperacillin-tazobactam (ZOSYN) IVPB 3.375 g  Status:  Discontinued     3.375 g12.5 mL/hr over 240 Minutes Intravenous Every 8 hours 02/24/14 1602 02/27/14 0815   02/24/14 1215  cefTRIAXone (ROCEPHIN) 1 g in dextrose 5 % 50 mL IVPB     1 g100 mL/hr over 30 Minutes Intravenous  Once 02/24/14 1208 02/24/14 1416   02/24/14 1215  azithromycin (ZITHROMAX) 500 mg in dextrose 5 % 250 mL IVPB     500 mg250 mL/hr over 60 Minutes Intravenous  Once 02/24/14 1208 02/24/14 1318      Assessment: Infectious Disease: Abx #7/10 for Strep pneumo PNA (10d b/c severe infxn). 1/19 CXR showed B/L airspace opacities - afebrile, WBC 11.2 up slightly., LA clearing, PCT 25.  1/17 Vanc >> 1/21 1/17 Zosyn >> 1/21 1/21 CTX >>   1/17 Sputum cx - Strep pneumo (pan sens) 1/17 BCx x2 - negative  Goal of Therapy:  Eradication of PNA  Plan:  Rocephin 1g IV q24h.  No renal adjustment required Pharmacy will sign off.   Rennie Hack S. Merilynn Finland, PharmD, [facility] Clinical Staff Pharmacist Pager [phone]  Misty Stanley Stillinger 03/02/2014,9:45 AM

## 2014-03-02 NOTE — Progress Notes (Signed)
[facility] Subjective:  Awake, sitting up in the chair Conversant "My bowels are running off" (Has had several loose stools - CDiff negative) Remains on IVF/bicarb drip Rate at 75 Still has gap acidosis Making good urine but creatinine is still rising   Objective Vital signs in last 24 hours: Filed Vitals:   03/02/14 0235 03/02/14 0324 03/02/14 0710 03/02/14 0801  BP:  168/62 182/63   Pulse:  83 82   Temp:  98.3 F (36.8 C)    TempSrc:  Oral    Resp:  22 19   Height:      Weight:  82.5 kg (181 lb 14.1 oz)    SpO2: 95% 96% 95% 95%   Weight change: 1.1 kg (2 lb 6.8 oz)  Intake/Output Summary (Last 24 hours) at 03/02/14 1048 Last data filed at 03/02/14 0759  Gross per 24 hour  Intake 1849.17 ml  Output   1750 ml  Net  99.17 ml   Physical Exam:   BP 182/63 mmHg  Pulse 82  Temp(Src) 98.3 F (36.8 C) (Oral)  Resp 19  Ht 5\' 7"  (1.702 m)  Wt 82.5 kg (181 lb 14.1 oz)  BMI 28.48 kg/m2  SpO2 95%  LMP   Gen: Non-acutely ill appearing AAF sitting up in the chair NAD VS as noted Skin: no rash, cyanosis Neck: no JVD Chest: Anteriorly clear Heart: S1S2 No S3 No rub Abdomen: soft, No focal abdominal tenderness Ext: No LE edema.  Clear urine in foley   Labs:  Recent Labs Lab 02/26/14 1749 02/27/14 0311 02/27/14 1822 02/28/14 0017 02/28/14 0520 03/01/14 0712 03/02/14 0450  NA 138 137 135 138 134* 137 139  K 3.3* 3.4* 3.7 3.6 3.3* 3.5 3.4*  CL 112 109 104 105 103 101 99  CO2 17* 18* 20 18* 19 19 19   GLUCOSE 159* 168* 99 263* 154* 209* 298*  BUN 59* 64* 68* 90* 70* 89* 94*  CREATININE 3.77* 4.35* 4.83* 5.69* 4.91* 5.68* 6.44*  CALCIUM 8.4 8.4 8.1* 8.5 8.0* 8.4 8.2*  PHOS  --   --   --   --   --  3.9 4.4     Recent Labs Lab 02/26/14 0545 02/28/14 0520 03/01/14 0712 03/02/14 0450  AST 21 16  --   --   ALT 19 14  --   --   ALKPHOS 151* 279*  --   --   BILITOT 0.6 0.6  --   --   PROT 5.6* 5.0*  --   --   ALBUMIN 1.7* 1.3* 1.5* 1.4*    Recent Labs Lab 02/24/14 1029  02/27/14 2100 02/28/14 0520 03/01/14 0712 03/02/14 0450  WBC 32.9*  < > 17.0* 14.2* 12.8* 11.2*  NEUTROABS 31.0*  --   --   --   --   --   HGB 11.8*  < > 9.0* 8.4* 9.4* 9.1*  HCT 36.7  < > 25.9* 24.2* 26.8* 26.5*  MCV 96.3  < > 89.3 89.3 88.2 86.9  PLT 323  < > 142* 143* 218 281  < > = values in this interval not displayed.  Recent Labs Lab 02/24/14 1029 02/25/14 0442  CKTOTAL 102 32  TROPONINI <0.03  --     Recent Labs Lab 03/01/14 0747 03/01/14 1146 03/01/14 1646 03/01/14 2149 03/02/14 0743  GLUCAP 186* 191* 216* 205* 311*   Medications: . sodium chloride 10 mL/hr (03/01/14 0650)  .  sodium bicarbonate  infusion 1000 mL 75 mL/hr at 03/01/14  1610   . amLODipine  5 mg Oral Daily  . antiseptic oral rinse  7 mL Mouth Rinse QID  . cefTRIAXone (ROCEPHIN)  IV  1 g Intravenous Q24H  . chlorhexidine  15 mL Mouth Rinse BID  . famotidine  20 mg Per Tube Daily  . feeding supplement (ENSURE COMPLETE)  237 mL Oral BID BM  . insulin aspart  0-15 Units Subcutaneous TID AC & HS  . insulin glargine  10 Units Subcutaneous QHS  . ipratropium-albuterol  3 mL Nebulization Q6H  . sodium chloride  10-40 mL Intracatheter Q12H    I  have reviewed scheduled and prn medications.  Background: 59 y.o. year-old AAF with little known PMH who was evaluated at [facility] 1/17 for AMS, found to be in DKA with profound gap acidosis, septic from PNA with + S. Pneumo, required ventilator support, IV insulin, ATB's, bicarb drip. Developed AKI (unknown baseline - 1.8 on admission to [facility] and as low as 1.38 earlier in that hosp) in the setting of DKA/PNA/sepsis. Serologic w/u prior to transfer (ANA, ANCA, complements, Hep B and C) all negative or normal, 978 mg proteinuria and ultrasound 12 and 13.2 cm kidneys. Neg tox screen except for THC.   Impression/Plan  1. AKI - Suspect underlying CKD d/t DM but baseline not known. Appears euvolemic. Probable ATN (sepsis/DKA).  Creatinine still rising but UOP clearly picking up. Does not need additional workup - just time.Marland KitchenMarland KitchenHope she will plateau soon.  Would continue fluids at 75 (I note bicarb mixed in D5 - apparently can no longer mix in sterile water).  No dialysis indications 2. Hypokalemia - KDur 30 today 3. Metabolic acidosis - persistent gap. On IV bicarb. 4. DKA - DM management per primary service. DM apparently not previously known prior to current presentation 5. VDRF - now extubated and off oxygen with no resp complaints 6. AMS - continues to improve 7. Sepsis/PNA - + S. Pneumo in sputum. Rocephin. WBC continues to improve (32K on adm) 8. Anemia - acute post adm. Monitoring 9. Thrombocytopenia - resolved. Plts back to normal 10. Diarrhea - CDiff negative this am 1/23   Camille Bal, MD [facility] [phone] pager 03/02/2014, 10:48 AM

## 2014-03-02 NOTE — Progress Notes (Signed)
[place] TEAM 1 - Stepdown/ICU TEAM Progress Note  Jackie Fisher:[id number] DOB: 1955-05-19 DOA: 02/24/2014 PCP: No primary care provider on file.  Admit HPI / Brief Narrative: 59yo F who presented with DKA 1/17 to [facility] ED after being found unresponsive at home by her family. Found to be in AKI, septic, and in hypoxic resp failure due to aspiration pna. Profoundly acidotic despite bicarb gtt. Was taken off insulin gtt and got hyperglycemic so was put back on it. Still having non gap acidosis despite being on bicarb drip. Sputum cx growing strep pneumo. Intubated.  CT head normal.   Significant Events: 1/17 - admitted. DKA, severe sepsis. Started on vanc+zosyn. and insulin gtt, DKA protocol. Intubated for AMS 1/18 - 1/19 Gap closed but had hyperglycemia off insulin gtt so put back on it. Continued to have non gap acidosis. White count trending down. hgb dropped from 11.8 to 8-9 plt dropped from 323 --> 137. HIIT panel sent.  1/21 Extubated  HPI/Subjective: Pt is resting comfortably in bed.  She is conversant but her affect is flat and she is somnolent.  She denies cp, nausea, or vomiting.  No abdom pain.   Assessment/Plan:  Anion gap metabolic acidosis Due to ARF - on bicarb gtt - follow  DKA - Uncontrolled ?newly diagnosed DM A1c 13.2 - ?newly diagnosed DM - little is known about her past med hx - CBG is climbing - adjust tx and follow - STOP dextrose in IVF   Bil Strep pneumo Pneumonia Cont abx coverage - WBC improving   Acute Kidney failure Unknown baseline crt - crt 1.8 at initial presentation - probable ATN - Nephrology is following   Thrombocytopenia HIT panel negative - follow plts   Hypokalemia  Will not tx at 3.4 given climbing crt - follow   Anemia  Diarrhea   C diff negative - trial of immodium   HTN BP poorly controlled - adjust medical tx and follow trend   Code Status: FULL Family Communication: no family present at time of exam Disposition  Plan: SDU  Consultants: Nephrology PCCM  Antibiotics: Rocephin 1/21 > Zosyn 1/17 > 1/20 Vanc 1/17 > 1/20  DVT prophylaxis: SCDs  Objective: Blood pressure 182/63, pulse 82, temperature 98.4 F (36.9 C), temperature source Oral, resp. rate 19, height  (1.702 m), weight 82.5 kg (181 lb 14.1 oz), SpO2 95 %.  Intake/Output Summary (Last 24 hours) at 03/02/14 1306 Last data filed at 03/02/14 1206  Gross per 24 hour  Intake 1594.17 ml  Output   2000 ml  Net -405.83 ml   Exam: General: No acute respiratory distress Lungs: Clear to auscultation bilaterally without wheezes or crackles Cardiovascular: Regular rate and rhythm without murmur gallop or rub Abdomen: Nontender, nondistended, soft, bowel sounds positive, no rebound, no ascites, no appreciable mass Extremities: No significant cyanosis, clubbing, or edema bilateral lower extremities  Data Reviewed: Basic Metabolic Panel:  Recent Labs Lab 02/27/14 1822 02/28/14 0017 02/28/14 0520 03/01/14 0712 03/02/14 0450  NA 135 138 134* 137 139  K 3.7 3.6 3.3* 3.5 3.4*  CL 104 105 103 101 99  CO2 20 18* GLUCOSE 99 263* 154* 209* 298*  BUN 68* 90* 70* 89* 94*  CREATININE 4.83* 5.69* 4.91* 5.68* 6.44*  CALCIUM 8.1* 8.5 8.0* 8.4 8.2*  PHOS  --   --   --  3.9 4.4    Liver Function Tests:  Recent Labs Lab 02/26/14 0545 02/28/14 0520 03/01/14 9562  03/02/14 0450  AST 21 16  --   --   ALT 19 14  --   --   ALKPHOS 151* 279*  --   --   BILITOT 0.6 0.6  --   --   PROT 5.6* 5.0*  --   --   ALBUMIN 1.7* 1.3* 1.5* 1.4*   CBC:  Recent Labs Lab 02/24/14 1029  02/27/14 0311 02/27/14 2100 02/28/14 0520 03/01/14 0712 03/02/14 0450  WBC 32.9*  < > 17.9* 17.0* 14.2* 12.8* 11.2*  NEUTROABS 31.0*  --   --   --   --   --   --   HGB 11.8*  < > 8.3* 9.0* 8.4* 9.4* 9.1*  HCT 36.7  < > 23.7* 25.9* 24.2* 26.8* 26.5*  MCV 96.3  < > 91.2 89.3 89.3 88.2 86.9  PLT 323  < > 137* 142* 143* 218 281  < > = values in  this interval not displayed.  Cardiac Enzymes:  Recent Labs Lab 02/24/14 1029 02/25/14 0442  CKTOTAL 102 32  TROPONINI <0.03  --    CBG:  Recent Labs Lab 03/01/14 1146 03/01/14 1646 03/01/14 2149 03/02/14 0743 03/02/14 1122  GLUCAP 191* 216* 205* 311* 299*    Recent Results (from the past 240 hour(s))  MRSA PCR Screening     Status: None   Collection Time: 02/24/14  4:13 PM  Result Value Ref Range Status   MRSA by PCR NEGATIVE NEGATIVE Final    Comment:        The GeneXpert MRSA Assay (FDA approved for NASAL specimens only), is one component of a comprehensive MRSA colonization surveillance program. It is not intended to diagnose MRSA infection nor to guide or monitor treatment for MRSA infections.   Culture, blood (routine x 2) Call MD if unable to obtain prior to antibiotics being given     Status: None   Collection Time: 02/24/14  6:09 PM  Result Value Ref Range Status   Specimen Description BLOOD RIGHT HAND  Final   Special Requests BOTTLES DRAWN AEROBIC AND ANAEROBIC 6CC  Final   Culture NO GROWTH 5 DAYS  Final   Report Status 03/01/2014 FINAL  Final  Culture, blood (routine x 2) Call MD if unable to obtain prior to antibiotics being given     Status: None   Collection Time: 02/24/14  6:25 PM  Result Value Ref Range Status   Specimen Description BLOOD PICC LINE DRAWN BY RN  Final   Special Requests BOTTLES DRAWN AEROBIC AND ANAEROBIC 6CC  Final   Culture NO GROWTH 5 DAYS  Final   Report Status 03/01/2014 FINAL  Final  Culture, respiratory (NON-Expectorated)     Status: None   Collection Time: 02/24/14 11:35 PM  Result Value Ref Range Status   Specimen Description SPUTUM  Final   Special Requests NONE  Final   Gram Stain   Final    ABUNDANT WBC PRESENT, PREDOMINANTLY PMN NO SQUAMOUS EPITHELIAL CELLS SEEN ABUNDANT GRAM POSITIVE COCCI IN PAIRS Performed at [facility]    Culture   Final    FEW STREPTOCOCCUS PNEUMONIAE Performed at [facility]    Report Status 02/28/2014 FINAL  Final   Organism ID, Bacteria STREPTOCOCCUS PNEUMONIAE  Final      Susceptibility   Streptococcus pneumoniae - MIC (ETEST)*    CEFTRIAXONE 0.012 SENSITIVE Sensitive     LEVOFLOXACIN 0.50 SENSITIVE Sensitive     PENICILLIN 0.016 SENSITIVE Sensitive     *  FEW STREPTOCOCCUS PNEUMONIAE  MRSA PCR Screening     Status: None   Collection Time: 02/27/14  6:27 PM  Result Value Ref Range Status   MRSA by PCR NEGATIVE NEGATIVE Final    Comment:        The GeneXpert MRSA Assay (FDA approved for NASAL specimens only), is one component of a comprehensive MRSA colonization surveillance program. It is not intended to diagnose MRSA infection nor to guide or monitor treatment for MRSA infections.   Clostridium Difficile by PCR     Status: None   Collection Time: 03/02/14  7:11 AM  Result Value Ref Range Status   C difficile by pcr NEGATIVE NEGATIVE Final     Studies:  Recent x-ray studies have been reviewed in detail by the Attending Physician  Scheduled Meds:  Scheduled Meds: . amLODipine  5 mg Oral Daily  . antiseptic oral rinse  7 mL Mouth Rinse QID  . cefTRIAXone (ROCEPHIN)  IV  1 g Intravenous Q24H  . chlorhexidine  15 mL Mouth Rinse BID  . famotidine  20 mg Per Tube Daily  . feeding supplement (ENSURE COMPLETE)  237 mL Oral BID BM  . insulin aspart  0-15 Units Subcutaneous TID AC & HS  . insulin glargine  10 Units Subcutaneous QHS  . ipratropium-albuterol  3 mL Nebulization Q6H  . sodium chloride  10-40 mL Intracatheter Q12H    Time spent on care of this patient: 35 mins   MCCLUNG,JEFFREY T , MD   [facility] Office  [phone] Pager - Text Page per Loretha StaplerAmion as per below:  On-Call/Text Page:      [email or web address]      password [email or web address]  If 7PM-7AM, please contact night-coverage [email or web address] Password [email or web address] 03/02/2014, 1:06 PM   LOS: 6 days

## 2014-03-03 LAB — RENAL FUNCTION PANEL
ANION GAP: 11 (ref 5–15)
Albumin: 1.5 g/dL — ABNORMAL LOW (ref 3.5–5.2)
BUN: 93 mg/dL — AB (ref 6–23)
CALCIUM: 7.9 mg/dL — AB (ref 8.4–10.5)
CO2: 30 mmol/L (ref 19–32)
CREATININE: 6.53 mg/dL — AB (ref 0.50–1.10)
Chloride: 98 mmol/L (ref 96–112)
GFR calc Af Amer: 7 mL/min — ABNORMAL LOW (ref >90)
GFR calc non Af Amer: 6 mL/min — ABNORMAL LOW (ref >90)
GLUCOSE: 160 mg/dL — AB (ref 70–99)
PHOSPHORUS: 4 mg/dL (ref 2.3–4.6)
Potassium: 3.3 mmol/L — ABNORMAL LOW (ref 3.5–5.1)
SODIUM: 139 mmol/L (ref 135–145)

## 2014-03-03 LAB — CBC
HEMATOCRIT: 26 % — AB (ref 36.0–46.0)
Hemoglobin: 9 g/dL — ABNORMAL LOW (ref 12.0–15.0)
MCH: 30 pg (ref 26.0–34.0)
MCHC: 34.6 g/dL (ref 30.0–36.0)
MCV: 86.7 fL (ref 78.0–100.0)
Platelets: 329 10*3/uL (ref 150–400)
RBC: 3 MIL/uL — ABNORMAL LOW (ref 3.87–5.11)
RDW: 14.1 % (ref 11.5–15.5)
WBC: 12 10*3/uL — ABNORMAL HIGH (ref 4.0–10.5)

## 2014-03-03 LAB — GLUCOSE, CAPILLARY
GLUCOSE-CAPILLARY: 167 mg/dL — AB (ref 70–99)
Glucose-Capillary: 157 mg/dL — ABNORMAL HIGH (ref 70–99)
Glucose-Capillary: 127 mg/dL — ABNORMAL HIGH (ref 70–99)
Glucose-Capillary: 193 mg/dL — ABNORMAL HIGH (ref 70–99)

## 2014-03-03 MED ORDER — GLUCERNA SHAKE PO LIQD
237.0000 mL | Freq: Three times a day (TID) | ORAL | Status: DC | PRN
  Administered 2014-03-05: 1 mL via ORAL
  Administered 2014-03-10: 237 mL via ORAL
  Filled 2014-03-03 (×2): qty 237

## 2014-03-03 MED ORDER — SODIUM CHLORIDE 0.9 % IV SOLN
INTRAVENOUS | Status: DC
  Administered 2014-03-03 – 2014-03-04 (×2): via INTRAVENOUS

## 2014-03-03 MED ORDER — POTASSIUM CHLORIDE CRYS ER 20 MEQ PO TBCR
30.0000 meq | EXTENDED_RELEASE_TABLET | Freq: Once | ORAL | Status: AC
  Administered 2014-03-03: 30 meq via ORAL
  Filled 2014-03-03: qty 1

## 2014-03-03 NOTE — Progress Notes (Signed)
[facility] Subjective:  On the bedpan Still with some loose stools Awake, alert, soft spoken  Objective Vital signs in last 24 hours: Filed Vitals:   03/03/14 0302 03/03/14 0400 03/03/14 0815 03/03/14 0825  BP: 152/58  167/68   Pulse: 79 78 77   Temp: 99.1 F (37.3 C)  98 F (36.7 C)   TempSrc: Oral  Oral   Resp: Height:      Weight: 84.2 kg (185 lb 10 oz)     SpO2: 94% 94% 97% 95%   Weight change: 1.7 kg (3 lb 12 oz)  Intake/Output Summary (Last 24 hours) at 03/03/14 1000 Last data filed at 03/03/14 0800  Gross per 24 hour  Intake 1697.58 ml  Output   1325 ml  Net 372.58 ml   Physical Exam:   BP 167/68 mmHg  Pulse 77  Temp(Src) 98 F (36.7 C) (Oral)  Resp 18  Ht  (1.702 m)  Wt 84.2 kg (185 lb 10 oz)  BMI 29.07 kg/m2  SpO2 95%  LMP   Gen: Non-acutely ill appearing AAF  On the bedpan NAD VS as noted Skin: no rash, cyanosis Neck: no JVD Chest: Anteriorly clear Heart: S1S2 No S3 No rub Soft M USB Abdomen: soft, No focal abdominal tenderness Ext: No LE edema.  Clear urine in foley  Labs:  Recent Labs Lab 02/27/14 0311 02/27/14 1822 02/28/14 0017 02/28/14 0520 03/01/14 0712 03/02/14 0450 03/03/14 0400  NA 137 135 138 134* 137 139 139  K 3.4* 3.7 3.6 3.3* 3.5 3.4* 3.3*  CL 109 104 105 103 101 99 98  CO2 18* 20 18* GLUCOSE 168* 99 263* 154* 209* 298* 160*  BUN 64* 68* 90* 70* 89* 94* 93*  CREATININE 4.35* 4.83* 5.69* 4.91* 5.68* 6.44* 6.53*  CALCIUM 8.4 8.1* 8.5 8.0* 8.4 8.2* 7.9*  PHOS  --   --   --   --  3.9 4.4 4.0     Recent Labs Lab 02/26/14 0545 02/28/14 0520 03/01/14 0712 03/02/14 0450 03/03/14 0400  AST 21 16  --   --   --   ALT 19 14  --   --   --   ALKPHOS 151* 279*  --   --   --   BILITOT 0.6 0.6  --   --   --   PROT 5.6* 5.0*  --   --   --   ALBUMIN 1.7* 1.3* 1.5* 1.4* 1.5*   Recent Labs Lab 02/24/14 1029  02/28/14 0520 03/01/14 0712 03/02/14 0450 03/03/14 0400  WBC  32.9*  < > 14.2* 12.8* 11.2* 12.0*  NEUTROABS 31.0*  --   --   --   --   --   HGB 11.8*  < > 8.4* 9.4* 9.1* 9.0*  HCT 36.7  < > 24.2* 26.8* 26.5* 26.0*  MCV 96.3  < > 89.3 88.2 86.9 86.7  PLT 323  < > 143* 218 281 329  < > = values in this interval not displayed.  Recent Labs Lab 02/24/14 1029 02/25/14 0442  CKTOTAL 102 32  TROPONINI <0.03  --     Recent Labs Lab 03/02/14 0743 03/02/14 1122 03/02/14 1648 03/02/14 2100 03/03/14 0818  GLUCAP 311* 299* 356* 161* 157*   Medications: .  sodium bicarbonate 150 mEq in sterile water 1000 mL infusion 75 mL/hr at 03/02/14 1613   . amLODipine  10 mg Oral Daily  . antiseptic  oral rinse  7 mL Mouth Rinse QID  . cefTRIAXone (ROCEPHIN)  IV  2 g Intravenous Q24H  . chlorhexidine  15 mL Mouth Rinse BID  . feeding supplement (ENSURE COMPLETE)  237 mL Oral BID BM  . insulin aspart  0-15 Units Subcutaneous TID AC & HS  . insulin aspart  3 Units Subcutaneous TID WC  . insulin glargine  16 Units Subcutaneous QHS  . ipratropium-albuterol  3 mL Nebulization QID    I  have reviewed scheduled and prn medications.  Background: 59 y.o. year-old AAF with little known PMH [facility], found to be in DKA with profound gap acidosis, septic from PNA with + S. Pneumo, required ventilator support, IV insulin, ATB's, bicarb drip. Developed AKI (unknown baseline - [facility] and as low as 1.38 earlier in that hosp) in the setting of DKA/PNA/sepsis. Serologic w/u prior to transfer (ANA, ANCA, complements, Hep B and C) all negative or normal, 978 mg proteinuria and ultrasound 12 and 13.2 cm kidneys. Neg tox screen except for THC.   Impression/Plan  1. AKI - Suspect underlying CKD d/t DM but baseline not known. Appears euvolemic. Probable ATN (sepsis/DKA). Creatinine appears to be reaching a plateau. Would continue fluids, slow down rate, take the bicarb out. No dialysis indications 2. Hypokalemia - KDur 30 yesterday.  Repeat today.   3. Metabolic acidosis - persistent gap but CO2 up to 30? Take bicarb out of fluids. 4. DKA - DM management per primary service. DM apparently not previously known prior to current presentation 5. VDRF - now extubated and off oxygen with no resp complaints 6. AMS - continues to improve. CT brain neg. 7. Sepsis/PNA - + S. Pneumo in sputum. Rocephin. WBC continues to improve (32K on adm) 8. Anemia - acute post adm. Monitoring 9. Thrombocytopenia - resolved. Plts back to normal 10. Diarrhea - CDiff negative  1/23   Jackie Balynthia Myrene Bougher, MD [facility] [phone] pager 03/03/2014, 10:00 AM

## 2014-03-03 NOTE — Progress Notes (Signed)
[place] TEAM 1 - Stepdown/ICU TEAM Progress Note  Jackie Fisher ZOX:[id number] DOB: Jun 04, 1955 DOA: 02/24/2014 PCP: No primary care provider on file.  Admit HPI / Brief Narrative: 59yo F who presented with DKA 1/17 to [facility] ED after being found unresponsive at home by her family. Found to be in AKI, septic, and in hypoxic resp failure due to aspiration pna. Profoundly acidotic despite bicarb gtt. Was taken off insulin gtt and got hyperglycemic so was put back on it. Still having non gap acidosis despite being on bicarb drip. Sputum cx growing strep pneumo. Intubated.  CT head normal.   Significant Events: 1/17 - admitted. DKA, severe sepsis. Started on vanc+zosyn. and insulin gtt, DKA protocol. Intubated for AMS 1/18 - 1/19 Gap closed but had hyperglycemia off insulin gtt so put back on it. Continued to have non gap acidosis. White count trending down. hgb dropped from 11.8 to 8-9 plt dropped from 323 --> 137. HIIT panel sent.  1/21 Extubated  HPI/Subjective: Affect remains flat but pt is much more alert today.  She if very hungry and wishes to advance to a regular diet.  She denies cp, abdom pain, sob, or n/v.    Assessment/Plan:  Anion gap metabolic acidosis Due to ARF - Neph is stopping IV bicarb - follow   DKA - Uncontrolled ?newly diagnosed DM A1c 13.2 - ?newly diagnosed DM - little is known about her past med hx - CBG better controlled today - follow w/o change in tx plan today - NO dextrose in IVF   Bil Strep pneumo Pneumonia Cont abx coverage - WBC improving - clinically stabilizing   Acute Kidney failure Unknown baseline crt - crt 1.8 at initial presentation - probable ATN - Nephrology is following   Thrombocytopenia HIT panel negative - plts have now normalized/recovered   Hypokalemia  Replacement as per Nephrology   Anemia Hgb stable at ~9  Diarrhea   C diff negative - trial of immodium   HTN BP slightly improved today - follow w/o change today   Code  Status: FULL Family Communication: spoke w/ 2 sisters at bedside  Disposition Plan: SDU  Consultants: Nephrology PCCM  Antibiotics: Rocephin 1/21 > Zosyn 1/17 > 1/20 Vanc 1/17 > 1/20  DVT prophylaxis: SCDs  Objective: Blood pressure 155/61, pulse 77, temperature 98.5 F (36.9 C), temperature source Oral, resp. rate 18, height  (1.702 m), weight 84.2 kg (185 lb 10 oz), SpO2 96 %.  Intake/Output Summary (Last 24 hours) at 03/03/14 1548 Last data filed at 03/03/14 1100  Gross per 24 hour  Intake 1379.25 ml  Output   1525 ml  Net -145.75 ml   Exam: General: No acute respiratory distress - alert and conversant  Lungs: Clear to auscultation bilaterally without wheezes or crackles Cardiovascular: Regular rate and rhythm without murmur gallop or rub Abdomen: Nontender, nondistended, soft, bowel sounds positive, no rebound, no ascites, no appreciable mass Extremities: No significant cyanosis, clubbing, or edema bilateral lower extremities  Data Reviewed: Basic Metabolic Panel:  Recent Labs Lab 02/28/14 0017 02/28/14 0520 03/01/14 0712 03/02/14 0450 03/03/14 0400  NA 138 134* 137 139 139  K 3.6 3.3* 3.5 3.4* 3.3*  CL 105 103 101 99 98  CO2 18* GLUCOSE 263* 154* 209* 298* 160*  BUN 90* 70* 89* 94* 93*  CREATININE 5.69* 4.91* 5.68* 6.44* 6.53*  CALCIUM 8.5 8.0* 8.4 8.2* 7.9*  PHOS  --   --  3.9 4.4 4.0  Liver Function Tests:  Recent Labs Lab 02/26/14 0545 02/28/14 0520 03/01/14 0712 03/02/14 0450 03/03/14 0400  AST 21 16  --   --   --   ALT 19 14  --   --   --   ALKPHOS 151* 279*  --   --   --   BILITOT 0.6 0.6  --   --   --   PROT 5.6* 5.0*  --   --   --   ALBUMIN 1.7* 1.3* 1.5* 1.4* 1.5*   CBC:  Recent Labs Lab 02/27/14 2100 02/28/14 0520 03/01/14 0712 03/02/14 0450 03/03/14 0400  WBC 17.0* 14.2* 12.8* 11.2* 12.0*  HGB 9.0* 8.4* 9.4* 9.1* 9.0*  HCT 25.9* 24.2* 26.8* 26.5* 26.0*  MCV 89.3 89.3 88.2 86.9 86.7  PLT 142* 143*  218 281 329    Cardiac Enzymes:  Recent Labs Lab 02/25/14 0442  CKTOTAL 32   CBG:  Recent Labs Lab 03/02/14 1122 03/02/14 1648 03/02/14 2100 03/03/14 0818 03/03/14 1158  GLUCAP 299* 356* 161* 157* 167*    Recent Results (from the past 240 hour(s))  MRSA PCR Screening     Status: None   Collection Time: 02/24/14  4:13 PM  Result Value Ref Range Status   MRSA by PCR NEGATIVE NEGATIVE Final    Comment:        The GeneXpert MRSA Assay (FDA approved for NASAL specimens only), is one component of a comprehensive MRSA colonization surveillance program. It is not intended to diagnose MRSA infection nor to guide or monitor treatment for MRSA infections.   Culture, blood (routine x 2) Call MD if unable to obtain prior to antibiotics being given     Status: None   Collection Time: 02/24/14  6:09 PM  Result Value Ref Range Status   Specimen Description BLOOD RIGHT HAND  Final   Special Requests BOTTLES DRAWN AEROBIC AND ANAEROBIC 6CC  Final   Culture NO GROWTH 5 DAYS  Final   Report Status 03/01/2014 FINAL  Final  Culture, blood (routine x 2) Call MD if unable to obtain prior to antibiotics being given     Status: None   Collection Time: 02/24/14  6:25 PM  Result Value Ref Range Status   Specimen Description BLOOD PICC LINE DRAWN BY RN  Final   Special Requests BOTTLES DRAWN AEROBIC AND ANAEROBIC 6CC  Final   Culture NO GROWTH 5 DAYS  Final   Report Status 03/01/2014 FINAL  Final  Culture, respiratory (NON-Expectorated)     Status: None   Collection Time: 02/24/14 11:35 PM  Result Value Ref Range Status   Specimen Description SPUTUM  Final   Special Requests NONE  Final   Gram Stain   Final    ABUNDANT WBC PRESENT, PREDOMINANTLY PMN NO SQUAMOUS EPITHELIAL CELLS SEEN ABUNDANT GRAM POSITIVE COCCI IN PAIRS Performed at [facility]    Culture   Final    FEW STREPTOCOCCUS PNEUMONIAE Performed at [facility]    Report Status 02/28/2014 FINAL  Final     Organism ID, Bacteria STREPTOCOCCUS PNEUMONIAE  Final      Susceptibility   Streptococcus pneumoniae - MIC (ETEST)*    CEFTRIAXONE 0.012 SENSITIVE Sensitive     LEVOFLOXACIN 0.50 SENSITIVE Sensitive     PENICILLIN 0.016 SENSITIVE Sensitive     * FEW STREPTOCOCCUS PNEUMONIAE  MRSA PCR Screening     Status: None   Collection Time: 02/27/14  6:27 PM  Result Value Ref Range Status  MRSA by PCR NEGATIVE NEGATIVE Final    Comment:        The GeneXpert MRSA Assay (FDA approved for NASAL specimens only), is one component of a comprehensive MRSA colonization surveillance program. It is not intended to diagnose MRSA infection nor to guide or monitor treatment for MRSA infections.   Clostridium Difficile by PCR     Status: None   Collection Time: 03/02/14  7:11 AM  Result Value Ref Range Status   C difficile by pcr NEGATIVE NEGATIVE Final     Studies:  Recent x-ray studies have been reviewed in detail by the Attending Physician  Scheduled Meds:  Scheduled Meds: . amLODipine  10 mg Oral Daily  . antiseptic oral rinse  7 mL Mouth Rinse QID  . cefTRIAXone (ROCEPHIN)  IV  2 g Intravenous Q24H  . chlorhexidine  15 mL Mouth Rinse BID  . feeding supplement (ENSURE COMPLETE)  237 mL Oral BID BM  . insulin aspart  0-15 Units Subcutaneous TID AC & HS  . insulin aspart  3 Units Subcutaneous TID WC  . insulin glargine  16 Units Subcutaneous QHS  . ipratropium-albuterol  3 mL Nebulization QID    Time spent on care of this patient: 35 mins   MCCLUNG,JEFFREY T , MD   [facility] Office  [phone] Pager - Text Page per Loretha StaplerAmion as per below:  On-Call/Text Page:      [email or web address]      password [email or web address]  If 7PM-7AM, please contact night-coverage [email or web address] Password [email or web address] 03/03/2014, 3:48 PM   LOS: 7 days

## 2014-03-04 ENCOUNTER — Encounter (HOSPITAL_COMMUNITY): Payer: Self-pay | Admitting: Physical Medicine and Rehabilitation

## 2014-03-04 LAB — CBC
HCT: 27.6 % — ABNORMAL LOW (ref 36.0–46.0)
HEMATOCRIT: 30 % — AB (ref 36.0–46.0)
Hemoglobin: 9.3 g/dL — ABNORMAL LOW (ref 12.0–15.0)
Hemoglobin: 10.1 g/dL — ABNORMAL LOW (ref 12.0–15.0)
MCH: 29.7 pg (ref 26.0–34.0)
MCH: 30.3 pg (ref 26.0–34.0)
MCHC: 33.7 g/dL (ref 30.0–36.0)
MCHC: 33.7 g/dL (ref 30.0–36.0)
MCV: 88.2 fL (ref 78.0–100.0)
MCV: 90.1 fL (ref 78.0–100.0)
PLATELETS: 381 10*3/uL (ref 150–400)
Platelets: 375 10*3/uL (ref 150–400)
RBC: 3.13 MIL/uL — ABNORMAL LOW (ref 3.87–5.11)
RBC: 3.33 MIL/uL — ABNORMAL LOW (ref 3.87–5.11)
RDW: 14.2 % (ref 11.5–15.5)
RDW: 14.1 % (ref 11.5–15.5)
WBC: 13 10*3/uL — ABNORMAL HIGH (ref 4.0–10.5)
WBC: 14 10*3/uL — AB (ref 4.0–10.5)

## 2014-03-04 LAB — GLUCOSE, CAPILLARY
Glucose-Capillary: 91 mg/dL (ref 70–99)
Glucose-Capillary: 149 mg/dL — ABNORMAL HIGH (ref 70–99)
Glucose-Capillary: 118 mg/dL — ABNORMAL HIGH (ref 70–99)

## 2014-03-04 LAB — RENAL FUNCTION PANEL
Albumin: 1.6 g/dL — ABNORMAL LOW (ref 3.5–5.2)
Anion gap: 9 (ref 5–15)
BUN: 88 mg/dL — ABNORMAL HIGH (ref 6–23)
CHLORIDE: 100 mmol/L (ref 96–112)
CO2: 31 mmol/L (ref 19–32)
CREATININE: 6.53 mg/dL — AB (ref 0.50–1.10)
Calcium: 7.8 mg/dL — ABNORMAL LOW (ref 8.4–10.5)
GFR calc Af Amer: 7 mL/min — ABNORMAL LOW (ref >90)
GFR calc non Af Amer: 6 mL/min — ABNORMAL LOW (ref >90)
Glucose, Bld: 107 mg/dL — ABNORMAL HIGH (ref 70–99)
PHOSPHORUS: 5.2 mg/dL — AB (ref 2.3–4.6)
POTASSIUM: 3.6 mmol/L (ref 3.5–5.1)
Sodium: 140 mmol/L (ref 135–145)

## 2014-03-04 LAB — IRON AND TIBC
Iron: 21 ug/dL — ABNORMAL LOW (ref 42–145)
Saturation Ratios: 10 % — ABNORMAL LOW (ref 20–55)
TIBC: 216 ug/dL — AB (ref 250–470)
UIBC: 195 ug/dL (ref 125–400)

## 2014-03-04 MED ORDER — CARVEDILOL 3.125 MG PO TABS
3.1250 mg | ORAL_TABLET | Freq: Two times a day (BID) | ORAL | Status: DC
  Administered 2014-03-04 – 2014-03-05 (×3): 3.125 mg via ORAL
  Filled 2014-03-04 (×7): qty 1

## 2014-03-04 MED ORDER — PHENOL 1.4 % MT LIQD
1.0000 | OROMUCOSAL | Status: DC | PRN
  Administered 2014-03-04: 1 via OROMUCOSAL
  Filled 2014-03-04: qty 177

## 2014-03-04 MED ORDER — LIVING WELL WITH DIABETES BOOK
Freq: Once | Status: AC
Start: 2014-03-04 — End: 2014-03-05
  Administered 2014-03-05: 08:00:00
  Filled 2014-03-04 (×3): qty 1

## 2014-03-04 MED ORDER — MENTHOL 3 MG MT LOZG
1.0000 | LOZENGE | OROMUCOSAL | Status: DC | PRN
  Administered 2014-03-04: 3 mg via ORAL
  Filled 2014-03-04: qty 9

## 2014-03-04 NOTE — Progress Notes (Signed)
Admission note:   Arrival Method: Transfer from 3S. Mental Status: A&OX3, flat affect noted. Telemetry: N/A  Skin: Did not assess at this time - patient eating dinner.  Tubes: N/A IV: RUA triple lumen PICC line. Pain: Denies.  Family: At bedside. Living Situation: From home. Safety Measures: Call bell within reach, bed alarm in place. 6E Orientation: Oriented to unit and surroundings.  Leanna BattlesEckelmann, Taunja Brickner Eileen, RN.

## 2014-03-04 NOTE — Evaluation (Signed)
Occupational Therapy Evaluation Patient Details Name: Jackie BannisterLula D Fisher MRN: [id number] DOB: 21-Dec-1955 Today's Date: 03/04/2014    History of Present Illness Patient is a 59 yo female admitted 02/24/14 to [facility] unresponsive.  Patient with DKA, hypoxic resp failure, asp pna bil., septic, AKI, acute encephalopathy and was intubated.  [facility].  Patient extubated 02/28/14.   Clinical Impression   Pt admitted with above. Pt independent with ADLs, PTA. Feel pt will benefit from acute OT to increase independence with BADLs and increase strength prior to d/c. Feel pt is good candidate for CIR.    Follow Up Recommendations  CIR    Equipment Recommendations  Other (comment) (tbd)    Recommendations for Other Services Rehab consult     Precautions / Restrictions Precautions Precautions: Fall Restrictions Weight Bearing Restrictions: No      Mobility Bed Mobility Overal bed mobility: Needs Assistance Bed Mobility: Supine to Sit;Sit to Supine     Supine to sit: Min assist Sit to supine: Min assist   General bed mobility comments: assist with trunk and to scoot hip out. cues for technique. Assist with LE when going to supine position. Cues and [facility]   Transfers Overall transfer level: Needs assistance Equipment used: Rolling walker (2 wheeled) Transfers: Sit to/from Stand Sit to Stand: Min guard;Min assist         General transfer comment: cues for hand placement. Performed a few transfers from elevated bed.          ADL Overall ADL's : Needs assistance/impaired     Grooming: Wash/dry face;Set up;Supervision/safety;Sitting (sitting EOB)       Lower Body Bathing: Sit to/from stand;Moderate assistance       Lower Body Dressing: Moderate assistance;Sit to/from stand   Toilet Transfer: Minimal assistance;Min guard;RW (sit to stand from elevated bed)             General ADL Comments: Pt performed a few sit <> stand  transfers to/from elevated bed. Pt became tired in session. Discussed CIR.  Pt stood from bed and washed off peri area/buttocks with washcloth.     Vision                     Perception     Praxis      Pertinent Vitals/Pain Pain Assessment: No/denies pain; Did see RR in 30's but trended down.     Hand Dominance     Extremity/Trunk Assessment Upper Extremity Assessment Upper Extremity Assessment: Generalized weakness   Lower Extremity Assessment Lower Extremity Assessment: Defer to PT evaluation       Communication Communication Communication: No difficulties   Cognition Arousal/Alertness: Awake/alert Behavior During Therapy: Flat affect Overall Cognitive Status:  (unsure of baseline; seemed to have slow processing and decreased short term memory)   Memory: Decreased short-term memory      General Comments       Exercises Exercises: Other exercises Other Exercises Other Exercises: AROM shoulder flexion supine in bed; approximately 10 reps on each arm   Shoulder Instructions      Home Living Family/patient expects to be discharged to:: Private residence Living Arrangements: Alone Available Help at Discharge: Family;Available 24 hours/day (brother) Type of Home: House Home Access: Level entry     Home Layout: One level     Bathroom Shower/Tub: Chief Strategy OfficerTub/shower unit   Bathroom Toilet: Standard     Home Equipment: None   Additional Comments: Patient reports her brother could stay  with her at discharge - needs to be verified.      Prior Functioning/Environment Level of Independence: Independent             OT Diagnosis: Generalized weakness   OT Problem List: Decreased strength;Decreased knowledge of use of DME or AE;Decreased knowledge of precautions;Decreased cognition;Decreased activity tolerance   OT Treatment/Interventions: Self-care/ADL training;DME and/or AE instruction;Therapeutic activities;Patient/family education;Balance  training;Cognitive remediation/compensation;Therapeutic exercise;Energy conservation    OT Goals(Current goals can be found in the care plan section) Acute Rehab OT Goals Patient Stated Goal: None stated OT Goal Formulation: With patient Time For Goal Achievement: 03/11/14 Potential to Achieve Goals: Good ADL Goals Pt Will Perform Lower Body Bathing: sit to/from stand;with min guard assist Pt Will Perform Lower Body Dressing: sit to/from stand;with min guard assist Pt Will Transfer to Toilet: with min guard assist;ambulating Pt Will Perform Toileting - Clothing Manipulation and hygiene: with supervision;sit to/from stand Additional ADL Goal #1: Pt will be independent with HEP for bilateral UE's to increase strength.  OT Frequency: Min 2X/week   Barriers to D/C:            Co-evaluation              End of Session Equipment Utilized During Treatment: Gait belt;Rolling walker  Activity Tolerance: Patient limited by fatigue Patient left: in bed;with call bell/phone within reach;with family/visitor present   Time: 1610-9604 OT Time Calculation (min): 21 min Charges:  OT General Charges $OT Visit: 1 Procedure OT Evaluation $Initial OT Evaluation Tier I: 1 Procedure G-CodesEarlie Raveling OTR/L [phone] 03/04/2014, 4:36 PM

## 2014-03-04 NOTE — Progress Notes (Signed)
Utilization review completed.  

## 2014-03-04 NOTE — Progress Notes (Signed)
Physical Therapy Treatment Patient Details Name: Jackie BannisterLula D Fisher MRN: [id number] DOB: 04-25-1955 Today's Date: 03/04/2014    History of Present Illness Patient is a 59 yo female admitted 02/24/14 to [facility] unresponsive.  Patient with DKA, hypoxic resp failure, asp pna bil., septic, AKI, acute encephalopathy and was intubated.  [facility]  Patient extubated 02/28/14.    PT Comments    Pt continues to need 2 person A for simply coming to stand and transfers.  At this time feel pt would benefit from CIR at D/C to maximize independence prior to returning to home.  Will continue to follow.    Follow Up Recommendations  CIR     Equipment Recommendations  Rolling walker with 5" wheels    Recommendations for Other Services Rehab consult     Precautions / Restrictions Precautions Precautions: Fall Restrictions Weight Bearing Restrictions: No    Mobility  Bed Mobility               General bed mobility comments: pt sitting in recliner.    Transfers Overall transfer level: Needs assistance Equipment used: Rolling walker (2 wheeled) Transfers: Sit to/from Stand Sit to Stand: Mod assist;+2 physical assistance         General transfer comment: cues for UE use and controlling descent to sitting.  pt repeated coming to stand x3 with longest time standing ~30 seconds before needing to sit 2/2 fatigue.    Ambulation/Gait                 Stairs            Wheelchair Mobility    Modified Rankin (Stroke Patients Only)       Balance Overall balance assessment: Needs assistance         Standing balance support: Bilateral upper extremity supported;During functional activity Standing balance-Leahy Scale: Poor Standing balance comment: Relies on RW and MinA to maintain standing.                      Cognition Arousal/Alertness: Awake/alert Behavior During Therapy: Flat affect Overall Cognitive Status: Impaired/Different from  baseline Area of Impairment: Orientation;Memory;Awareness;Problem solving Orientation Level: Disoriented to;Situation;Time   Memory: Decreased short-term memory     Awareness: Emergent Problem Solving: Slow processing;Difficulty sequencing;Requires verbal cues;Requires tactile cues      Exercises      General Comments        Pertinent Vitals/Pain Pain Assessment: No/denies pain    Home Living                      Prior Function            PT Goals (current goals can now be found in the care plan section) Acute Rehab PT Goals Patient Stated Goal: None stated PT Goal Formulation: With patient Time For Goal Achievement: 03/08/14 Potential to Achieve Goals: Good Progress towards PT goals: Progressing toward goals    Frequency  Min 3X/week    PT Plan Discharge plan needs to be updated    Co-evaluation             End of Session Equipment Utilized During Treatment: Gait belt Activity Tolerance: Patient limited by fatigue Patient left: in chair;with call bell/phone within reach     Time: [id number] PT Time Calculation (min) (ACUTE ONLY): 12 min  Charges:  $Therapeutic Activity: 8-22 mins  G CodesSunny Schlein, [place] [phone] 03/04/2014, 2:00 PM

## 2014-03-04 NOTE — Progress Notes (Signed)
Subjective: Interval History: has no complaint, did not know had DM.  Objective: Vital signs in last 24 hours: Temp:  [98.1 F (36.7 C)-99 F (37.2 C)] 98.5 F (36.9 C) (01/25 0700) Pulse Rate:  [76-87] 76 (01/25 0330) Resp:  [18-23] 21 (01/25 0330) BP: (147-165)/(57-65) 157/65 mmHg (01/25 0330) SpO2:  [94 %-97 %] 97 % (01/25 0330) Weight:  [85.5 kg (188 lb 7.9 oz)] 85.5 kg (188 lb 7.9 oz) (01/25 0455) Weight change: 1.3 kg (2 lb 13.9 oz)  Intake/Output from previous day: 01/24 0701 - 01/25 0700 In: 1310.8 [P.O.:360; I.V.:850.8; IV Piggyback:50] Out: 1550 [Urine:1550] Intake/Output this shift:    General appearance: alert and slowed mentation Resp: diminished breath sounds bilaterally and rales bibasilar Cardio: systolic murmur: holosystolic 2/6, blowing at apex GI: mild obese, pos bs, liver down 5 cm Extremities: edema 3+  Lab Results:  Recent Labs  03/03/14 0400 03/04/14 0500  WBC 12.0* 13.0*  HGB 9.0* 9.3*  HCT 26.0* 27.6*  PLT 329 375   BMET:  Recent Labs  03/03/14 0400 03/04/14 0500  NA 139 140  K 3.3* 3.6  CL 98 100  CO2 30 31  GLUCOSE 160* 107*  BUN 93* 88*  CREATININE 6.53* 6.53*  CALCIUM 7.9* 7.8*   No results for input(s): PTH in the last 72 hours. Iron Studies: No results for input(s): IRON, TIBC, TRANSFERRIN, FERRITIN in the last 72 hours.  Studies/Results: No results found.  I have reviewed the patient's current medications.  Assessment/Plan: 1 AKI  Some CKD.  ATN vs contrast, Cr plateau.  Vol xs, acid/base/k ok.  Allow to diurese.  Will see if recovers. Can take out foley 2Pneu on AB 3 DM controlled 4 HTn on Amlod 5 Anemia follow 6 HPTH check 7 Sepsis resolved P stop ivf, d/c foley, allow to diurese, follow chem, AB    LOS: 8 days   Iban Utz L 03/04/2014,8:37 AM

## 2014-03-04 NOTE — Consult Note (Signed)
Physical Medicine and Rehabilitation Consult  Reason for Consult: Critical illness myopathy Referring Physician: Dr. Sharon Seller   HPI: Jackie Fisher is a 59 y.o. female with no prior health history who was found unresponsive at home on 02/24/14 with hypothermia, acute renal failure, bilateral strep pneumo PNA and diabetic ketoacidosis. She was started on IVF as well as antibiotics for sepsis and required intubation in ED due to respiratory distress. She continued to have worsening of renal status with non gap acidosis as well as drop in Hgb to 8.9 and drop in platelets to 137 therefore was transferred to [facility] on 01/20 for further medical management. She tolerated extubation on 01/21 and nephrology consulted for input on renal status. Serologic workup negative and renal ultrasound unremarkable. Dr. Eliott Nine  felt that patient likely with underlying CKD with ATN due to sepsis. UOP improving on IVF/bicarb drip and mentation slowly improving.  She has completed her antibiotic regimen and antibiotic associated diarrhea is resolving. Therapy evaluation done today and patient noted to be deconditioned. CIR recommended for follow up therapy.     Review of Systems  HENT: Negative for hearing loss.   Eyes: Negative for blurred vision and double vision.  Respiratory: Negative for cough and shortness of breath.   Cardiovascular: Negative for chest pain and palpitations.  Gastrointestinal: Negative for heartburn, nausea and vomiting.  Genitourinary: Negative for dysuria and urgency.  Musculoskeletal: Negative for myalgias and back pain.  Neurological: Negative for dizziness and headaches.      Past Medical History  Diagnosis Date  . Hypertension   . Diabetes mellitus without complication     Past Surgical History  Procedure Laterality Date  . Abdominal hysterectomy      Family History  Problem Relation Age of Onset  . Family history unknown: Yes    Social History:  Lives alone. (Brother  lives next door)Works part time at [employer]. She reports that she quit smoking 15 years ago. She does not have any smokeless tobacco history on file. She reports that she does not drink alcohol or use illicit drugs.    Allergies: No Known Allergies    No prescriptions prior to admission    Home: Home Living Family/patient expects to be discharged to:: Private residence Living Arrangements: Alone Available Help at Discharge: Family, Available 24 hours/day (brother) Type of Home: House Home Access: Level entry Home Layout: One level Home Equipment: None Additional Comments: Patient reports her brother could stay with her at discharge - needs to be verified.  Functional History: Prior Function Level of Independence: Independent Comments: Information from patient - will need to veryify. Functional Status:  Mobility: Bed Mobility Overal bed mobility: Needs Assistance, +2 for physical assistance Bed Mobility: Supine to Sit, Sit to Supine Supine to sit: Min assist Sit to supine: Min assist General bed mobility comments: assist with trunk. cues for technique. Assist with LE when going to supine position. Cues and assist to scoot to [facility].  Transfers Overall transfer level: Needs assistance Equipment used: Rolling walker (2 wheeled) Transfers: Sit to/from Stand Sit to Stand: Min guard, Min assist General transfer comment: cues for hand placement. Performed a few transfers from elevated bed.       ADL: ADL Overall ADL's : Needs assistance/impaired Grooming: Wash/dry face, Set up, Supervision/safety, Sitting (sitting EOB) Lower Body Bathing: Sit to/from stand, Moderate assistance Lower Body Dressing: Moderate assistance, Sit to/from stand Toilet Transfer: Minimal assistance, Min guard, RW (sit to stand from elevated bed) General ADL  Comments: Pt performed a few sit <> stand transfers to/from elevated bed. Pt became tired in session. Discussed CIR.   Cognition: Cognition Overall  Cognitive Status:  (unsure of baseline; seemed to have slow processing) Orientation Level: Oriented X4 Cognition Arousal/Alertness: Awake/alert Behavior During Therapy: Flat affect Overall Cognitive Status:  (unsure of baseline; seemed to have slow processing) Area of Impairment: Orientation, Memory, Awareness, Problem solving Orientation Level: Disoriented to, Situation, Time Memory: Decreased short-term memory Awareness: Emergent Problem Solving: Slow processing, Difficulty sequencing, Requires verbal cues, Requires tactile cues  Blood pressure 153/56, pulse 80, temperature 97.8 F (36.6 C), temperature source Oral, resp. rate 22, height  (1.702 m), weight 85.5 kg (188 lb 7.9 oz), SpO2 95 %. Physical Exam  Nursing note and vitals reviewed. Constitutional: She is oriented to person, place, and time. She appears well-developed and well-nourished.  HENT:  Head: Normocephalic and atraumatic.  Eyes: Conjunctivae are normal. Pupils are equal, round, and reactive to light.  Neck: Normal range of motion. Neck supple.  Cardiovascular: Normal rate and regular rhythm.   Respiratory: Breath sounds normal. No respiratory distress. She has no wheezes.  GI: Soft. Bowel sounds are normal. She exhibits no distension. There is no tenderness.  Musculoskeletal: She exhibits no edema or tenderness.  Neurological: She is alert and oriented to person, place, and time.  Speech clear. Follows basic commands without difficulty. BLE weakness noted L>R.   Skin: Skin is warm and dry.    Results for orders placed or performed during the hospital encounter of 02/24/14 (from the past 24 hour(s))  Glucose, capillary     Status: Abnormal   Collection Time: 03/03/14  9:29 PM  Result Value Ref Range   Glucose-Capillary 193 (H) 70 - 99 mg/dL  Renal function panel     Status: Abnormal   Collection Time: 03/04/14  5:00 AM  Result Value Ref Range   Sodium 140 135 - 145 mmol/L   Potassium 3.6 3.5 - 5.1 mmol/L     Chloride 100 96 - 112 mmol/L   CO2 31 19 - 32 mmol/L   Glucose, Bld 107 (H) 70 - 99 mg/dL   BUN 88 (H) 6 - 23 mg/dL   Creatinine, Ser 6.57 (H) 0.50 - 1.10 mg/dL   Calcium 7.8 (L) 8.4 - 10.5 mg/dL   Phosphorus 5.2 (H) 2.3 - 4.6 mg/dL   Albumin 1.6 (L) 3.5 - 5.2 g/dL   GFR calc non Af Amer 6 (L) >90 mL/min   GFR calc Af Amer 7 (L) >90 mL/min   Anion gap 9 5 - 15  CBC     Status: Abnormal   Collection Time: 03/04/14  5:00 AM  Result Value Ref Range   WBC 13.0 (H) 4.0 - 10.5 K/uL   RBC 3.13 (L) 3.87 - 5.11 MIL/uL   Hemoglobin 9.3 (L) 12.0 - 15.0 g/dL   HCT 84.6 (L) 96.2 - 95.2 %   MCV 88.2 78.0 - 100.0 fL   MCH 29.7 26.0 - 34.0 pg   MCHC 33.7 30.0 - 36.0 g/dL   RDW 84.1 32.4 - 40.1 %   Platelets 375 150 - 400 K/uL  Glucose, capillary     Status: None   Collection Time: 03/04/14  7:41 AM  Result Value Ref Range   Glucose-Capillary 91 70 - 99 mg/dL   Comment 1 Documented in Chart    Comment 2 Notify RN   Iron and TIBC     Status: Abnormal   Collection Time: 03/04/14  9:00 AM  Result Value Ref Range   Iron 21 (L) 42 - 145 ug/dL   TIBC 161216 (L) 096250 - 045470 ug/dL   Saturation Ratios 10 (L) 20 - 55 %   UIBC 195 125 - 400 ug/dL  CBC     Status: Abnormal   Collection Time: 03/04/14  9:00 AM  Result Value Ref Range   WBC 14.0 (H) 4.0 - 10.5 K/uL   RBC 3.33 (L) 3.87 - 5.11 MIL/uL   Hemoglobin 10.1 (L) 12.0 - 15.0 g/dL   HCT 40.930.0 (L) 81.136.0 - 91.446.0 %   MCV 90.1 78.0 - 100.0 fL   MCH 30.3 26.0 - 34.0 pg   MCHC 33.7 30.0 - 36.0 g/dL   RDW 78.214.1 95.611.5 - 21.315.5 %   Platelets 381 150 - 400 K/uL  Glucose, capillary     Status: Abnormal   Collection Time: 03/04/14 11:29 AM  Result Value Ref Range   Glucose-Capillary 149 (H) 70 - 99 mg/dL   Comment 1 Documented in Chart    Comment 2 Notify RN    No results found.  Assessment/Plan: Diagnosis: deconditioning 1. Does the need for close, 24 hr/day medical supervision in concert with the patient's rehab needs make it unreasonable for this  patient to be served in a less intensive setting? No 2. Co-Morbidities requiring supervision/potential complications:   3. Due to bladder management, bowel management, safety and disease management, does the patient require 24 hr/day rehab nursing? No 4. Does the patient require coordinated care of a physician, rehab nurse, PT, OT to address physical and functional deficits in the context of the above medical diagnosis(es)? No Addressing deficits in the following areas: balance, endurance, locomotion, strength and transferring 5. Can the patient actively participate in an intensive therapy program of at least 3 hrs of therapy per day at least 5 days per week? No 6. The potential for patient to make measurable gains while on inpatient rehab is fair 7. Anticipated functional outcomes upon discharge from inpatient rehab are n/a  with PT, n/a with OT, n/a with SLP. 8. Estimated rehab length of stay to reach the above functional goals is: n/a 9. Does the patient have adequate social supports and living environment to accommodate these discharge functional goals? Potentially 10. Anticipated D/C setting: Home 11. Anticipated post D/C treatments: HH therapy 12. Overall Rehab/Functional Prognosis: excellent  RECOMMENDATIONS: This patient's condition is appropriate for continued rehabilitative care in the following setting: Home Health when medically ready for discharge Patient has agreed to participate in recommended program. Potentially Note that insurance prior authorization may be required for reimbursement for recommended care.  Comment:    Ranelle OysterZachary T. Dwayne Begay, MD, [facility] HospitalFAAPMR [facility] Physical Medicine & Rehabilitation 03/05/2014'     03/04/2014

## 2014-03-04 NOTE — Progress Notes (Signed)
Rehab Admissions Coordinator Note:  Patient was screened by Clois DupesBoyette, [place] Dube Godwin for appropriateness for an Inpatient Acute Rehab Consult per PT recommendations where pt is not progressing functionally.  At this time, we are recommending Inpatient Rehab consult. I will contact Dr. Sharon SellerMcclung for order.  Clois DupesBoyette, Donnita Farina Godwin 03/04/2014, 2:08 PM  I can be reached at [phone].

## 2014-03-04 NOTE — Progress Notes (Signed)
Patient to transfer to 6E30 report given to receiving RN Cybill.  All questions answered at this time.  Pt. VSS with no s/s of distress noted.  Patient stable at transfer.

## 2014-03-04 NOTE — Progress Notes (Signed)
[place] TEAM 1 - Stepdown/ICU TEAM Progress Note  Jackie Fisher YNW:[id number] DOB: 1955-12-09 DOA: 02/24/2014 PCP: No primary care provider on file.  Admit HPI / Brief Narrative: 59yo F who presented with DKA 1/17 to [facility] after being found unresponsive at home by her family. Found to be in AKI, septic, and in hypoxic resp failure due to aspiration pna. Profoundly acidotic despite bicarb gtt. Was taken off insulin gtt and got hyperglycemic so was put back on it. Still having non gap acidosis despite being on bicarb drip. Sputum cx growing strep pneumo. Intubated.  CT head normal.   Significant Events: 1/17 - admitted. DKA, severe sepsis. Started on vanc+zosyn. and insulin gtt, DKA protocol. Intubated for AMS 1/18 - 1/19 Gap closed but had hyperglycemia off insulin gtt so put back on it. Continued to have non gap acidosis. White count trending down. hgb dropped from 11.8 to 8-9 plt dropped from 323 --> 137. HIIT panel sent.  1/21 Extubated  HPI/Subjective: Pt is smiling and in good spirits. She is much more interactive today.  She denies focal complaints, and is motivated to get up and moving.    Assessment/Plan:  Anion gap metabolic acidosis Due to ARF - now off bicarb - to stop IVF today - follow   DKA - Uncontrolled ?newly diagnosed DM A1c 13.2 - ?newly diagnosed DM - little is known about her past med hx - CBG well controlled - follow w/o change in tx plan today  Bil Strep pneumo Pneumonia Has now completed 9 days of abx coverage - clinically stabilizing - stop abx and follow   Acute Kidney failure Unknown baseline crt - crt 1.8 at initial presentation - probable ATN - Nephrology is following - crt appears to have plateaud   Thrombocytopenia HIT panel negative - plts have now normalized/recovered   Hypokalemia  Replaced  Anemia Hgb stable/improving   Diarrhea   C diff negative - trial of immodium helping   HTN BP remains elevated - adjust tx and follow    Code Status: FULL Family Communication: spoke w/ sister at bedside  Disposition Plan: transfer to renal bed - ?eventual CIR   Consultants: Nephrology PCCM  Antibiotics: Rocephin 1/21 > 1/25 Zosyn 1/17 > 1/20 Vanc 1/17 > 1/20  DVT prophylaxis: SCDs  Objective: Blood pressure 153/56, pulse 80, temperature 97.8 F (36.6 C), temperature source Oral, resp. rate 22, height 5\' 7"  (1.702 m), weight 85.5 kg (188 lb 7.9 oz), SpO2 95 %.  Intake/Output Summary (Last 24 hours) at 03/04/14 1453 Last data filed at 03/04/14 1033  Gross per 24 hour  Intake 1185.83 ml  Output   1651 ml  Net -465.17 ml   Exam: General: No acute respiratory distress - alert and conversant  Lungs: Clear to auscultation bilaterally without wheezes or crackles Cardiovascular: Regular rate and rhythm without murmur gallop or rub Abdomen: Nontender, nondistended, soft, bowel sounds positive, no rebound, no ascites, no appreciable mass Extremities: No significant cyanosis, clubbing, edema bilateral lower extremities  Data Reviewed: Basic Metabolic Panel:  Recent Labs Lab 02/28/14 0520 03/01/14 0712 03/02/14 0450 03/03/14 0400 03/04/14 0500  NA 134* 137 139 139 140  K 3.3* 3.5 3.4* 3.3* 3.6  CL 103 101 99 98 100  CO2 19 19 19 30 31   GLUCOSE 154* 209* 298* 160* 107*  BUN 70* 89* 94* 93* 88*  CREATININE 4.91* 5.68* 6.44* 6.53* 6.53*  CALCIUM 8.0* 8.4 8.2* 7.9* 7.8*  PHOS  --  3.9 4.4 4.0  5.2*    Liver Function Tests:  Recent Labs Lab 02/26/14 0545 02/28/14 0520 03/01/14 0712 03/02/14 0450 03/03/14 0400 03/04/14 0500  AST 21 16  --   --   --   --   ALT 19 14  --   --   --   --   ALKPHOS 151* 279*  --   --   --   --   BILITOT 0.6 0.6  --   --   --   --   PROT 5.6* 5.0*  --   --   --   --   ALBUMIN 1.7* 1.3* 1.5* 1.4* 1.5* 1.6*   CBC:  Recent Labs Lab 03/01/14 0712 03/02/14 0450 03/03/14 0400 03/04/14 0500 03/04/14 0900  WBC 12.8* 11.2* 12.0* 13.0* 14.0*  HGB 9.4* 9.1* 9.0* 9.3*  10.1*  HCT 26.8* 26.5* 26.0* 27.6* 30.0*  MCV 88.2 86.9 86.7 88.2 90.1  PLT 218 281 329 375 381   CBG:  Recent Labs Lab 03/03/14 1158 03/03/14 1641 03/03/14 2129 03/04/14 0741 03/04/14 1129  GLUCAP 167* 127* 193* 91 149*    Recent Results (from the past 240 hour(s))  MRSA PCR Screening     Status: None   Collection Time: 02/24/14  4:13 PM  Result Value Ref Range Status   MRSA by PCR NEGATIVE NEGATIVE Final    Comment:        The GeneXpert MRSA Assay (FDA approved for NASAL specimens only), is one component of a comprehensive MRSA colonization surveillance program. It is not intended to diagnose MRSA infection nor to guide or monitor treatment for MRSA infections.   Culture, blood (routine x 2) Call MD if unable to obtain prior to antibiotics being given     Status: None   Collection Time: 02/24/14  6:09 PM  Result Value Ref Range Status   Specimen Description BLOOD RIGHT HAND  Final   Special Requests BOTTLES DRAWN AEROBIC AND ANAEROBIC 6CC  Final   Culture NO GROWTH 5 DAYS  Final   Report Status 03/01/2014 FINAL  Final  Culture, blood (routine x 2) Call MD if unable to obtain prior to antibiotics being given     Status: None   Collection Time: 02/24/14  6:25 PM  Result Value Ref Range Status   Specimen Description BLOOD PICC LINE DRAWN BY RN  Final   Special Requests BOTTLES DRAWN AEROBIC AND ANAEROBIC 6CC  Final   Culture NO GROWTH 5 DAYS  Final   Report Status 03/01/2014 FINAL  Final  Culture, respiratory (NON-Expectorated)     Status: None   Collection Time: 02/24/14 11:35 PM  Result Value Ref Range Status   Specimen Description SPUTUM  Final   Special Requests NONE  Final   Gram Stain   Final    ABUNDANT WBC PRESENT, PREDOMINANTLY PMN NO SQUAMOUS EPITHELIAL CELLS SEEN ABUNDANT GRAM POSITIVE COCCI IN PAIRS Performed at [facility]    Culture   Final    FEW STREPTOCOCCUS PNEUMONIAE Performed at [facility]    Report Status 02/28/2014  FINAL  Final   Organism ID, Bacteria STREPTOCOCCUS PNEUMONIAE  Final      Susceptibility   Streptococcus pneumoniae - MIC (ETEST)*    CEFTRIAXONE 0.012 SENSITIVE Sensitive     LEVOFLOXACIN 0.50 SENSITIVE Sensitive     PENICILLIN 0.016 SENSITIVE Sensitive     * FEW STREPTOCOCCUS PNEUMONIAE  MRSA PCR Screening     Status: None   Collection Time: 02/27/14  6:27 PM  Result Value Ref Range Status   MRSA by PCR NEGATIVE NEGATIVE Final    Comment:        The GeneXpert MRSA Assay (FDA approved for NASAL specimens only), is one component of a comprehensive MRSA colonization surveillance program. It is not intended to diagnose MRSA infection nor to guide or monitor treatment for MRSA infections.   Clostridium Difficile by PCR     Status: None   Collection Time: 03/02/14  7:11 AM  Result Value Ref Range Status   C difficile by pcr NEGATIVE NEGATIVE Final     Studies:  Recent x-ray studies have been reviewed in detail by the Attending Physician  Scheduled Meds:  Scheduled Meds: . amLODipine  10 mg Oral Daily  . antiseptic oral rinse  7 mL Mouth Rinse QID  . cefTRIAXone (ROCEPHIN)  IV  2 g Intravenous Q24H  . chlorhexidine  15 mL Mouth Rinse BID  . insulin aspart  0-15 Units Subcutaneous TID AC & HS  . insulin aspart  3 Units Subcutaneous TID WC  . insulin glargine  16 Units Subcutaneous QHS    Time spent on care of this patient: 35 mins   Zoey Bidwell T , MD   [facility] Office  [phone] Pager - Text Page per Loretha Stapler as per below:  On-Call/Text Page:      [email or web address]      password [email or web address]  If 7PM-7AM, please contact night-coverage [email or web address] Password [email or web address] 03/04/2014, 2:53 PM   LOS: 8 days

## 2014-03-05 ENCOUNTER — Encounter (HOSPITAL_COMMUNITY): Payer: Self-pay | Admitting: Student

## 2014-03-05 LAB — CBC
HCT: 28.4 % — ABNORMAL LOW (ref 36.0–46.0)
Hemoglobin: 9.7 g/dL — ABNORMAL LOW (ref 12.0–15.0)
MCH: 31.2 pg (ref 26.0–34.0)
MCHC: 34.2 g/dL (ref 30.0–36.0)
MCV: 91.3 fL (ref 78.0–100.0)
PLATELETS: 382 10*3/uL (ref 150–400)
RBC: 3.11 MIL/uL — AB (ref 3.87–5.11)
RDW: 14.3 % (ref 11.5–15.5)
WBC: 11.1 10*3/uL — ABNORMAL HIGH (ref 4.0–10.5)

## 2014-03-05 LAB — PHOSPHORUS: Phosphorus: 6.2 mg/dL — ABNORMAL HIGH (ref 2.3–4.6)

## 2014-03-05 LAB — COMPREHENSIVE METABOLIC PANEL
ALT: 14 U/L (ref 0–35)
AST: 19 U/L (ref 0–37)
Albumin: 1.8 g/dL — ABNORMAL LOW (ref 3.5–5.2)
Alkaline Phosphatase: 153 U/L — ABNORMAL HIGH (ref 39–117)
Anion gap: 16 — ABNORMAL HIGH (ref 5–15)
BUN: 90 mg/dL — AB (ref 6–23)
CO2: 23 mmol/L (ref 19–32)
CREATININE: 6.97 mg/dL — AB (ref 0.50–1.10)
Calcium: 7.9 mg/dL — ABNORMAL LOW (ref 8.4–10.5)
Chloride: 100 mmol/L (ref 96–112)
GFR, EST AFRICAN AMERICAN: 7 mL/min — AB (ref >90)
GFR, EST NON AFRICAN AMERICAN: 6 mL/min — AB (ref >90)
GLUCOSE: 288 mg/dL — AB (ref 70–99)
Potassium: 3.5 mmol/L (ref 3.5–5.1)
SODIUM: 139 mmol/L (ref 135–145)
TOTAL PROTEIN: 5.4 g/dL — AB (ref 6.0–8.3)
Total Bilirubin: 0.5 mg/dL (ref 0.3–1.2)

## 2014-03-05 LAB — PARATHYROID HORMONE, INTACT (NO CA): PTH: 106 pg/mL — ABNORMAL HIGH (ref 15–65)

## 2014-03-05 LAB — GLUCOSE, CAPILLARY
GLUCOSE-CAPILLARY: 197 mg/dL — AB (ref 70–99)
GLUCOSE-CAPILLARY: 139 mg/dL — AB (ref 70–99)
Glucose-Capillary: 175 mg/dL — ABNORMAL HIGH (ref 70–99)
Glucose-Capillary: 109 mg/dL — ABNORMAL HIGH (ref 70–99)
Glucose-Capillary: 162 mg/dL — ABNORMAL HIGH (ref 70–99)

## 2014-03-05 MED ORDER — SODIUM CHLORIDE 0.9 % IV BOLUS (SEPSIS)
500.0000 mL | Freq: Once | INTRAVENOUS | Status: AC
  Administered 2014-03-05: 500 mL via INTRAVENOUS

## 2014-03-05 MED ORDER — CALCITRIOL 0.25 MCG PO CAPS
0.2500 ug | ORAL_CAPSULE | Freq: Every day | ORAL | Status: DC
  Administered 2014-03-05 – 2014-03-15 (×11): 0.25 ug via ORAL
  Filled 2014-03-05 (×12): qty 1

## 2014-03-05 MED ORDER — INSULIN ASPART PROT & ASPART (70-30 MIX) 100 UNIT/ML ~~LOC~~ SUSP
12.0000 [IU] | Freq: Two times a day (BID) | SUBCUTANEOUS | Status: DC
  Administered 2014-03-05 – 2014-03-07 (×4): 12 [IU] via SUBCUTANEOUS
  Filled 2014-03-05: qty 10

## 2014-03-05 MED ORDER — SODIUM CHLORIDE 0.9 % IV SOLN
1020.0000 mg | Freq: Once | INTRAVENOUS | Status: AC
  Administered 2014-03-05: 1020 mg via INTRAVENOUS
  Filled 2014-03-05: qty 34

## 2014-03-05 MED ORDER — INSULIN STARTER KIT- SYRINGES (ENGLISH)
1.0000 | Freq: Once | Status: AC
  Administered 2014-03-05: 1
  Filled 2014-03-05: qty 1

## 2014-03-05 MED ORDER — INSULIN GLARGINE 100 UNIT/ML ~~LOC~~ SOLN
20.0000 [IU] | Freq: Every day | SUBCUTANEOUS | Status: DC
  Filled 2014-03-05: qty 0.2

## 2014-03-05 NOTE — Progress Notes (Signed)
  RD consulted for nutrition education regarding diabetes.   Lab Results  Component Value Date   HGBA1C 13.2* 02/24/2014    RD provided "Carbohydrate Counting for People with Diabetes" handout from the Academy of Nutrition and Dietetics and "Plate Method" Handout. Discussed different food groups and their effects on blood sugar, emphasizing carbohydrate-containing foods. Provided list of carbohydrates and recommended serving sizes of common foods.    Discussed importance of controlled and consistent carbohydrate intake throughout the day. Provided examples of ways to balance meals/snacks and encouraged intake of high-fiber, whole grain complex carbohydrates. Teach back method used. Pt reports she mainly eats meat, and will try to eat her meat then vegetables and save the starchy foods for last.   Pt expressed she understood and would call if she had questions. Expect good compliance.  Body mass index is 29.94 kg/(m^2). Pt meets criteria for Overweight based on current BMI.  Current diet order is CHO Modifed, patient is consuming approximately 25-75% of meals at this time. Pt reports she does not like the food.   Magdalen SpatzLauren Deundra Bard MS Dietetic Intern Pager Number [phone]

## 2014-03-05 NOTE — Care Management Note (Signed)
CARE MANAGEMENT NOTE 03/05/2014  Patient:  GIZELL, DANSER   Account Number:  [id number]  Date Initiated:  02/25/2014  Documentation initiated by:  CHILDRESS,JESSICA  Subjective/Objective Assessment:   Pt found unresponsive at home. Information obtained from brother. Pt lives alone independently. Pt has not been to MD in years but has been to [facility] in the past.     Action/Plan:   Pt is intubated and unconsious at this time. Will continue to follow for CM needs.  03/05/2014 Pt alert and able to work with PT and OT, met with pt and discussed [facility] she has transportation , appointment scheduled.   Anticipated DC Date:  03/07/2014   Anticipated DC Plan:  [place]  CM consult      Choice offered to / List presented to:             Status of service:  In process, will continue to follow Medicare Important Message given?   (If response is "NO", the following Medicare IM given date fields will be blank) Date Medicare IM given:   Medicare IM given by:   Date Additional Medicare IM given:   Additional Medicare IM given by:    Discharge Disposition:    Per UR Regulation:    If discussed at Long Length of Stay Meetings, dates discussed:   03/05/2014    Comments:  02/25/2014 1500 Jolene Provost, RN, MSN, [facility] 03/05/2014 Met with pt who varified that she is uninsured, she does have transportation and is interested in the [employer] in [place]. Appointment scheduled and info placed in pt chart, appointment is for Wed, Mar 13, 2014 at 10am, pt will be given a list of items to bring in order to be eval for adm to the clinic.  This infor was placed in the front of the pt chart to be given to the pt at the time of d/c. Will follow for possible [facility] services for disease management at d/c. CIR following , however pt appears to be improving and may not qualify for CIR. CRoyal RN MPH, case manager,  [phone]   03/01/14  12noon.  Luz Lex, RNBSN [phone] Patient states has no insurance - cannot afford diabetic meds.   patient from [facility].  Tried to talk to patient. Briefly opened eyes - only answering in grunts.  Did nod negative to not having a PCP.  Once more alert will need to try to set her up with resources closer to home.  Can get into the [facility] here in [place] but would probably not want to do that due to distance.  CM will continue to follow.  02-28-14 3:15pm Luz Lex, RNBSN [phone] Tx to [facility] on 1-20 due to continued acidosis and vent. Extubated today.

## 2014-03-05 NOTE — Progress Notes (Signed)
Subjective: Interval History: has no complaint .  Objective: Vital signs in last 24 hours: Temp:  [97.8 F (36.6 C)-99.4 F (37.4 C)] 99.4 F (37.4 C) (01/26 0628) Pulse Rate:  [74-80] 77 (01/26 0928) Resp:  [20-22] 20 (01/26 0628) BP: (152-166)/(56-65) 166/65 mmHg (01/26 0928) SpO2:  [93 %-96 %] 96 % (01/26 0628) Weight:  [86.72 kg (191 lb 2.9 oz)] 86.72 kg (191 lb 2.9 oz) (01/25 2055) Weight change: 1.22 kg (2 lb 11 oz)  Intake/Output from previous day: 01/25 0701 - 01/26 0700 In: -  Out: 801 [Urine:800; Stool:1] Intake/Output this shift:    General appearance: alert, cooperative and moderately obese Resp: clear to auscultation bilaterally Cardio: S1, S2 normal and systolic murmur: systolic ejection 2/6, decrescendo at 2nd left intercostal space GI: obese, pos bs, liver down, 5 cm Extremities: edema 2+  Lab Results:  Recent Labs  03/04/14 0900 03/05/14 0523  WBC 14.0* 11.1*  HGB 10.1* 9.7*  HCT 30.0* 28.4*  PLT 381 382   BMET:  Recent Labs  03/04/14 0500 03/05/14 0523  NA 140 139  K 3.6 3.5  CL 100 100  CO2 31 23  GLUCOSE 107* 288*  BUN 88* 90*  CREATININE 6.53* 6.97*  CALCIUM 7.8* 7.9*    Recent Labs  03/04/14 0900  PTH 106*   Iron Studies:  Recent Labs  03/04/14 0900  IRON 21*  TIBC 216*    Studies/Results: No results found.  I have reviewed the patient's current medications.  Assessment/Plan: 1  AKI nonoliguric??, needs better I&O.  Cr ^ mild, not uremic .  K and acid/base ok. 2 HTN controlled 3 DM edu, control underway 4 Debill 5 Anemia low sat  Bolus Fe 6 HPTH start Vit D 7 Obesity P IV Fe, follow chem, need to get PICC out.    LOS: 9 days   Keyandra Swenson L 03/05/2014,10:27 AM

## 2014-03-05 NOTE — Progress Notes (Signed)
I met with pt at bedside. Pt was minguard assist with OT for transfers yesterday. Likely can progress to go home with [facility] at d/c. I would like to see increased sitting in chair and therapy followup. I encouraged pt to ask for assistance to [facility] and to sit in chair with assist. I will follow up tomorrow, but likely can go home with [facility]. [phone]

## 2014-03-05 NOTE — Progress Notes (Signed)
NUTRITION FOLLOW UP  Intervention:   -Continue Glucerna TID PRN -RD to monitor PO intake -Diabetic Education   Nutrition Dx:   Food and nutrition related knowledge deficit related to new onset DM diagnosis as evidenced by elevated glucose levels and HbA1c of 13.4.  Goal:   Pt to meet >/= 90% of needs through meals and supplements  Monitor:   Pt PO intake, weight and labs  Assessment:   Pt found unresponsive on the floor of her home. Came in with DKA, found to be in AKI, septic, hypoxic, resp failure, aspiration pna. Intubated at [facility], transferred to [facility] on 1/20.  Pt extubated on 1/21.   Current diet order of CHO Modified.  New diagnosis of DM.  25-75% PO intake.  Pt reports she is eating well, she has an appetite but doesn't like the food.  She has not been drinking the Glucerna shakes.   Height: Ht Readings from Last 1 Encounters:  03/04/14 _0  (1.702 m)    Weight Status:   Wt Readings from Last 1 Encounters:  03/04/14 191 lb 2.9 oz (86.72 kg)  02/28/14 180 lb 5.4 oz (81.8 kg) 02/25/14 163 lb 5.8 oz (74.1 kg)  Re-estimated needs:  Kcal: 1600-1800 Protein: 80-95 gm Fluid: 1.6-1.8 L  Skin: MSAD mid upper buttocks  Diet Order: Diet Carb Modified   Intake/Output Summary (Last 24 hours) at 03/05/14 1220 Last data filed at 03/05/14 1000  Gross per 24 hour  Intake    120 ml  Output    250 ml  Net   -130 ml    Last BM: 1/26   Labs:   Recent Labs Lab 03/03/14 0400 03/04/14 0500 03/05/14 0523  NA 139 140 139  K 3.3* 3.6 3.5  CL 98 100 100  CO2 _1 BUN 93* 88* 90*  CREATININE 6.53* 6.53* 6.97*  CALCIUM 7.9* 7.8* 7.9*  PHOS 4.0 5.2* 6.2*  GLUCOSE 160* 107* 288*    CBG (last 3)   Recent Labs  03/04/14 1817 03/04/14 2050 03/05/14 0748  GLUCAP 118* 175* 197*    Scheduled Meds: . amLODipine  10 mg Oral Daily  . antiseptic oral rinse  7 mL Mouth Rinse QID  . calcitRIOL  0.25 mcg Oral Daily  . carvedilol  3.125 mg Oral BID WC  .  chlorhexidine  15 mL Mouth Rinse BID  . ferumoxytol  1,020 mg Intravenous Once  . insulin aspart  0-15 Units Subcutaneous TID AC & HS  . insulin aspart  3 Units Subcutaneous TID WC  . insulin glargine  20 Units Subcutaneous QHS  . insulin starter kit- syringes  1 kit Other Once  . living well with diabetes book   Does not apply Once  . sodium chloride  500 mL Intravenous Once    Continuous Infusions:   Elmer Picker MS Dietetic Intern Pager Number [phone]

## 2014-03-05 NOTE — Progress Notes (Signed)
[facility] TEAM 1 - Stepdown/ICU TEAM Progress Note  JENNEY BRESTER ZOX:[id number] DOB: 1955-12-09 DOA: 02/24/2014 PCP: No primary care provider on file.  Admit HPI / Brief Narrative: 59yo F who presented with DKA 1/17 to [facility] ED after being found unresponsive at home by her family. Found to be in AKI, septic, and in hypoxic resp failure due to aspiration pna. Profoundly acidotic despite bicarb gtt. Was taken off insulin gtt and got hyperglycemic so was put back on it. Still having non gap acidosis despite being on bicarb drip. Sputum cx growing strep pneumo. Intubated.  CT head normal.   Significant Events: 1/17 - admitted. DKA, severe sepsis. Started on vanc+zosyn. and insulin gtt, DKA protocol. Intubated for AMS 1/18 - 1/19 Gap closed but had hyperglycemia off insulin gtt so put back on it. Continued to have non gap acidosis. White count trending down. hgb dropped from 11.8 to 8-9 plt dropped from 323 --> 137. HIIT panel sent.  1/21 Extubated  HPI/Subjective: Patient has no complaints for me, had an unremarkable night. She is awake alert, tolerating by mouth intake..    Assessment/Plan:  Anion gap metabolic acidosis Due to ARF - now off bicarb, IV fluids being discontinued on 03/04/2014 Follow-up on a.m. lab work  DKA - Uncontrolled ?newly diagnosed DM A1c 13.2 - ?newly diagnosed DM - little is known about her past med hx  Diabetic educator recommending changing Lantus to insulin 70/30 12 units twice a day since patient still does not have insurance Will monitor blood sugars  Bil Strep pneumo Pneumonia Has now completed 9 days of abx coverage - clinically stabilizing  Antibiotic therapy stopped on 03/04/2014  Acute Kidney failure Unknown baseline crt - crt 1.8 at initial presentation - probable ATN - Nephrology is following -  Patient's creatinine increasing to 6.97 from 6.53, potassium 3.5, bicarbonate 23 nephrology following.  Thrombocytopenia HIT panel negative - plts  have now normalized/recovered  A.m. labs showing a platelet count of 382  Hypokalemia  Replaced  Anemia Hgb stable/improving   Diarrhea   C diff negative - trial of immodium helping   HTN BP remains elevated - adjust tx and follow   Code Status: FULL Family Communication: spoke w/ sister at bedside  Disposition Plan: transfer to renal bed - ?eventual CIR   Consultants: Nephrology PCCM  Antibiotics: Rocephin 1/21 > 1/25 Zosyn 1/17 > 1/20 Vanc 1/17 > 1/20  DVT prophylaxis: SCDs  Objective: Blood pressure 163/62, pulse 76, temperature 98 F (36.7 C), temperature source Oral, resp. rate 18, height  (1.702 m), weight 86.72 kg (191 lb 2.9 oz), SpO2 98 %.  Intake/Output Summary (Last 24 hours) at 03/05/14 1831 Last data filed at 03/05/14 1700  Gross per 24 hour  Intake    600 ml  Output    600 ml  Net      0 ml   Exam: General: No acute respiratory distress - alert and conversant  Lungs: Clear to auscultation bilaterally without wheezes or crackles Cardiovascular: Regular rate and rhythm without murmur gallop or rub Abdomen: Nontender, nondistended, soft, bowel sounds positive, no rebound, no ascites, no appreciable mass Extremities: No significant cyanosis, clubbing, edema bilateral lower extremities  Data Reviewed: Basic Metabolic Panel:  Recent Labs Lab 03/01/14 0712 03/02/14 0450 03/03/14 0400 03/04/14 0500 03/05/14 0523  NA 137 139 139 140 139  K 3.5 3.4* 3.3* 3.6 3.5  CL 101 99 98 100 100  CO2 GLUCOSE 209*  298* 160* 107* 288*  BUN 89* 94* 93* 88* 90*  CREATININE 5.68* 6.44* 6.53* 6.53* 6.97*  CALCIUM 8.4 8.2* 7.9* 7.8* 7.9*  PHOS 3.9 4.4 4.0 5.2* 6.2*    Liver Function Tests:  Recent Labs Lab 02/28/14 0520 03/01/14 0712 03/02/14 0450 03/03/14 0400 03/04/14 0500 03/05/14 0523  AST 16  --   --   --   --  19  ALT 14  --   --   --   --  14  ALKPHOS 279*  --   --   --   --  153*  BILITOT 0.6  --   --   --   --  0.5    PROT 5.0*  --   --   --   --  5.4*  ALBUMIN 1.3* 1.5* 1.4* 1.5* 1.6* 1.8*   CBC:  Recent Labs Lab 03/02/14 0450 03/03/14 0400 03/04/14 0500 03/04/14 0900 03/05/14 0523  WBC 11.2* 12.0* 13.0* 14.0* 11.1*  HGB 9.1* 9.0* 9.3* 10.1* 9.7*  HCT 26.5* 26.0* 27.6* 30.0* 28.4*  MCV 86.9 86.7 88.2 90.1 91.3  PLT 281 329 375 381 382   CBG:  Recent Labs Lab 03/04/14 1817 03/04/14 2050 03/05/14 0748 03/05/14 1225 03/05/14 1644  GLUCAP 118* 175* 197* 139* 109*    Recent Results (from the past 240 hour(s))  MRSA PCR Screening     Status: None   Collection Time: 02/24/14  4:13 PM  Result Value Ref Range Status   MRSA by PCR NEGATIVE NEGATIVE Final    Comment:        The GeneXpert MRSA Assay (FDA approved for NASAL specimens only), is one component of a comprehensive MRSA colonization surveillance program. It is not intended to diagnose MRSA infection nor to guide or monitor treatment for MRSA infections.   Culture, blood (routine x 2) Call MD if unable to obtain prior to antibiotics being given     Status: None   Collection Time: 02/24/14  6:09 PM  Result Value Ref Range Status   Specimen Description BLOOD RIGHT HAND  Final   Special Requests BOTTLES DRAWN AEROBIC AND ANAEROBIC 6CC  Final   Culture NO GROWTH 5 DAYS  Final   Report Status 03/01/2014 FINAL  Final  Culture, blood (routine x 2) Call MD if unable to obtain prior to antibiotics being given     Status: None   Collection Time: 02/24/14  6:25 PM  Result Value Ref Range Status   Specimen Description BLOOD PICC LINE DRAWN BY RN  Final   Special Requests BOTTLES DRAWN AEROBIC AND ANAEROBIC 6CC  Final   Culture NO GROWTH 5 DAYS  Final   Report Status 03/01/2014 FINAL  Final  Culture, respiratory (NON-Expectorated)     Status: None   Collection Time: 02/24/14 11:35 PM  Result Value Ref Range Status   Specimen Description SPUTUM  Final   Special Requests NONE  Final   Gram Stain   Final    ABUNDANT WBC PRESENT,  PREDOMINANTLY PMN NO SQUAMOUS EPITHELIAL CELLS SEEN ABUNDANT GRAM POSITIVE COCCI IN PAIRS Performed at [facility]    Culture   Final    FEW STREPTOCOCCUS PNEUMONIAE Performed at [facility]    Report Status 02/28/2014 FINAL  Final   Organism ID, Bacteria STREPTOCOCCUS PNEUMONIAE  Final      Susceptibility   Streptococcus pneumoniae - MIC (ETEST)*    CEFTRIAXONE 0.012 SENSITIVE Sensitive     LEVOFLOXACIN 0.50 SENSITIVE Sensitive  PENICILLIN 0.016 SENSITIVE Sensitive     * FEW STREPTOCOCCUS PNEUMONIAE  MRSA PCR Screening     Status: None   Collection Time: 02/27/14  6:27 PM  Result Value Ref Range Status   MRSA by PCR NEGATIVE NEGATIVE Final    Comment:        The GeneXpert MRSA Assay (FDA approved for NASAL specimens only), is one component of a comprehensive MRSA colonization surveillance program. It is not intended to diagnose MRSA infection nor to guide or monitor treatment for MRSA infections.   Clostridium Difficile by PCR     Status: None   Collection Time: 03/02/14  7:11 AM  Result Value Ref Range Status   C difficile by pcr NEGATIVE NEGATIVE Final     Studies:  Recent x-ray studies have been reviewed in detail by the Attending Physician  Scheduled Meds:  Scheduled Meds: . amLODipine  10 mg Oral Daily  . antiseptic oral rinse  7 mL Mouth Rinse QID  . calcitRIOL  0.25 mcg Oral Daily  . carvedilol  3.125 mg Oral BID WC  . chlorhexidine  15 mL Mouth Rinse BID  . insulin aspart  0-15 Units Subcutaneous TID AC & HS  . insulin aspart  3 Units Subcutaneous TID WC  . insulin glargine  20 Units Subcutaneous QHS  . living well with diabetes book   Does not apply Once    Time spent on care of this patient: 35 mins   Jeralyn Bennett , MD   [facility] Office  [phone] Pager - Text Page per Loretha Stapler as per below:  On-Call/Text Page:      [email or web address]      password [email or web address]  If 7PM-7AM, please contact  night-coverage [email or web address] Password [facility] 03/05/2014, 6:31 PM   LOS: 9 days

## 2014-03-05 NOTE — Progress Notes (Signed)
Inpatient Diabetes Program Recommendations  AACE/ADA: New Consensus Statement on Inpatient Glycemic Control (2013)  Target Ranges:  Prepandial:   less than 140 mg/dL      Peak postprandial:   less than 180 mg/dL (1-2 hours)      Critically ill patients:  140 - 180 mg/dL   Results for Jackie Fisher, Jackie Fisher (MRN [id number]) as of 03/05/2014 10:40  Ref. Range 03/04/2014 07:41 03/04/2014 11:29 03/04/2014 18:17 03/04/2014 20:50 03/05/2014 07:48  Glucose-Capillary Latest Range: 70-99 mg/dL 91 149 (H) 118 (H) 175 (H) 197 (H)   Diabetes history: NO Outpatient Diabetes medications: NA Current orders for Inpatient glycemic control: Lantus 16 units QHS, Novolog 0-15 units ACHS, Novolog 3 units TID with meals  Inpatient Diabetes Program Recommendations Insulin - Basal: Since patient has no insurance, please consider changing basal insulin to 70/30 12 units BID (eqivalent to 16 units for basal needs and 8 units for meal coverage).  Insulin - Meal Coverage: If 70/30 is ordered, please discontinue Novolog meal coverage since 70/30 will help cover carbohydrates consumed with meals.  Note: Spoke with patient about new diabetes diagnosis. Discussed A1C results (13.2% on 02/24/14) and explained what an A1C is, basic pathophysiology of DM Type 2, basic home care, importance of checking CBGs and maintaining good CBG control to prevent long-term and short-term complications. Reviewed signs and symptoms of hyperglycemia and hypoglycemia along with treatment for both. Discussed impact of nutrition, exercise, stress, sickness, and medications on diabetes control.  Briefly discussed carbohydrates, carbohydrate goals per day and meal, along with portion sizes. Patient states that she does not have a PCP or insurance.  Inquired about ability to follow up at the [facility] and patient states that she does not think she can get transportation to come that far for follow up. Patient reports that she lives  in [place] and prefers to establish care with a PCP close to her home. Have asked patient to check her glucose 3-4 times per day and keep a log of readings she can take with her to follow up visits. Placed consult for CM to follow for establishing follow up care and medication assistance. Discussed Lantus, Novolog, and 70/30 insulin and explained and demonstrated how to draw up and administer insulin with vial and syringe. Since patient does not have insurance would not recommend Lantus (over $250 per vial) or Novolog (over $150 per vial). Instead recommend switching to Novolin 70/30 which can be purchased from [facility] for $25 per vial. Patient was able to return demonstration with verbal cues. Patient will require practice with drawing up insulin and administering insulin.   Patient verbalized understanding of information discussed and she states that she has no further questions at this time related to diabetes. RNs to provide ongoing basic DM education at bedside with this patient and engage patient to actively check blood glucose, draw up insulin, and administer insulin injections. Have ordered educational booklet, insulin starter kit, and DM videos.  At time of discharge home: Patient will need a prescription for a glucometer, test strips, lancets, insulin, and insulin syringes.   Thanks, Barnie Alderman, RN, MSN, CCRN Diabetes Coordinator Inpatient Diabetes Program [phone] (Team Pager) [phone] (AP office) [phone] [facility] office)

## 2014-03-05 NOTE — Progress Notes (Signed)
Patient educated about insulin and also glucometer.  She administered insulin by herself in the afternoon, present beside her supervising.

## 2014-03-06 DIAGNOSIS — E876 Hypokalemia: Secondary | ICD-10-CM | POA: Insufficient documentation

## 2014-03-06 LAB — CBC
HCT: 28.5 % — ABNORMAL LOW (ref 36.0–46.0)
Hemoglobin: 9.6 g/dL — ABNORMAL LOW (ref 12.0–15.0)
MCH: 30.3 pg (ref 26.0–34.0)
MCHC: 33.7 g/dL (ref 30.0–36.0)
MCV: 89.9 fL (ref 78.0–100.0)
PLATELETS: 397 10*3/uL (ref 150–400)
RBC: 3.17 MIL/uL — ABNORMAL LOW (ref 3.87–5.11)
RDW: 14 % (ref 11.5–15.5)
WBC: 13.5 10*3/uL — AB (ref 4.0–10.5)

## 2014-03-06 LAB — RENAL FUNCTION PANEL
Albumin: 1.9 g/dL — ABNORMAL LOW (ref 3.5–5.2)
Anion gap: 16 — ABNORMAL HIGH (ref 5–15)
BUN: 81 mg/dL — AB (ref 6–23)
CALCIUM: 8.2 mg/dL — AB (ref 8.4–10.5)
CHLORIDE: 100 mmol/L (ref 96–112)
CO2: 23 mmol/L (ref 19–32)
CREATININE: 6.93 mg/dL — AB (ref 0.50–1.10)
GFR calc Af Amer: 7 mL/min — ABNORMAL LOW (ref >90)
GFR, EST NON AFRICAN AMERICAN: 6 mL/min — AB (ref >90)
GLUCOSE: 138 mg/dL — AB (ref 70–99)
PHOSPHORUS: 6.4 mg/dL — AB (ref 2.3–4.6)
POTASSIUM: 3.5 mmol/L (ref 3.5–5.1)
SODIUM: 139 mmol/L (ref 135–145)

## 2014-03-06 LAB — GLUCOSE, CAPILLARY
GLUCOSE-CAPILLARY: 121 mg/dL — AB (ref 70–99)
Glucose-Capillary: 85 mg/dL (ref 70–99)
Glucose-Capillary: 44 mg/dL — CL (ref 70–99)
Glucose-Capillary: 107 mg/dL — ABNORMAL HIGH (ref 70–99)
Glucose-Capillary: 268 mg/dL — ABNORMAL HIGH (ref 70–99)
Glucose-Capillary: 224 mg/dL — ABNORMAL HIGH (ref 70–99)

## 2014-03-06 MED ORDER — CARVEDILOL 6.25 MG PO TABS
6.2500 mg | ORAL_TABLET | Freq: Two times a day (BID) | ORAL | Status: DC
  Administered 2014-03-06 – 2014-03-13 (×15): 6.25 mg via ORAL
  Filled 2014-03-06 (×18): qty 1

## 2014-03-06 MED ORDER — CARVEDILOL 6.25 MG PO TABS: 6.2500 mg | ORAL_TABLET | Freq: Two times a day (BID) | ORAL | Status: DC

## 2014-03-06 NOTE — Progress Notes (Signed)
[facility] PROGRESS NOTE  KENDELL GAMMON WUJ:[id number] DOB: 04-28-1955 DOA: 02/24/2014 PCP: No primary care provider on file.  Admit HPI / Brief Narrative: 59yo F who presented with DKA 1/17 to [facility] ED after being found unresponsive at home by her family. Found to be in AKI, septic, and in hypoxic resp failure due to aspiration pna. Profoundly acidotic despite bicarb gtt. Was taken off insulin gtt and got hyperglycemic so was put back on it. Still having non gap acidosis despite being on bicarb drip. Sputum cx growing strep pneumo. Intubated. CT head normal.   Significant Events: 1/17 - admitted. DKA, severe sepsis. Started on vanc+zosyn. and insulin gtt, DKA protocol. Intubated for AMS 1/18 - 1/19 Gap closed but had hyperglycemia off insulin gtt so put back on it. Continued to have non gap acidosis. White count trending down. hgb dropped from 11.8 to 8-9 plt dropped from 323 --> 137. HIIT panel sent.  1/21 Extubated   Assessment/Plan: #1 DKA./New onset diabetes mellitus Patient was admitted with DKA which is currently resolved. Anion gap is closed. IV fluids have been discontinued. Hemoglobin A1c is 13.2. Patient currently on insulin 70/3012 units twice a day. Will discontinue meal coverage insulin. Follow.  #2 bilateral pneumonia Status post 9 days of antibiotic coverage. Antibiotics have been discontinued.  #3 acute on chronic kidney disease stage III Patient with urine output of 350 mL over the past 24 hours. Renal function with no significant improvement. Nephrology following. Nephrology recommending observation for the next 24-48 hours prior to deciding as to whether to start patient on hemodialysis.  #4 thrombocytopenia HITT panel negative. Platelets have improved. Follow.  #5 hypokalemia Repleted.  #6 diarrhea C. difficile negative. Imodium as needed.  #7 hypertension Increase Coreg to 6.25 mg by mouth twice a day. Continue Norvasc. Follow.  #8 iron  deficiency anemia/anemia of chronic disease Anemia panel consistent with iron deficiency anemia.  H&H stable.   Code Status: Full Family Communication: Updated patient, no family present. Disposition Plan: Home with home health when medically stable.   Consultants:  Nephrology: Dr. Eliott Nine 02/28/2014  Procedures:  Chest x-ray 02/24/2014, 02/25/2014, 02/26/2014, 02/28/2014  Abdominal x-rays 02/27/2014  Renal ultrasound 02/25/2014  CT head/CT C-spine 02/24/2014  Antibiotics:  IV Zosyn 02/24/2014>>>> 02/28/2014  IV vancomycin 02/24/2014>>>> 02/28/2014  IV Rocephin 02/28/2014>>>> 03/04/2014  IV azithromycin 02/24/2014>>>>> 02/24/2014  HPI/Subjective: Patient with no complaints. Patient states she is having good urinary output. No nausea no vomiting. Tolerating current diet.  Objective: Filed Vitals:   03/06/14 0533  BP: 162/61  Pulse: 72  Temp: 99.5 F (37.5 C)  Resp: 18    Intake/Output Summary (Last 24 hours) at 03/06/14 1144 Last data filed at 03/06/14 1100  Gross per 24 hour  Intake    720 ml  Output    500 ml  Net    220 ml   Filed Weights   03/04/14 0455 03/04/14 2055 03/05/14 2118  Weight: 85.5 kg (188 lb 7.9 oz) 86.72 kg (191 lb 2.9 oz) 85 kg (187 lb 6.3 oz)    Exam:   General:  NAD  Cardiovascular: RRR  Respiratory: CTAB  Abdomen: Soft, nontender, nondistended, positive bowel sounds  Musculoskeletal: No clubbing cyanosis or edema.  Data Reviewed: Basic Metabolic Panel:  Recent Labs Lab 03/02/14 0450 03/03/14 0400 03/04/14 0500 03/05/14 0523 03/06/14 0453  NA 139 139 140 139 139  K 3.4* 3.3* 3.6 3.5 3.5  CL 99 98 100 100 100  CO2 23  GLUCOSE 298* 160* 107* 288* 138*  BUN 94* 93* 88* 90* 81*  CREATININE 6.44* 6.53* 6.53* 6.97* 6.93*  CALCIUM 8.2* 7.9* 7.8* 7.9* 8.2*  PHOS 4.4 4.0 5.2* 6.2* 6.4*   Liver Function Tests:  Recent Labs Lab 02/28/14 0520  03/02/14 0450 03/03/14 0400 03/04/14 0500 03/05/14 0523  03/06/14 0453  AST 16  --   --   --   --  19  --   ALT 14  --   --   --   --  14  --   ALKPHOS 279*  --   --   --   --  153*  --   BILITOT 0.6  --   --   --   --  0.5  --   PROT 5.0*  --   --   --   --  5.4*  --   ALBUMIN 1.3*  < > 1.4* 1.5* 1.6* 1.8* 1.9*  < > = values in this interval not displayed. No results for input(s): LIPASE, AMYLASE in the last 168 hours. No results for input(s): AMMONIA in the last 168 hours. CBC:  Recent Labs Lab 03/03/14 0400 03/04/14 0500 03/04/14 0900 03/05/14 0523 03/06/14 0453  WBC 12.0* 13.0* 14.0* 11.1* 13.5*  HGB 9.0* 9.3* 10.1* 9.7* 9.6*  HCT 26.0* 27.6* 30.0* 28.4* 28.5*  MCV 86.7 88.2 90.1 91.3 89.9  PLT 329 375 381 382 397   Cardiac Enzymes: No results for input(s): CKTOTAL, CKMB, CKMBINDEX, TROPONINI in the last 168 hours. BNP (last 3 results) No results for input(s): PROBNP in the last 8760 hours. CBG:  Recent Labs Lab 03/05/14 0748 03/05/14 1225 03/05/14 1644 03/05/14 2115 03/06/14 0835  GLUCAP 197* 139* 109* 162* 121*    Recent Results (from the past 240 hour(s))  MRSA PCR Screening     Status: None   Collection Time: 02/24/14  4:13 PM  Result Value Ref Range Status   MRSA by PCR NEGATIVE NEGATIVE Final    Comment:        The GeneXpert MRSA Assay (FDA approved for NASAL specimens only), is one component of a comprehensive MRSA colonization surveillance program. It is not intended to diagnose MRSA infection nor to guide or monitor treatment for MRSA infections.   Culture, blood (routine x 2) Call MD if unable to obtain prior to antibiotics being given     Status: None   Collection Time: 02/24/14  6:09 PM  Result Value Ref Range Status   Specimen Description BLOOD RIGHT HAND  Final   Special Requests BOTTLES DRAWN AEROBIC AND ANAEROBIC 6CC  Final   Culture NO GROWTH 5 DAYS  Final   Report Status 03/01/2014 FINAL  Final  Culture, blood (routine x 2) Call MD if unable to obtain prior to antibiotics being given      Status: None   Collection Time: 02/24/14  6:25 PM  Result Value Ref Range Status   Specimen Description BLOOD PICC LINE DRAWN BY RN  Final   Special Requests BOTTLES DRAWN AEROBIC AND ANAEROBIC 6CC  Final   Culture NO GROWTH 5 DAYS  Final   Report Status 03/01/2014 FINAL  Final  Culture, respiratory (NON-Expectorated)     Status: None   Collection Time: 02/24/14 11:35 PM  Result Value Ref Range Status   Specimen Description SPUTUM  Final   Special Requests NONE  Final   Gram Stain   Final    ABUNDANT WBC PRESENT, PREDOMINANTLY PMN NO SQUAMOUS EPITHELIAL CELLS SEEN ABUNDANT  GRAM POSITIVE COCCI IN PAIRS Performed at [facility]    Culture   Final    FEW STREPTOCOCCUS PNEUMONIAE Performed at [facility]    Report Status 02/28/2014 FINAL  Final   Organism ID, Bacteria STREPTOCOCCUS PNEUMONIAE  Final      Susceptibility   Streptococcus pneumoniae - MIC (ETEST)*    CEFTRIAXONE 0.012 SENSITIVE Sensitive     LEVOFLOXACIN 0.50 SENSITIVE Sensitive     PENICILLIN 0.016 SENSITIVE Sensitive     * FEW STREPTOCOCCUS PNEUMONIAE  MRSA PCR Screening     Status: None   Collection Time: 02/27/14  6:27 PM  Result Value Ref Range Status   MRSA by PCR NEGATIVE NEGATIVE Final    Comment:        The GeneXpert MRSA Assay (FDA approved for NASAL specimens only), is one component of a comprehensive MRSA colonization surveillance program. It is not intended to diagnose MRSA infection nor to guide or monitor treatment for MRSA infections.   Clostridium Difficile by PCR     Status: None   Collection Time: 03/02/14  7:11 AM  Result Value Ref Range Status   C difficile by pcr NEGATIVE NEGATIVE Final     Studies: No results found.  Scheduled Meds: . amLODipine  10 mg Oral Daily  . antiseptic oral rinse  7 mL Mouth Rinse QID  . calcitRIOL  0.25 mcg Oral Daily  . carvedilol  6.25 mg Oral BID WC  . chlorhexidine  15 mL Mouth Rinse BID  . insulin aspart  0-15 Units  Subcutaneous TID AC & HS  . insulin aspart  3 Units Subcutaneous TID WC  . insulin aspart protamine- aspart  12 Units Subcutaneous BID WC   Continuous Infusions:   Principal Problem:   DKA (diabetic ketoacidoses) Active Problems:   Acute encephalopathy   Acute respiratory failure with hypoxia   Acute renal failure   Sepsis with acute organ dysfunction   Aspiration pneumonia   Metabolic acidosis   Sepsis   Altered mental state   Community acquired pneumonia   Acute respiratory failure    Time spent: 40 mins    [facility] California[facility]OMPSON,DANIEL MD [facility] Pager [phone]. If 7PM-7AM, please contact night-coverage at [email or web address], password [email or web address] 03/06/2014, 11:44 AM  LOS: 10 days

## 2014-03-06 NOTE — Progress Notes (Signed)
I met with pt at bedside. She states her sister can provide 24/7 supervision at d/c. Her sister is also a diabetic but not on insulin. She has a friend on dialysis. Await PT follow up today, but pt likely d/c home when medically ready. [phone]

## 2014-03-06 NOTE — Progress Notes (Signed)
Hypoglycemic Event  CBG: 44  Treatment: 15 GM carbohydrate snack  Symptoms: Sweaty  Follow-up CBG: Time: 2:48 pm CBG Result:107  Possible Reasons for Event: Inadequate meal intake and Other: started on 70/30 insulin from yesterday  Comments/MD notified:Dr.  Jannet AskewanielThompson     Waylin Dorko  Remember to initiate Hypoglycemia Order Set & complete

## 2014-03-06 NOTE — Progress Notes (Addendum)
Occupational Therapy Treatment Patient Details Name: Jackie Fisher MRN: [id number] DOB: March 19, 1955 Today's Date: 03/06/2014    History of present illness Patient is a 59 yo female admitted 02/24/14 to [facility] unresponsive.  Patient with DKA, hypoxic resp failure, asp pna bil., septic, AKI, acute encephalopathy and was intubated.  Patient transferred to [facility] on 02/27/14.  Patient extubated 02/28/14.   OT comments  Pt improving and ambulated to sink to perform ADLs. Recommending HHOT and 24/7 supervision upon d/c.   Follow Up Recommendations  Home health OT;Supervision/Assistance - 24 hour    Equipment Recommendations  Other (comment) (tbd)    Recommendations for Other Services      Precautions / Restrictions Precautions Precautions: Fall Restrictions Weight Bearing Restrictions: No       Mobility Bed Mobility Overal bed mobility: Modified Independent                Transfers Overall transfer level: Needs assistance Equipment used: Rolling walker (2 wheeled) Transfers: Sit to/from Stand Sit to Stand: Min guard         General transfer comment: cues for hand placement/placement of walker/technique.        ADL Overall ADL's : Needs assistance/impaired     Grooming: Wash/dry face;Oral care;Applying deodorant;Sitting;Standing;Min guard   Upper Body Bathing: Moderate assistance;Standing;Sitting Upper Body Bathing Details (indicate cue type and reason): LOB at sink when standing Lower Body Bathing: Min guard;Sit to/from stand   Upper Body Dressing : Set up;Supervision/safety;Sitting/Standing   Lower Body Dressing: Min guard;Sitting/lateral leans (socks)   Toilet Transfer: Min guard;Ambulation;BSC;RW           Functional mobility during ADLs: Min guard;Rolling walker General ADL Comments: Pt ambulated to sink and performed ADLs. Took a few breaks. Cues given for how to reach feet. Discussed d/c options.      Vision                     Perception      Praxis      Cognition  Awake/Alert Behavior During Therapy: WFL for tasks assessed/performed Overall Cognitive Status:  (unsure of baseline; no family present) Area of Impairment: Problem solving              Problem Solving: Slow processing;Requires verbal cues (pt taking increased time for tasks)      Extremity/Trunk Assessment               Exercises     Shoulder Instructions       General Comments      Pertinent Vitals/ Pain       Pain Assessment: No/denies pain  Home Living                                          Prior Functioning/Environment              Frequency Min 2X/week     Progress Toward Goals  OT Goals(current goals can now be found in the care plan section)  Progress towards OT goals: Progressing toward goals- some goals updated due to progress  Acute Rehab OT Goals Patient Stated Goal: None stated OT Goal Formulation: With patient Time For Goal Achievement: 03/11/14 Potential to Achieve Goals: Good ADL Goals Pt Will Perform Grooming: with set-up;standing Pt Will Perform Lower Body Bathing: sit to/from stand;with set-up Pt Will Perform Lower Body Dressing: sit to/from stand;with set-up  Pt Will Transfer to Toilet: with supervision;ambulating Pt Will Perform Toileting - Clothing Manipulation and hygiene: with setup;sit to/from stand Additional ADL Goal #1: Pt will be independent with HEP for bilateral UE's to increase strength.  Plan Discharge plan needs to be updated    Co-evaluation                 End of Session Equipment Utilized During Treatment: Gait belt;Rolling walker   Activity Tolerance Patient limited by fatigue   Patient Left in bed;with call bell/phone within reach;with bed alarm set   Nurse Communication          Time: [id number] OT Time Calculation (min): 29 min  Charges: OT General Charges $OT Visit: 1 Procedure OT Treatments $Self Care/Home Management : 23-37  mins  Earlie RavelingStraub, Sariya Trickey L OTR/L [phone] 03/06/2014, 11:49 AM

## 2014-03-06 NOTE — Progress Notes (Signed)
Subjective: Interval History: has no complaint .  Objective: Vital signs in last 24 hours: Temp:  [98 F (36.7 C)-99.5 F (37.5 C)] 99.5 F (37.5 C) (01/27 0533) Pulse Rate:  [71-76] 72 (01/27 0533) Resp:  [18-20] 18 (01/27 0533) BP: (152-163)/(61-64) 162/61 mmHg (01/27 0533) SpO2:  [91 %-98 %] 91 % (01/27 0533) Weight:  [85 kg (187 lb 6.3 oz)] 85 kg (187 lb 6.3 oz) (01/26 2118) Weight change: -1.72 kg (-3 lb 12.7 oz)  Intake/Output from previous day: 01/26 0701 - 01/27 0700 In: 600 [P.O.:600] Out: 350 [Urine:350] Intake/Output this shift:    General appearance: alert, cooperative, no distress and moderately obese Resp: diminished breath sounds bilaterally and rales bibasilar Cardio: S1, S2 normal and systolic murmur: holosystolic 2/6, blowing at apex GI: pos bs, liver down 6 cm soft Extremities: edema 1+, PICC RUA  Lab Results:  Recent Labs  03/05/14 0523 03/06/14 0453  WBC 11.1* 13.5*  HGB 9.7* 9.6*  HCT 28.4* 28.5*  PLT 382 397   BMET:  Recent Labs  03/05/14 0523 03/06/14 0453  NA 139 139  K 3.5 3.5  CL 100 100  CO2 23 23  GLUCOSE 288* 138*  BUN 90* 81*  CREATININE 6.97* 6.93*  CALCIUM 7.9* 8.2*    Recent Labs  03/04/14 0900  PTH 106*   Iron Studies:  Recent Labs  03/04/14 0900  IRON 21*  TIBC 216*    Studies/Results: No results found.  I have reviewed the patient's current medications.  Assessment/Plan: 1 CKD3 with AKI  Mild vol xs.  No recovery of GFR.  ?? Urine vol.  Acid/base ok. 2 anemia given iv Fe 3 Sepsis resolved 4 HPTH vit D 5 DM controlled P conservative mgmt, if not better in 2-3 d, and stable, will consider whether to put on reg HD or outpatient close f/u    LOS: 10 days   Vivien Barretto L 03/06/2014,10:08 AM

## 2014-03-06 NOTE — Progress Notes (Signed)
Physical Therapy Treatment Patient Details Name: Jackie BannisterLula D [place] MRN: [id number] DOB: [birth year] Today's Date: 03/06/2014    History of Present Illness Patient is a 59 yo female admitted 02/24/14 to [facility] unresponsive.  Patient with DKA, hypoxic resp failure, asp pna bil., septic, AKI, acute encephalopathy and was intubated.  [facility].  Patient extubated 02/28/14.    PT Comments    Pt was limited by hypoglycemia during session today. RN notified as pt was demonstrating lethargy, decreased attention, and decreased following of commands. Pt was able to sit EOB however was not able to stand. CBG was at 44 and therapy session was ended. Will continue to follow. Based on discussion with OT and chart review, anticipate pt will be appropriate for d/c home with 24 hour supervision.   Follow Up Recommendations  Home health PT;Supervision/Assistance - 24 hour     Equipment Recommendations  Rolling walker with 5" wheels    Recommendations for Other Services       Precautions / Restrictions Precautions Precautions: Fall Restrictions Weight Bearing Restrictions: No    Mobility  Bed Mobility Overal bed mobility: Modified Independent Bed Mobility: Supine to Sit     Supine to sit: Max assist     General bed mobility comments: Pt required increased assist this session due to lethargy and decreased initiation of movement/following commands  Transfers Overall transfer level: Needs assistance Equipment used: Rolling walker (2 wheeled) Transfers: Sit to/from Stand Sit to Stand: Min guard         General transfer comment: Deferred  Ambulation/Gait                 Stairs            Wheelchair Mobility    Modified Rankin (Stroke Patients Only)       Balance Overall balance assessment: Needs assistance Sitting-balance support: Feet supported;No upper extremity supported Sitting balance-Leahy Scale: Fair Sitting balance - Comments: Pt very  wobbly while sitting EOB. Did not need assist however could tell that trunk control was decreased this session. Possibly due to low blood sugars.                            Cognition Arousal/Alertness: Lethargic Behavior During Therapy: Flat affect Overall Cognitive Status: Impaired/Different from baseline Area of Impairment: Problem solving Orientation Level: Disoriented to;Situation;Time   Memory: Decreased short-term memory     Awareness: Emergent Problem Solving: Slow processing;Requires verbal cues      Exercises      General Comments        Pertinent Vitals/Pain Pain Assessment: No/denies pain    Home Living                      Prior Function            PT Goals (current goals can now be found in the care plan section) Acute Rehab PT Goals Patient Stated Goal: None stated PT Goal Formulation: With patient Time For Goal Achievement: 03/08/14 Potential to Achieve Goals: Good Progress towards PT goals: Progressing toward goals    Frequency  Min 3X/week    PT Plan Discharge plan needs to be updated    Co-evaluation             End of Session   Activity Tolerance: Patient limited by lethargy;Treatment limited secondary to medical complications (Comment) (Low blood sugar at 44) Patient left: in bed;with call  bell/phone within reach;with nursing/sitter in room;with family/visitor present (Sitting EOB)     Time: 1610-9604 PT Time Calculation (min) (ACUTE ONLY): 30 min  Charges:  $Therapeutic Activity: 23-37 mins                    G Codes:      Conni Slipper 14-Mar-2014, 3:00 PM   Conni Slipper, PT, DPT Acute Rehabilitation Services Pager: [phone]

## 2014-03-07 DIAGNOSIS — D72829 Elevated white blood cell count, unspecified: Secondary | ICD-10-CM | POA: Clinically undetermined

## 2014-03-07 LAB — GLUCOSE, CAPILLARY
GLUCOSE-CAPILLARY: 212 mg/dL — AB (ref 70–99)
GLUCOSE-CAPILLARY: 132 mg/dL — AB (ref 70–99)
Glucose-Capillary: 111 mg/dL — ABNORMAL HIGH (ref 70–99)
Glucose-Capillary: 122 mg/dL — ABNORMAL HIGH (ref 70–99)
Glucose-Capillary: 79 mg/dL (ref 70–99)

## 2014-03-07 LAB — CBC
HCT: 27.6 % — ABNORMAL LOW (ref 36.0–46.0)
HEMOGLOBIN: 9.3 g/dL — AB (ref 12.0–15.0)
MCH: 30.2 pg (ref 26.0–34.0)
MCHC: 33.7 g/dL (ref 30.0–36.0)
MCV: 89.6 fL (ref 78.0–100.0)
Platelets: 374 10*3/uL (ref 150–400)
RBC: 3.08 MIL/uL — AB (ref 3.87–5.11)
RDW: 14 % (ref 11.5–15.5)
WBC: 17.2 10*3/uL — AB (ref 4.0–10.5)

## 2014-03-07 LAB — RENAL FUNCTION PANEL
Albumin: 1.9 g/dL — ABNORMAL LOW (ref 3.5–5.2)
Anion gap: 12 (ref 5–15)
BUN: 80 mg/dL — ABNORMAL HIGH (ref 6–23)
CHLORIDE: 99 mmol/L (ref 96–112)
CO2: 26 mmol/L (ref 19–32)
Calcium: 8.1 mg/dL — ABNORMAL LOW (ref 8.4–10.5)
Creatinine, Ser: 6.69 mg/dL — ABNORMAL HIGH (ref 0.50–1.10)
GFR calc Af Amer: 7 mL/min — ABNORMAL LOW (ref >90)
GFR, EST NON AFRICAN AMERICAN: 6 mL/min — AB (ref >90)
Glucose, Bld: 187 mg/dL — ABNORMAL HIGH (ref 70–99)
POTASSIUM: 3.5 mmol/L (ref 3.5–5.1)
Phosphorus: 6.8 mg/dL — ABNORMAL HIGH (ref 2.3–4.6)
SODIUM: 137 mmol/L (ref 135–145)

## 2014-03-07 MED ORDER — INSULIN ASPART PROT & ASPART (70-30 MIX) 100 UNIT/ML ~~LOC~~ SUSP
15.0000 [IU] | Freq: Two times a day (BID) | SUBCUTANEOUS | Status: DC
  Administered 2014-03-07: 15 [IU] via SUBCUTANEOUS
  Filled 2014-03-07: qty 10

## 2014-03-07 NOTE — Care Management Note (Signed)
CARE MANAGEMENT NOTE 03/07/2014  Patient:  Jackie Fisher, Jackie Fisher   Account Number:  [id number]  Date Initiated:  02/25/2014  Documentation initiated by:  CHILDRESS,JESSICA  Subjective/Objective Assessment:   Pt found unresponsive at home. Information obtained from brother. Pt lives alone independently. Pt has not been to [facility] in years but has been to [facility] in the past.     Action/Plan:   Pt is intubated and unconsious at this time. Will continue to follow for CM needs.  03/05/2014 Pt alert and able to work with PT and OT, met with pt and discussed [facility] she has transportation , appointment scheduled.   Anticipated DC Date:  03/09/2014   Anticipated DC Plan:  [place]  CM consult      Choice offered to / List presented to:             Status of service:  In process, will continue to follow Medicare Important Message given?   (If response is "NO", the following Medicare IM given date fields will be blank) Date Medicare IM given:   Medicare IM given by:   Date Additional Medicare IM given:   Additional Medicare IM given by:    Discharge Disposition:    Per UR Regulation:    If discussed at Long Length of Stay Meetings, dates discussed:   03/05/2014    Comments:  02/25/2014 [place], RN, MSN, [facility] 03/07/2014 Noted orders for [facility] however pt is uninsured and we may be able to get [facility] only. If pt becomes HD dependent we can have her family apply for Medicaid and the pt could get [facility] and Social worker still no HHPT or [place]. CRoyal RN MPH, case manager, [phone]  03/05/2014 Met with pt who varified that she is uninsured, she does have transportation and is interested in the [employer] in [place]. Appointment scheduled and info placed in pt chart, appointment is for Wed, Mar 13, 2014 at 10am, pt will be given a list of items to bring in order to be eval for adm to the clinic.  This infor was  placed in the front of the pt chart to be given to the pt at the time of d/c. Will follow for possible [facility] services for disease management at d/c. CIR following , however pt appears to be improving and may not qualify for CIR. CRoyal RN MPH, case manager, [phone]   03/01/14  12noon.  Luz Lex, RNBSN [phone] Patient states has no insurance - cannot afford diabetic meds.   patient from [facility].  Tried to talk to patient. Briefly opened eyes - only answering in grunts.  Did nod negative to not having a PCP.  Once more alert will need to try to set her up with resources closer to home.  Can get into the [facility] here in [place] but would probably not want to do that due to distance.  CM will continue to follow.  02-28-14 3:15pm Luz Lex, RNBSN [phone] Tx to cone on 1-20 due to continued acidosis and vent. Extubated today.

## 2014-03-07 NOTE — Progress Notes (Signed)
[facility]. Pt states she will have 24/7 supervision from family at d/c. [phone]

## 2014-03-07 NOTE — Progress Notes (Signed)
Physical Therapy Treatment Patient Details Name: Jackie BannisterLula D Fisher MRN: [id number] DOB: 10-03-1955 Today's Date: 03/07/2014    History of Present Illness Patient is a 59 yo female admitted 02/24/14 to [facility] unresponsive.  Patient with DKA, hypoxic resp failure, asp pna bil., septic, AKI, acute encephalopathy and was intubated.  [facility].  Patient extubated 02/28/14.    PT Comments    Pt progressing towards physical therapy goals. She demonstrated the ability to ambulate the distance needed to get from the car to the house, and to perform peri-care without assistance. Pt and family member (present in room) state they feel comfortable with pt returning home, and family member reports she will be primary caregiver available 24 hours. Will continue to follow and progress as able per POC.   Follow Up Recommendations  Home health PT;Supervision/Assistance - 24 hour     Equipment Recommendations  Rolling walker with 5" wheels    Recommendations for Other Services       Precautions / Restrictions Precautions Precautions: Fall Restrictions Weight Bearing Restrictions: No    Mobility  Bed Mobility Overal bed mobility: Modified Independent Bed Mobility: Supine to Sit           General bed mobility comments: Increased time and use of bed rails for support, however no assist from therapist required.   Transfers Overall transfer level: Needs assistance Equipment used: Rolling walker (2 wheeled) Transfers: Sit to/from [employer]Stand;Stand Pivot Transfers Sit to Stand: Min guard Stand pivot transfers: Min guard       General transfer comment: Pt was able to power-up to full standing with VC's for hand placement on seated surface for safety. Increased cues for safety when going to/from [facility]  Ambulation/Gait Ambulation/Gait assistance: Min guard Ambulation Distance (Feet): 125 Feet Assistive device: Rolling walker (2 wheeled) Gait Pattern/deviations: Step-through  pattern;Decreased stride length;Trunk flexed;Narrow base of support Gait velocity: Very slow Gait velocity interpretation: Below normal speed for age/gender General Gait Details: Pt fatigued quickly during gait training. She was able to ambulate 50', then after a seated rest break ambulate 60', and a final 15' from [facility] to recliner chair.    Stairs            Wheelchair Mobility    Modified Rankin (Stroke Patients Only)       Balance Overall balance assessment: Needs assistance Sitting-balance support: Feet supported Sitting balance-Leahy Scale: Fair     Standing balance support: Bilateral upper extremity supported;During functional activity Standing balance-Leahy Scale: Poor                      Cognition Arousal/Alertness: Lethargic Behavior During Therapy: Flat affect Overall Cognitive Status: Impaired/Different from baseline Area of Impairment: Problem solving Orientation Level: Disoriented to;Situation;Time   Memory: Decreased short-term memory     Awareness: Emergent Problem Solving: Slow processing;Requires verbal cues      Exercises      General Comments        Pertinent Vitals/Pain Pain Assessment: No/denies pain    Home Living                      Prior Function            PT Goals (current goals can now be found in the care plan section) Acute Rehab PT Goals Patient Stated Goal: None stated PT Goal Formulation: With patient Time For Goal Achievement: 03/08/14 Potential to Achieve Goals: Good Progress towards PT goals: Progressing toward goals  Frequency  Min 3X/week    PT Plan Current plan remains appropriate    Co-evaluation             End of Session Equipment Utilized During Treatment: Gait belt Activity Tolerance: Patient limited by fatigue Patient left: in chair;with call bell/phone within reach;with family/visitor present     Time: 1128-1150 PT Time Calculation (min) (ACUTE ONLY): 22  min  Charges:  $Gait Training: 8-22 mins                    G Codes:      Conni Slipper 08-Mar-2014, 12:55 PM  Conni Slipper, PT, DPT Acute Rehabilitation Services Pager: [phone]

## 2014-03-07 NOTE — Progress Notes (Signed)
[facility] PROGRESS NOTE  Jackie Fisher:[id number] DOB: May 20, 1955 DOA: 02/24/2014 PCP: No primary care provider on file.  Admit HPI / Brief Narrative: 59yo F who presented with DKA 1/17 to [facility] ED after being found unresponsive at home by her family. Found to be in AKI, septic, and in hypoxic resp failure due to aspiration pna. Profoundly acidotic despite bicarb gtt. Was taken off insulin gtt and got hyperglycemic so was put back on it. Still having non gap acidosis despite being on bicarb drip. Sputum cx growing strep pneumo. Intubated. CT head normal.   Significant Events: 1/17 - admitted. DKA, severe sepsis. Started on vanc+zosyn. and insulin gtt, DKA protocol. Intubated for AMS 1/18 - 1/19 Gap closed but had hyperglycemia off insulin gtt so put back on it. Continued to have non gap acidosis. White count trending down. hgb dropped from 11.8 to 8-9 plt dropped from 323 --> 137. HIIT panel sent.  1/21 Extubated   Assessment/Plan: #1 DKA./New onset diabetes mellitus Patient was admitted with DKA which is currently resolved. Anion gap is closed. IV fluids have been discontinued. Hemoglobin A1c is 13.2. Patient currently on insulin 70/30 12 units twice a day. Will Discontinued meal coverage insulin. Follow.  #2 bilateral pneumonia Status post 9 days of antibiotic coverage. Antibiotics have been discontinued.  #3 acute on chronic kidney disease stage III Patient with urine output of 300 mL over the past 24 hours. Renal function with no significant improvement. Nephrology following. Nephrology recommending observation for the next 24-48 hours prior to deciding as to whether to start patient on hemodialysis.  #4 thrombocytopenia HITT panel negative. Platelets have improved. Follow.  #5 hypokalemia Repleted.  #6 diarrhea C. difficile negative. Imodium as needed.  #7 leukocytosis Questionable etiology. Patient is status post treatment for bilateral pneumonia. Patient  with no rest of her symptoms. Will check a UA with cultures and sensitivities. Follow for now.  #8 hypertension Increased Coreg to 6.25 mg by mouth twice a day. Continue Norvasc. Follow.  #9 iron deficiency anemia/anemia of chronic disease Anemia panel consistent with iron deficiency anemia.  H&H stable.   Code Status: Full Family Communication: Updated patient and sister at bedside. Disposition Plan: Home with home health when medically stable.   Consultants:  Nephrology: Dr. Eliott Nine 02/28/2014  Procedures:  Chest x-ray 02/24/2014, 02/25/2014, 02/26/2014, 02/28/2014  Abdominal x-rays 02/27/2014  Renal ultrasound 02/25/2014  CT head/CT C-spine 02/24/2014  Antibiotics:  IV Zosyn 02/24/2014>>>> 02/28/2014  IV vancomycin 02/24/2014>>>> 02/28/2014  IV Rocephin 02/28/2014>>>> 03/04/2014  IV azithromycin 02/24/2014>>>>> 02/24/2014  HPI/Subjective: Patient with no complaints. Patient states she is having good urinary output. No nausea no vomiting. Tolerating current diet.  Objective: Filed Vitals:   03/07/14 1709  BP: 158/65  Pulse: 76  Temp: 99.2 F (37.3 C)  Resp: 18    Intake/Output Summary (Last 24 hours) at 03/07/14 1756 Last data filed at 03/07/14 0800  Gross per 24 hour  Intake    360 ml  Output    150 ml  Net    210 ml   Filed Weights   03/04/14 0455 03/04/14 2055 03/05/14 2118  Weight: 85.5 kg (188 lb 7.9 oz) 86.72 kg (191 lb 2.9 oz) 85 kg (187 lb 6.3 oz)    Exam:   General:  NAD  Cardiovascular: RRR  Respiratory: CTAB  Abdomen: Soft, nontender, nondistended, positive bowel sounds  Musculoskeletal: No clubbing cyanosis or edema.  Data Reviewed: Basic Metabolic Panel:  Recent Labs Lab 03/03/14 0400 03/04/14 0500 03/05/14  1610 03/06/14 0453 03/07/14 0628  NA 139 140 139 139 137  K 3.3* 3.6 3.5 3.5 3.5  CL 98 100 100 100 99  CO2 GLUCOSE 160* 107* 288* 138* 187*  BUN 93* 88* 90* 81* 80*  CREATININE 6.53* 6.53*  6.97* 6.93* 6.69*  CALCIUM 7.9* 7.8* 7.9* 8.2* 8.1*  PHOS 4.0 5.2* 6.2* 6.4* 6.8*   Liver Function Tests:  Recent Labs Lab 03/03/14 0400 03/04/14 0500 03/05/14 0523 03/06/14 0453 03/07/14 0628  AST  --   --  19  --   --   ALT  --   --  14  --   --   ALKPHOS  --   --  153*  --   --   BILITOT  --   --  0.5  --   --   PROT  --   --  5.4*  --   --   ALBUMIN 1.5* 1.6* 1.8* 1.9* 1.9*   No results for input(s): LIPASE, AMYLASE in the last 168 hours. No results for input(s): AMMONIA in the last 168 hours. CBC:  Recent Labs Lab 03/04/14 0500 03/04/14 0900 03/05/14 0523 03/06/14 0453 03/07/14 0628  WBC 13.0* 14.0* 11.1* 13.5* 17.2*  HGB 9.3* 10.1* 9.7* 9.6* 9.3*  HCT 27.6* 30.0* 28.4* 28.5* 27.6*  MCV 88.2 90.1 91.3 89.9 89.6  PLT 375 381 382 397 374   Cardiac Enzymes: No results for input(s): CKTOTAL, CKMB, CKMBINDEX, TROPONINI in the last 168 hours. BNP (last 3 results) No results for input(s): PROBNP in the last 8760 hours. CBG:  Recent Labs Lab 03/06/14 2155 03/07/14 0244 03/07/14 0753 03/07/14 1219 03/07/14 1704  GLUCAP 224* 111* 212* 122* 132*    Recent Results (from the past 240 hour(s))  MRSA PCR Screening     Status: None   Collection Time: 02/27/14  6:27 PM  Result Value Ref Range Status   MRSA by PCR NEGATIVE NEGATIVE Final    Comment:        The GeneXpert MRSA Assay (FDA approved for NASAL specimens only), is one component of a comprehensive MRSA colonization surveillance program. It is not intended to diagnose MRSA infection nor to guide or monitor treatment for MRSA infections.   Clostridium Difficile by PCR     Status: None   Collection Time: 03/02/14  7:11 AM  Result Value Ref Range Status   C difficile by pcr NEGATIVE NEGATIVE Final     Studies: No results found.  Scheduled Meds: . amLODipine  10 mg Oral Daily  . antiseptic oral rinse  7 mL Mouth Rinse QID  . calcitRIOL  0.25 mcg Oral Daily  . carvedilol  6.25 mg Oral BID WC  .  chlorhexidine  15 mL Mouth Rinse BID  . insulin aspart  0-15 Units Subcutaneous TID AC & HS  . insulin aspart protamine- aspart  15 Units Subcutaneous BID WC   Continuous Infusions:   Principal Problem:   DKA (diabetic ketoacidoses) Active Problems:   Acute encephalopathy   Acute respiratory failure with hypoxia   Acute renal failure   Sepsis with acute organ dysfunction   Aspiration pneumonia   Metabolic acidosis   Sepsis   Altered mental state   Community acquired pneumonia   Acute respiratory failure   Hypokalemia   Leukocytosis    Time spent: 40 mins    [facility] MD [facility] Pager [phone]. If 7PM-7AM, please contact night-coverage at [email or web address], password [facility] 03/07/2014, 5:56 PM  LOS: 11 days

## 2014-03-07 NOTE — Progress Notes (Signed)
Hypoglycemic Event  CBG: 56  Treatment: 15 GM carbohydrate snack  Symptoms: Sweaty  Follow-up CBG: Time:0018 CBG Result:98  Possible Reasons for Event: Unknown  Comments/MD notified: Hypoglycemic event resolved    Star Cheese, Lyman SpellerLaura E  Remember to initiate Hypoglycemia Order Set & complete

## 2014-03-07 NOTE — Progress Notes (Signed)
Subjective: Interval History: has no complaint .  Objective: Vital signs in last 24 hours: Temp:  [97.7 F (36.5 C)-99.1 F (37.3 C)] 99.1 F (37.3 C) (01/28 0754) Pulse Rate:  [74-83] 81 (01/28 0754) Resp:  [17-19] 17 (01/28 0754) BP: (116-174)/(60-74) 159/60 mmHg (01/28 0754) SpO2:  [92 %-98 %] 92 % (01/28 0754) Weight change:   Intake/Output from previous day: 01/27 0701 - 01/28 0700 In: 780 [P.O.:780] Out: 300 [Urine:300] Intake/Output this shift: Total I/O In: 120 [P.O.:120] Out: -   General appearance: alert, cooperative and moderately obese Resp: diminished breath sounds bilaterally Cardio: S1, S2 normal and systolic murmur: systolic ejection 2/6, decrescendo at 2nd left intercostal space GI: pos bs, liver down 5 cm, soft Extremities: edema 1+  Lab Results:  Recent Labs  03/06/14 0453 03/07/14 0628  WBC 13.5* 17.2*  HGB 9.6* 9.3*  HCT 28.5* 27.6*  PLT 397 374   BMET:  Recent Labs  03/06/14 0453 03/07/14 0628  NA 139 137  K 3.5 3.5  CL 100 99  CO2 23 26  GLUCOSE 138* 187*  BUN 81* 80*  CREATININE 6.93* 6.69*  CALCIUM 8.2* 8.1*   No results for input(s): PTH in the last 72 hours. Iron Studies: No results for input(s): IRON, TIBC, TRANSFERRIN, FERRITIN in the last 72 hours.  Studies/Results: No results found.  I have reviewed the patient's current medications.  Assessment/Plan: 1 AKI  ?urine vol.  Cr a little better. Vol stable, acid/base ok. K ok.  Cannot say improvement yet .  If not better tomorrow, HD 2 DM controlled 3 HTn controlled 4 Anemia stable 5 obesity 6 sepsis resolved P Follow chem , diet, glu control      LOS: 11 days   Killian Ress L 03/07/2014,9:43 AM

## 2014-03-07 NOTE — Progress Notes (Signed)
Inpatient Diabetes Program Recommendations  AACE/ADA: New Consensus Statement on Inpatient Glycemic Control (2013)  Target Ranges:  Prepandial:   less than 140 mg/dL      Peak postprandial:   less than 180 mg/dL (1-2 hours)      Critically ill patients:  140 - 180 mg/dL   Results for Jackie Fisher, Jackie Fisher (MRN [id number]) as of 03/07/2014 10:17  Ref. Range 03/06/2014 08:35 03/06/2014 11:58 03/06/2014 14:16 03/06/2014 14:48 03/06/2014 17:42 03/06/2014 21:55 03/07/2014 02:44 03/07/2014 07:53  Glucose-Capillary Latest Range: 70-99 mg/dL 409121 (H) 85 44 (LL) 811107 (H) 268 (H) 224 (H) 111 (H) 212 (H)   Diabetes history: No Outpatient Diabetes medications: NA Current orders for Inpatient glycemic control: 70/30 12 units BID, Novolog 0-15 units ACHS  Inpatient Diabetes Program Recommendations Insulin - Basal: Please consider increasing 70/30 to 15 units BID. Insulin - Meal Coverage: Noted CBG dropped to 44 mg/dl yesterday at 91:4714:16 after patient received Novolog 3 units for meal coverage at 13:28. Agree with discontinuing meal coverage since meal coverage should not be ordered with 70/30.  Thanks, Orlando PennerMarie Maya Scholer, RN, MSN, CCRN, CDE Diabetes Coordinator Inpatient Diabetes Program [phone] (Team Pager) [phone] (AP office) [phone] [facility] office)

## 2014-03-08 DIAGNOSIS — N390 Urinary tract infection, site not specified: Secondary | ICD-10-CM | POA: Clinically undetermined

## 2014-03-08 DIAGNOSIS — N3000 Acute cystitis without hematuria: Secondary | ICD-10-CM

## 2014-03-08 LAB — RENAL FUNCTION PANEL
ALBUMIN: 1.7 g/dL — AB (ref 3.5–5.2)
ANION GAP: 7 (ref 5–15)
BUN: 73 mg/dL — AB (ref 6–23)
CALCIUM: 7.5 mg/dL — AB (ref 8.4–10.5)
CHLORIDE: 104 mmol/L (ref 96–112)
CO2: 28 mmol/L (ref 19–32)
CREATININE: 6.12 mg/dL — AB (ref 0.50–1.10)
GFR calc Af Amer: 8 mL/min — ABNORMAL LOW (ref >90)
GFR calc non Af Amer: 7 mL/min — ABNORMAL LOW (ref >90)
GLUCOSE: 139 mg/dL — AB (ref 70–99)
Phosphorus: 6.3 mg/dL — ABNORMAL HIGH (ref 2.3–4.6)
Potassium: 3.3 mmol/L — ABNORMAL LOW (ref 3.5–5.1)
SODIUM: 139 mmol/L (ref 135–145)

## 2014-03-08 LAB — GLUCOSE, CAPILLARY
GLUCOSE-CAPILLARY: 98 mg/dL (ref 70–99)
Glucose-Capillary: 137 mg/dL — ABNORMAL HIGH (ref 70–99)
Glucose-Capillary: 152 mg/dL — ABNORMAL HIGH (ref 70–99)
Glucose-Capillary: 172 mg/dL — ABNORMAL HIGH (ref 70–99)
Glucose-Capillary: 277 mg/dL — ABNORMAL HIGH (ref 70–99)
Glucose-Capillary: 282 mg/dL — ABNORMAL HIGH (ref 70–99)
Glucose-Capillary: 293 mg/dL — ABNORMAL HIGH (ref 70–99)
Glucose-Capillary: 56 mg/dL — ABNORMAL LOW (ref 70–99)
Glucose-Capillary: 97 mg/dL (ref 70–99)

## 2014-03-08 LAB — URINE MICROSCOPIC-ADD ON

## 2014-03-08 LAB — URINALYSIS, ROUTINE W REFLEX MICROSCOPIC
Bilirubin Urine: NEGATIVE
GLUCOSE, UA: NEGATIVE mg/dL
KETONES UR: NEGATIVE mg/dL
NITRITE: NEGATIVE
PROTEIN: NEGATIVE mg/dL
Specific Gravity, Urine: 1.008 (ref 1.005–1.030)
Urobilinogen, UA: 0.2 mg/dL (ref 0.0–1.0)
pH: 6 (ref 5.0–8.0)

## 2014-03-08 LAB — CBC
HEMATOCRIT: 23.5 % — AB (ref 36.0–46.0)
HEMOGLOBIN: 7.9 g/dL — AB (ref 12.0–15.0)
MCH: 30.2 pg (ref 26.0–34.0)
MCHC: 33.6 g/dL (ref 30.0–36.0)
MCV: 89.7 fL (ref 78.0–100.0)
Platelets: 283 10*3/uL (ref 150–400)
RBC: 2.62 MIL/uL — AB (ref 3.87–5.11)
RDW: 14 % (ref 11.5–15.5)
WBC: 12.3 10*3/uL — AB (ref 4.0–10.5)

## 2014-03-08 MED ORDER — INSULIN ASPART PROT & ASPART (70-30 MIX) 100 UNIT/ML ~~LOC~~ SUSP
12.0000 [IU] | Freq: Two times a day (BID) | SUBCUTANEOUS | Status: DC
  Administered 2014-03-08: 12 [IU] via SUBCUTANEOUS
  Filled 2014-03-08: qty 10

## 2014-03-08 MED ORDER — POTASSIUM CHLORIDE CRYS ER 20 MEQ PO TBCR
20.0000 meq | EXTENDED_RELEASE_TABLET | Freq: Once | ORAL | Status: AC
  Administered 2014-03-08: 20 meq via ORAL
  Filled 2014-03-08: qty 1

## 2014-03-08 MED ORDER — CEFTRIAXONE SODIUM IN DEXTROSE 20 MG/ML IV SOLN
1.0000 g | INTRAVENOUS | Status: DC
  Administered 2014-03-08 – 2014-03-09 (×2): 1 g via INTRAVENOUS
  Filled 2014-03-08 (×2): qty 50

## 2014-03-08 MED ORDER — INSULIN ASPART PROT & ASPART (70-30 MIX) 100 UNIT/ML ~~LOC~~ SUSP
15.0000 [IU] | Freq: Two times a day (BID) | SUBCUTANEOUS | Status: DC
  Administered 2014-03-08 – 2014-03-15 (×13): 15 [IU] via SUBCUTANEOUS
  Filled 2014-03-08: qty 10

## 2014-03-08 NOTE — Progress Notes (Signed)
Subjective: Interval History: has no complaint, did walk.  Objective: Vital signs in last 24 hours: Temp:  [98.6 F (37 C)-99.2 F (37.3 C)] 98.6 F (37 C) (01/29 0419) Pulse Rate:  [72-76] 72 (01/29 0419) Resp:  [17-18] 18 (01/29 0419) BP: (156-164)/(63-70) 164/63 mmHg (01/29 0419) SpO2:  [95 %-97 %] 96 % (01/29 0419) Weight:  [89.5 kg (197 lb 5 oz)] 89.5 kg (197 lb 5 oz) (01/28 2125) Weight change:   Intake/Output from previous day: 01/28 0701 - 01/29 0700 In: 120 [P.O.:120] Out: 500 [Urine:500] Intake/Output this shift:    General appearance: alert, cooperative, no distress and moderately obese Resp: diminished breath sounds bilaterally Cardio: S1, S2 normal and systolic murmur: holosystolic 2/6, blowing at apex GI: obese, pos bs,liver down 5 cm, soft Extremities: edema 1-2+  Lab Results:  Recent Labs  03/07/14 0628 03/08/14 0609  WBC 17.2* 12.3*  HGB 9.3* 7.9*  HCT 27.6* 23.5*  PLT 374 283   BMET:  Recent Labs  03/07/14 0628 03/08/14 0609  NA 137 139  K 3.5 3.3*  CL 99 104  CO2 26 28  GLUCOSE 187* 139*  BUN 80* 73*  CREATININE 6.69* 6.12*  CALCIUM 8.1* 7.5*   No results for input(s): PTH in the last 72 hours. Iron Studies: No results for input(s): IRON, TIBC, TRANSFERRIN, FERRITIN in the last 72 hours.  Studies/Results: No results found.  I have reviewed the patient's current medications.  Assessment/Plan: 1 AKI Cr improving 2 d in a row. Acid/base ok. K low,given K.  Vol xs mild, tolerable. 2 Anemia lower, follow 3 DM per primary 4 HTN on meds.  Will allow to diurese on her own and then add Lasix 5 Pneu 6 HPTH P follow chem, educate, DM control., bp meds    LOS: 12 days   Jackie Fisher L 03/08/2014,9:03 AM

## 2014-03-08 NOTE — Care Management Note (Signed)
CARE MANAGEMENT NOTE 03/08/2014  Patient:  Jackie Fisher, Jackie Fisher   Account Number:  [id number]  Date Initiated:  02/25/2014  Documentation initiated by:  CHILDRESS,JESSICA  Subjective/Objective Assessment:   Pt found unresponsive at home. Information obtained from brother. Pt lives alone independently. Pt has not been to [facility] in years but has been to [facility] in the past.     Action/Plan:   Pt is intubated and unconsious at this time. Will continue to follow for CM needs.  03/05/2014 Pt alert and able to work with PT and OT, met with pt and discussed [facility] she has transportation , appointment scheduled.   Anticipated DC Date:  03/09/2014   Anticipated DC Plan:  [place]  CM consult      Choice offered to / List presented to:             Status of service:  In process, will continue to follow Medicare Important Message given?   (If response is "NO", the following Medicare IM given date fields will be blank) Date Medicare IM given:   Medicare IM given by:   Date Additional Medicare IM given:   Additional Medicare IM given by:    Discharge Disposition:    Per UR Regulation:    If discussed at Long Length of Stay Meetings, dates discussed:   03/05/2014    Comments:   03/08/2014 Per [facility] this pt does not qualify for [facility] therefore would be required to pay for [facility] services this was discussed with family and the family has committed to provided 24/7 support for this pt until she regains her strength. CRoyal RN MPH, case manager, [phone]  02/25/2014 [place] [facility], RN, MSN, [facility] 03/07/2014 Noted orders for [facility] however pt is uninsured and we may be able to get [facility] only. If pt becomes HD dependent we can have her family apply for Medicaid and the pt could get [facility] and Social worker still no HHPT or Ethan. CRoyal RN MPH, case manager, [phone]  03/05/2014 Met with pt who varified that she is uninsured, she does have  transportation and is interested in the [employer] in [place]. Appointment scheduled and info placed in pt chart, appointment is for Wed, Mar 13, 2014 at 10am, pt will be given a list of items to bring in order to be eval for adm to the clinic.  This infor was placed in the front of the pt chart to be given to the pt at the time of d/c. Will follow for possible [facility] services for disease management at d/c. CIR following , however pt appears to be improving and may not qualify for CIR. CRoyal RN MPH, case manager, [phone]   03/01/14  12noon.  Luz Lex, RNBSN [phone] Patient states has no insurance - cannot afford diabetic meds.   patient from [facility].  Tried to talk to patient. Briefly opened eyes - only answering in grunts.  Did nod negative to not having a PCP.  Once more alert will need to try to set her up with resources closer to home.  Can get into the [facility] here in [place] but would probably not want to do that due to distance.  CM will continue to follow.  02-28-14 3:15pm Luz Lex, RNBSN [phone] Tx to cone on 1-20 due to continued acidosis and vent. Extubated today.

## 2014-03-08 NOTE — Progress Notes (Signed)
[facility] PROGRESS NOTE  Jackie Fisher ZOX:[id number] DOB: 1955-10-16 DOA: 02/24/2014 PCP: No primary care provider on file.  Admit HPI / Brief Narrative: 59yo F who presented with DKA 1/17 to [facility] ED after being found unresponsive at home by her family. Found to be in AKI, septic, and in hypoxic resp failure due to aspiration pna. Profoundly acidotic despite bicarb gtt. Was taken off insulin gtt and got hyperglycemic so was put back on it. Still having non gap acidosis despite being on bicarb drip. Sputum cx growing strep pneumo. Intubated. CT head normal.   Significant Events: 1/17 - admitted. DKA, severe sepsis. Started on vanc+zosyn. and insulin gtt, DKA protocol. Intubated for AMS 1/18 - 1/19 Gap closed but had hyperglycemia off insulin gtt so put back on it. Continued to have non gap acidosis. White count trending down. hgb dropped from 11.8 to 8-9 plt dropped from 323 --> 137. HIIT panel sent.  1/21 Extubated   Assessment/Plan: #1 DKA./New onset diabetes mellitus Patient was admitted with DKA which is currently resolved. Anion gap is closed. IV fluids have been discontinued. Hemoglobin A1c is 13.2. Patient currently on insulin 70/30 12 units twice a day. Discontinued meal coverage insulin. Follow.  #2 bilateral pneumonia Status post 9 days of antibiotic coverage. Antibiotics have been discontinued.  #3 acute on chronic kidney disease stage III Patient with urine output of 500 mL over the past 24 hours. Renal function with no significant improvement. Nephrology following. Nephrology recommending observation for the next 24-48 hours prior to deciding as to whether to start patient on hemodialysis.  #4 thrombocytopenia HITT panel negative. Platelets have improved. Follow.  #5 hypokalemia Repleted.  #6 diarrhea C. difficile negative. Imodium as needed.  #7 leukocytosis Questionable etiology. Patient is status post treatment for bilateral pneumonia. Patient with no  respiratory symptoms. Urinalysis consistent with probable UTI. Urine cultures pending. Will place empirically on IV Rocephin. Follow for now.  #8 probable UTI Urine cultures pending. Place on IV Rocephin.  #9 hypertension Increased Coreg to 6.25 mg by mouth twice a day. Continue Norvasc. Follow.  #10 iron deficiency anemia/anemia of chronic disease Anemia panel consistent with iron deficiency anemia.  H&H stable.   Code Status: Full Family Communication: Updated patient and sister at bedside. Disposition Plan: Home with home health when medically stable.   Consultants:  Nephrology: Dr. Eliott Nineunham 02/28/2014  Procedures:  Chest x-ray 02/24/2014, 02/25/2014, 02/26/2014, 02/28/2014  Abdominal x-rays 02/27/2014  Renal ultrasound 02/25/2014  CT head/CT C-spine 02/24/2014  Antibiotics:  IV Zosyn 02/24/2014>>>> 02/28/2014  IV vancomycin 02/24/2014>>>> 02/28/2014  IV Rocephin 02/28/2014>>>> 03/04/2014  IV azithromycin 02/24/2014>>>>> 02/24/2014  IV Rocephin 03/08/2014  HPI/Subjective: Patient with no complaints. Patient states she is having good urinary output. No nausea no vomiting. Tolerating current diet.  Objective: Filed Vitals:   03/08/14 1059  BP: 153/63  Pulse: 79  Temp: 98.7 F (37.1 C)  Resp: 18    Intake/Output Summary (Last 24 hours) at 03/08/14 1235 Last data filed at 03/08/14 1101  Gross per 24 hour  Intake    240 ml  Output    700 ml  Net   -460 ml   Filed Weights   03/04/14 2055 03/05/14 2118 03/07/14 2125  Weight: 86.72 kg (191 lb 2.9 oz) 85 kg (187 lb 6.3 oz) 89.5 kg (197 lb 5 oz)    Exam:   General:  NAD  Cardiovascular: RRR  Respiratory: CTAB  Abdomen: Soft, nontender, nondistended, positive bowel sounds  Musculoskeletal: No clubbing cyanosis  or edema.  Data Reviewed: Basic Metabolic Panel:  Recent Labs Lab 03/04/14 0500 03/05/14 0523 03/06/14 0453 03/07/14 0628 03/08/14 0609  NA 140 139 139 137 139  K 3.6 3.5 3.5 3.5  3.3*  CL 100 100 100 99 104  CO2 GLUCOSE 107* 288* 138* 187* 139*  BUN 88* 90* 81* 80* 73*  CREATININE 6.53* 6.97* 6.93* 6.69* 6.12*  CALCIUM 7.8* 7.9* 8.2* 8.1* 7.5*  PHOS 5.2* 6.2* 6.4* 6.8* 6.3*   Liver Function Tests:  Recent Labs Lab 03/04/14 0500 03/05/14 0523 03/06/14 0453 03/07/14 0628 03/08/14 0609  AST  --  19  --   --   --   ALT  --  14  --   --   --   ALKPHOS  --  153*  --   --   --   BILITOT  --  0.5  --   --   --   PROT  --  5.4*  --   --   --   ALBUMIN 1.6* 1.8* 1.9* 1.9* 1.7*   No results for input(s): LIPASE, AMYLASE in the last 168 hours. No results for input(s): AMMONIA in the last 168 hours. CBC:  Recent Labs Lab 03/04/14 0900 03/05/14 0523 03/06/14 0453 03/07/14 0628 03/08/14 0609  WBC 14.0* 11.1* 13.5* 17.2* 12.3*  HGB 10.1* 9.7* 9.6* 9.3* 7.9*  HCT 30.0* 28.4* 28.5* 27.6* 23.5*  MCV 90.1 91.3 89.9 89.6 89.7  PLT 381 382 397 374 283   Cardiac Enzymes: No results for input(s): CKTOTAL, CKMB, CKMBINDEX, TROPONINI in the last 168 hours. BNP (last 3 results) No results for input(s): PROBNP in the last 8760 hours. CBG:  Recent Labs Lab 03/07/14 2343 03/08/14 0018 03/08/14 0416 03/08/14 0821 03/08/14 1152  GLUCAP 56* 98 137* 152* 277*    Recent Results (from the past 240 hour(s))  MRSA PCR Screening     Status: None   Collection Time: 02/27/14  6:27 PM  Result Value Ref Range Status   MRSA by PCR NEGATIVE NEGATIVE Final    Comment:        The GeneXpert MRSA Assay (FDA approved for NASAL specimens only), is one component of a comprehensive MRSA colonization surveillance program. It is not intended to diagnose MRSA infection nor to guide or monitor treatment for MRSA infections.   Clostridium Difficile by PCR     Status: None   Collection Time: 03/02/14  7:11 AM  Result Value Ref Range Status   C difficile by pcr NEGATIVE NEGATIVE Final     Studies: No results found.  Scheduled Meds: . amLODipine  10 mg  Oral Daily  . antiseptic oral rinse  7 mL Mouth Rinse QID  . calcitRIOL  0.25 mcg Oral Daily  . carvedilol  6.25 mg Oral BID WC  . cefTRIAXone (ROCEPHIN)  IV  1 g Intravenous Q24H  . chlorhexidine  15 mL Mouth Rinse BID  . insulin aspart  0-15 Units Subcutaneous TID AC & HS  . insulin aspart protamine- aspart  12 Units Subcutaneous BID WC   Continuous Infusions:   Principal Problem:   DKA (diabetic ketoacidoses) Active Problems:   Acute encephalopathy   Acute respiratory failure with hypoxia   Acute renal failure   Sepsis with acute organ dysfunction   Aspiration pneumonia   Metabolic acidosis   Sepsis   Altered mental state   Community acquired pneumonia   Acute respiratory failure   Hypokalemia   Leukocytosis  UTI (urinary tract infection)    Time spent: 40 mins    [facility] MD [facility] Pager [phone]. If 7PM-7AM, please contact night-coverage at [email or web address], password [facility] 03/08/2014, 12:35 PM  LOS: 12 days

## 2014-03-09 DIAGNOSIS — N184 Chronic kidney disease, stage 4 (severe): Secondary | ICD-10-CM | POA: Insufficient documentation

## 2014-03-09 LAB — URINE CULTURE: Colony Count: 50000

## 2014-03-09 LAB — RENAL FUNCTION PANEL
ANION GAP: 9 (ref 5–15)
Albumin: 1.9 g/dL — ABNORMAL LOW (ref 3.5–5.2)
BUN: 80 mg/dL — ABNORMAL HIGH (ref 6–23)
CHLORIDE: 101 mmol/L (ref 96–112)
CO2: 27 mmol/L (ref 19–32)
CREATININE: 6.58 mg/dL — AB (ref 0.50–1.10)
Calcium: 8 mg/dL — ABNORMAL LOW (ref 8.4–10.5)
GFR calc non Af Amer: 6 mL/min — ABNORMAL LOW (ref >90)
GFR, EST AFRICAN AMERICAN: 7 mL/min — AB (ref >90)
Glucose, Bld: 106 mg/dL — ABNORMAL HIGH (ref 70–99)
PHOSPHORUS: 7 mg/dL — AB (ref 2.3–4.6)
POTASSIUM: 3.9 mmol/L (ref 3.5–5.1)
Sodium: 137 mmol/L (ref 135–145)

## 2014-03-09 LAB — GLUCOSE, CAPILLARY
GLUCOSE-CAPILLARY: 180 mg/dL — AB (ref 70–99)
Glucose-Capillary: 103 mg/dL — ABNORMAL HIGH (ref 70–99)
Glucose-Capillary: 197 mg/dL — ABNORMAL HIGH (ref 70–99)
Glucose-Capillary: 189 mg/dL — ABNORMAL HIGH (ref 70–99)
Glucose-Capillary: 119 mg/dL — ABNORMAL HIGH (ref 70–99)

## 2014-03-09 LAB — CBC
HCT: 25.4 % — ABNORMAL LOW (ref 36.0–46.0)
HEMOGLOBIN: 8.6 g/dL — AB (ref 12.0–15.0)
MCH: 31.3 pg (ref 26.0–34.0)
MCHC: 33.9 g/dL (ref 30.0–36.0)
MCV: 92.4 fL (ref 78.0–100.0)
Platelets: 277 10*3/uL (ref 150–400)
RBC: 2.75 MIL/uL — ABNORMAL LOW (ref 3.87–5.11)
RDW: 14 % (ref 11.5–15.5)
WBC: 12.4 10*3/uL — ABNORMAL HIGH (ref 4.0–10.5)

## 2014-03-09 NOTE — Progress Notes (Signed)
Subjective: Interval History: has no complaint.  Objective: Vital signs in last 24 hours: Temp:  [98.7 F (37.1 C)-99.1 F (37.3 C)] 98.7 F (37.1 C) (01/30 0451) Pulse Rate:  [74-79] 74 (01/30 0451) Resp:  [18-19] 18 (01/30 0451) BP: (150-171)/(57-66) 155/61 mmHg (01/30 0451) SpO2:  [95 %-97 %] 95 % (01/30 0451) Weight:  [89.3 kg (196 lb 13.9 oz)] 89.3 kg (196 lb 13.9 oz) (01/29 2018) Weight change: -0.2 kg (-7.1 oz)  Intake/Output from previous day: 01/29 0701 - 01/30 0700 In: 240 [P.O.:240] Out: 400 [Urine:400] Intake/Output this shift:    General appearance: cooperative, moderately obese and slowed mentation Resp: clear to auscultation bilaterally Cardio: S1, S2 normal and systolic murmur: holosystolic 2/6, blowing at apex GI: soft, nontender, pos bs, liver down 5 cm Extremities: edema 1+  Lab Results:  Recent Labs  03/08/14 0609 03/09/14 0500  WBC 12.3* 12.4*  HGB 7.9* 8.6*  HCT 23.5* 25.4*  PLT 283 277   BMET:  Recent Labs  03/08/14 0609 03/09/14 0500  NA 139 137  K 3.3* 3.9  CL 104 101  CO2 28 27  GLUCOSE 139* 106*  BUN 73* 80*  CREATININE 6.12* 6.58*  CALCIUM 7.5* 8.0*   No results for input(s): PTH in the last 72 hours. Iron Studies: No results for input(s): IRON, TIBC, TRANSFERRIN, FERRITIN in the last 72 hours.  Studies/Results: No results found.  I have reviewed the patient's current medications.  Assessment/Plan: 1 AKI no improvement today and little all week.  If not better tomorrow, get PC on Mon.  K/acid/base/ vol ok 2 Anemia stable to better 3 HTN mild ^, no change meds 4 Obesity 5 DM fair control P follow chem, I&O,  Epo, vit D,  Diet , edu    LOS: 13 days   Corrina Steffensen L 03/09/2014,10:05 AM

## 2014-03-09 NOTE — Progress Notes (Signed)
[facility] PROGRESS NOTE  Jackie Fisher WJX:[id number] DOB: 09-13-1955 DOA: 02/24/2014 PCP: No primary care provider on file.  Admit HPI / Brief Narrative: 59yo F who presented with DKA 1/17 to [facility] ED after being found unresponsive at home by her family. Found to be in AKI, septic, and in hypoxic resp failure due to aspiration pna. Profoundly acidotic despite bicarb gtt. Was taken off insulin gtt and got hyperglycemic so was put back on it. Still having non gap acidosis despite being on bicarb drip. Sputum cx growing strep pneumo. Intubated. CT head normal.   Significant Events: 1/17 - admitted. DKA, severe sepsis. Started on vanc+zosyn. and insulin gtt, DKA protocol. Intubated for AMS 1/18 - 1/19 Gap closed but had hyperglycemia off insulin gtt so put back on it. Continued to have non gap acidosis. White count trending down. hgb dropped from 11.8 to 8-9 plt dropped from 323 --> 137. HIIT panel sent.  1/21 Extubated   Assessment/Plan: #1 DKA./New onset diabetes mellitus Patient was admitted with DKA which is currently resolved. Anion gap is closed. IV fluids have been discontinued. Hemoglobin A1c is 13.2. Increase insulin 70/30 to 15 units twice a day. Discontinued meal coverage insulin. Follow.  #2 bilateral pneumonia Status post 9 days of antibiotic coverage. Antibiotics have been discontinued.  #3 acute on chronic kidney disease stage III Patient with urine output of 400 mL over the past 24 hours. Renal function with no significant improvement. Nephrology following. Nephrology recommending observation for the next 24-48 hours prior to deciding as to whether to start patient on hemodialysis.  #4 thrombocytopenia HITT panel negative. Platelets have improved. Follow.  #5 hypokalemia Repleted.  #6 diarrhea C. difficile negative. Imodium as needed.  #7 leukocytosis Questionable etiology. Patient is status post treatment for bilateral pneumonia. Patient with no  respiratory symptoms. Urinalysis consistent with probable UTI. Urine cultures negative. D/C IV Rocephin. Follow for now.  #8 Bacteruria Urine cultures negative. D/C IV Rocephin.  #9 hypertension Continue Coreg and Norvasc. Follow.  #10 iron deficiency anemia/anemia of chronic disease Anemia panel consistent with iron deficiency anemia.  H&H stable.   Code Status: Full Family Communication: Updated patient and sisters at bedside. Disposition Plan: Home with home health when medically stable.   Consultants:  Nephrology: Dr. Eliott Nine 02/28/2014  Procedures:  Chest x-ray 02/24/2014, 02/25/2014, 02/26/2014, 02/28/2014  Abdominal x-rays 02/27/2014  Renal ultrasound 02/25/2014  CT head/CT C-spine 02/24/2014  Antibiotics:  IV Zosyn 02/24/2014>>>> 02/28/2014  IV vancomycin 02/24/2014>>>> 02/28/2014  IV Rocephin 02/28/2014>>>> 03/04/2014  IV azithromycin 02/24/2014>>>>> 02/24/2014  IV Rocephin 03/08/2014>>>>03/09/14  HPI/Subjective: Patient with no complaints. Patient states she is having good urinary output. No nausea no vomiting. Tolerating current diet.  Objective: Filed Vitals:   03/09/14 0451  BP: 155/61  Pulse: 74  Temp: 98.7 F (37.1 C)  Resp: 18    Intake/Output Summary (Last 24 hours) at 03/09/14 1348 Last data filed at 03/08/14 1700  Gross per 24 hour  Intake      0 ml  Output    200 ml  Net   -200 ml   Filed Weights   03/05/14 2118 03/07/14 2125 03/08/14 2018  Weight: 85 kg (187 lb 6.3 oz) 89.5 kg (197 lb 5 oz) 89.3 kg (196 lb 13.9 oz)    Exam:   General:  NAD  Cardiovascular: RRR  Respiratory: CTAB  Abdomen: Soft, nontender, nondistended, positive bowel sounds  Musculoskeletal: No clubbing cyanosis or edema.  Data Reviewed: Basic Metabolic Panel:  Recent Labs Lab  03/05/14 0523 03/06/14 0453 03/07/14 0628 03/08/14 0609 03/09/14 0500  NA 139 139 137 139 137  K 3.5 3.5 3.5 3.3* 3.9  CL 100 100 99 104 101  CO2 GLUCOSE 288* 138* 187* 139* 106*  BUN 90* 81* 80* 73* 80*  CREATININE 6.97* 6.93* 6.69* 6.12* 6.58*  CALCIUM 7.9* 8.2* 8.1* 7.5* 8.0*  PHOS 6.2* 6.4* 6.8* 6.3* 7.0*   Liver Function Tests:  Recent Labs Lab 03/05/14 0523 03/06/14 0453 03/07/14 0628 03/08/14 0609 03/09/14 0500  AST 19  --   --   --   --   ALT 14  --   --   --   --   ALKPHOS 153*  --   --   --   --   BILITOT 0.5  --   --   --   --   PROT 5.4*  --   --   --   --   ALBUMIN 1.8* 1.9* 1.9* 1.7* 1.9*   No results for input(s): LIPASE, AMYLASE in the last 168 hours. No results for input(s): AMMONIA in the last 168 hours. CBC:  Recent Labs Lab 03/05/14 0523 03/06/14 0453 03/07/14 0628 03/08/14 0609 03/09/14 0500  WBC 11.1* 13.5* 17.2* 12.3* 12.4*  HGB 9.7* 9.6* 9.3* 7.9* 8.6*  HCT 28.4* 28.5* 27.6* 23.5* 25.4*  MCV 91.3 89.9 89.6 89.7 92.4  PLT 382 397 374 283 277   Cardiac Enzymes: No results for input(s): CKTOTAL, CKMB, CKMBINDEX, TROPONINI in the last 168 hours. BNP (last 3 results) No results for input(s): PROBNP in the last 8760 hours. CBG:  Recent Labs Lab 03/08/14 2015 03/08/14 2348 03/09/14 0453 03/09/14 0750 03/09/14 1148  GLUCAP 172* 97 103* 197* 180*    Recent Results (from the past 240 hour(s))  MRSA PCR Screening     Status: None   Collection Time: 02/27/14  6:27 PM  Result Value Ref Range Status   MRSA by PCR NEGATIVE NEGATIVE Final    Comment:        The GeneXpert MRSA Assay (FDA approved for NASAL specimens only), is one component of a comprehensive MRSA colonization surveillance program. It is not intended to diagnose MRSA infection nor to guide or monitor treatment for MRSA infections.   Clostridium Difficile by PCR     Status: None   Collection Time: 03/02/14  7:11 AM  Result Value Ref Range Status   C difficile by pcr NEGATIVE NEGATIVE Final  Culture, Urine     Status: None   Collection Time: 03/08/14 12:30 AM  Result Value Ref Range Status   Specimen  Description URINE, RANDOM  Final   Special Requests NONE  Final   Colony Count   Final    50,000 COLONIES/ML Performed at [facility]    Culture   Final    Multiple bacterial morphotypes present, none predominant. Suggest appropriate recollection if clinically indicated. Performed at [facility]    Report Status 03/09/2014 FINAL  Final     Studies: No results found.  Scheduled Meds: . amLODipine  10 mg Oral Daily  . antiseptic oral rinse  7 mL Mouth Rinse QID  . calcitRIOL  0.25 mcg Oral Daily  . carvedilol  6.25 mg Oral BID WC  . cefTRIAXone (ROCEPHIN)  IV  1 g Intravenous Q24H  . chlorhexidine  15 mL Mouth Rinse BID  . insulin aspart  0-15 Units Subcutaneous TID AC & HS  . insulin aspart protamine-  aspart  15 Units Subcutaneous BID WC   Continuous Infusions:   Principal Problem:   DKA (diabetic ketoacidoses) Active Problems:   Acute encephalopathy   Acute respiratory failure with hypoxia   Acute renal failure   Sepsis with acute organ dysfunction   Aspiration pneumonia   Metabolic acidosis   Sepsis   Altered mental state   Community acquired pneumonia   Acute respiratory failure   Hypokalemia   Leukocytosis   UTI (urinary tract infection)    Time spent: 40 mins    [facility] D[facility] Underwood Surgery CenterHOMPSON,DANIEL MD [facility] Pager [phone]. If 7PM-7AM, please contact night-coverage at [email or web address], password [email or web address] 03/09/2014, 1:48 PM  LOS: 13 days

## 2014-03-10 LAB — RENAL FUNCTION PANEL
Albumin: 2 g/dL — ABNORMAL LOW (ref 3.5–5.2)
Anion gap: 12 (ref 5–15)
BUN: 80 mg/dL — ABNORMAL HIGH (ref 6–23)
CALCIUM: 7.8 mg/dL — AB (ref 8.4–10.5)
CO2: 24 mmol/L (ref 19–32)
Chloride: 101 mmol/L (ref 96–112)
Creatinine, Ser: 6.39 mg/dL — ABNORMAL HIGH (ref 0.50–1.10)
GFR calc Af Amer: 7 mL/min — ABNORMAL LOW (ref >90)
GFR calc non Af Amer: 6 mL/min — ABNORMAL LOW (ref >90)
GLUCOSE: 157 mg/dL — AB (ref 70–99)
Phosphorus: 7.5 mg/dL — ABNORMAL HIGH (ref 2.3–4.6)
Potassium: 3.9 mmol/L (ref 3.5–5.1)
Sodium: 137 mmol/L (ref 135–145)

## 2014-03-10 LAB — GLUCOSE, CAPILLARY
GLUCOSE-CAPILLARY: 158 mg/dL — AB (ref 70–99)
GLUCOSE-CAPILLARY: 271 mg/dL — AB (ref 70–99)
GLUCOSE-CAPILLARY: 73 mg/dL (ref 70–99)
Glucose-Capillary: 112 mg/dL — ABNORMAL HIGH (ref 70–99)
Glucose-Capillary: 67 mg/dL — ABNORMAL LOW (ref 70–99)
Glucose-Capillary: 103 mg/dL — ABNORMAL HIGH (ref 70–99)
Glucose-Capillary: 77 mg/dL (ref 70–99)

## 2014-03-10 NOTE — Progress Notes (Signed)
Subjective: Interval History: has no complaint .  Objective: Vital signs in last 24 hours: Temp:  [98.1 F (36.7 C)-98.7 F (37.1 C)] 98.4 F (36.9 C) (01/31 0615) Pulse Rate:  [76-83] 83 (01/31 0615) Resp:  [16] 16 (01/31 0615) BP: (131-156)/(59-76) 131/76 mmHg (01/31 0615) SpO2:  [96 %-100 %] 96 % (01/31 0615) Weight:  [90.05 kg (198 lb 8.4 oz)] 90.05 kg (198 lb 8.4 oz) (01/30 2208) Weight change: 0.75 kg (1 lb 10.5 oz)  Intake/Output from previous day: 01/30 0701 - 01/31 0700 In: 720 [P.O.:720] Out: 600 [Urine:600] Intake/Output this shift:    General appearance: alert, cooperative, moderately obese and slowed mentation Resp: clear to auscultation bilaterally Cardio: S1, S2 normal and systolic murmur: holosystolic 2/6, blowing at apex GI: pos bs, liver down 6 cm soft, obese Extremities: edema 2+  Lab Results:  Recent Labs  03/08/14 0609 03/09/14 0500  WBC 12.3* 12.4*  HGB 7.9* 8.6*  HCT 23.5* 25.4*  PLT 283 277   BMET:  Recent Labs  03/09/14 0500 03/10/14 0500  NA 137 137  K 3.9 3.9  CL 101 101  CO2 27 24  GLUCOSE 106* 157*  BUN 80* 80*  CREATININE 6.58* 6.39*  CALCIUM 8.0* 7.8*   No results for input(s): PTH in the last 72 hours. Iron Studies: No results for input(s): IRON, TIBC, TRANSFERRIN, FERRITIN in the last 72 hours.  Studies/Results: No results found.  I have reviewed the patient's current medications.  Assessment/Plan: 1 AKI not much recovery and Cr without much change.  Will see if trend in am and make decision regarding access and ongoing HD.  Vol and K ok 2 DM controlled 3 HPTH on Vit D 4 HTN controlled 5 anemia stable 6 obesity P Follow chem , if not better PC And perm access    LOS: 14 days   Jene Oravec L 03/10/2014,10:25 AM

## 2014-03-10 NOTE — Progress Notes (Signed)
[facility] PROGRESS NOTE  Jackie Fisher ZOX:[id number] DOB: Jun 11, 1955 DOA: 02/24/2014 PCP: No primary care provider on file.  Admit HPI / Brief Narrative: 59yo F who presented with DKA 1/17 to [facility] ED after being found unresponsive at home by her family. Found to be in AKI, septic, and in hypoxic resp failure due to aspiration pna. Profoundly acidotic despite bicarb gtt. Was taken off insulin gtt and got hyperglycemic so was put back on it. Still having non gap acidosis despite being on bicarb drip. Sputum cx growing strep pneumo. Intubated. CT head normal.   Significant Events: 1/17 - admitted. DKA, severe sepsis. Started on vanc+zosyn. and insulin gtt, DKA protocol. Intubated for AMS 1/18 - 1/19 Gap closed but had hyperglycemia off insulin gtt so put back on it. Continued to have non gap acidosis. White count trending down. hgb dropped from 11.8 to 8-9 plt dropped from 323 --> 137. HIIT panel sent.  1/21 Extubated   Assessment/Plan: #1 DKA./New onset diabetes mellitus Patient was admitted with DKA which is currently resolved. Anion gap is closed. IV fluids have been discontinued. Hemoglobin A1c is 13.2. Continue insulin 70/30  15 units twice a day. Discontinued meal coverage insulin. Follow.  #2 bilateral pneumonia Status post 9 days of antibiotic coverage. Antibiotics have been discontinued.  #3 acute on chronic kidney disease stage III Patient with urine output of 400 mL over the past 24 hours. Renal function with no significant improvement. Nephrology following. Nephrology recommending observation for the next 24-48 hours prior to deciding as to whether to start patient on hemodialysis.  #4 thrombocytopenia HITT panel negative. Platelets have improved. Follow.  #5 hypokalemia Repleted.  #6 diarrhea C. difficile negative. Imodium as needed.  #7 leukocytosis Questionable etiology. Patient is status post treatment for bilateral pneumonia. Patient with no respiratory  symptoms. Urinalysis consistent with probable UTI. Urine cultures negative. D/C IV Rocephin. Follow for now.  #8 Bacteruria Urine cultures negative. D/C IV Rocephin.  #9 hypertension Continue Coreg and Norvasc. Follow.  #10 iron deficiency anemia/anemia of chronic disease Anemia panel consistent with iron deficiency anemia.  H&H stable.   Code Status: Full Family Communication: Updated patient and sisters at bedside. Disposition Plan: Home with home health when medically stable.   Consultants:  Nephrology: Dr. Eliott Nine 02/28/2014  Procedures:  Chest x-ray 02/24/2014, 02/25/2014, 02/26/2014, 02/28/2014  Abdominal x-rays 02/27/2014  Renal ultrasound 02/25/2014  CT head/CT C-spine 02/24/2014  Antibiotics:  IV Zosyn 02/24/2014>>>> 02/28/2014  IV vancomycin 02/24/2014>>>> 02/28/2014  IV Rocephin 02/28/2014>>>> 03/04/2014  IV azithromycin 02/24/2014>>>>> 02/24/2014  IV Rocephin 03/08/2014>>>>03/09/14  HPI/Subjective: Patient with no complaints. Patient states she is having good urinary output. No nausea no vomiting. Tolerating current diet.  Objective: Filed Vitals:   03/10/14 0615  BP: 131/76  Pulse: 83  Temp: 98.4 F (36.9 C)  Resp: 16    Intake/Output Summary (Last 24 hours) at 03/10/14 1253 Last data filed at 03/09/14 1700  Gross per 24 hour  Intake    480 ml  Output    600 ml  Net   -120 ml   Filed Weights   03/07/14 2125 03/08/14 2018 03/09/14 2208  Weight: 89.5 kg (197 lb 5 oz) 89.3 kg (196 lb 13.9 oz) 90.05 kg (198 lb 8.4 oz)    Exam:   General:  NAD  Cardiovascular: RRR  Respiratory: CTAB  Abdomen: Soft, nontender, nondistended, positive bowel sounds  Musculoskeletal: No clubbing cyanosis or edema.  Data Reviewed: Basic Metabolic Panel:  Recent Labs Lab 03/06/14 [phone]  03/07/14 0628 03/08/14 0609 03/09/14 0500 03/10/14 0500  NA 139 137 139 137 137  K 3.5 3.5 3.3* 3.9 3.9  CL 100 99 104 101 101  CO2 GLUCOSE  138* 187* 139* 106* 157*  BUN 81* 80* 73* 80* 80*  CREATININE 6.93* 6.69* 6.12* 6.58* 6.39*  CALCIUM 8.2* 8.1* 7.5* 8.0* 7.8*  PHOS 6.4* 6.8* 6.3* 7.0* 7.5*   Liver Function Tests:  Recent Labs Lab 03/05/14 0523 03/06/14 0453 03/07/14 0628 03/08/14 0609 03/09/14 0500 03/10/14 0500  AST 19  --   --   --   --   --   ALT 14  --   --   --   --   --   ALKPHOS 153*  --   --   --   --   --   BILITOT 0.5  --   --   --   --   --   PROT 5.4*  --   --   --   --   --   ALBUMIN 1.8* 1.9* 1.9* 1.7* 1.9* 2.0*   No results for input(s): LIPASE, AMYLASE in the last 168 hours. No results for input(s): AMMONIA in the last 168 hours. CBC:  Recent Labs Lab 03/05/14 0523 03/06/14 0453 03/07/14 0628 03/08/14 0609 03/09/14 0500  WBC 11.1* 13.5* 17.2* 12.3* 12.4*  HGB 9.7* 9.6* 9.3* 7.9* 8.6*  HCT 28.4* 28.5* 27.6* 23.5* 25.4*  MCV 91.3 89.9 89.6 89.7 92.4  PLT 382 397 374 283 277   Cardiac Enzymes: No results for input(s): CKTOTAL, CKMB, CKMBINDEX, TROPONINI in the last 168 hours. BNP (last 3 results) No results for input(s): PROBNP in the last 8760 hours. CBG:  Recent Labs Lab 03/09/14 1148 03/09/14 1615 03/09/14 2206 03/10/14 0810 03/10/14 1144  GLUCAP 180* 189* 119* 158* 271*    Recent Results (from the past 240 hour(s))  Clostridium Difficile by PCR     Status: None   Collection Time: 03/02/14  7:11 AM  Result Value Ref Range Status   C difficile by pcr NEGATIVE NEGATIVE Final  Culture, Urine     Status: None   Collection Time: 03/08/14 12:30 AM  Result Value Ref Range Status   Specimen Description URINE, RANDOM  Final   Special Requests NONE  Final   Colony Count   Final    50,000 COLONIES/ML Performed at [facility]    Culture   Final    Multiple bacterial morphotypes present, none predominant. Suggest appropriate recollection if clinically indicated. Performed at [facility]    Report Status 03/09/2014 FINAL  Final     Studies: No results  found.  Scheduled Meds: . amLODipine  10 mg Oral Daily  . antiseptic oral rinse  7 mL Mouth Rinse QID  . calcitRIOL  0.25 mcg Oral Daily  . carvedilol  6.25 mg Oral BID WC  . chlorhexidine  15 mL Mouth Rinse BID  . insulin aspart  0-15 Units Subcutaneous TID AC & HS  . insulin aspart protamine- aspart  15 Units Subcutaneous BID WC   Continuous Infusions:   Principal Problem:   DKA (diabetic ketoacidoses) Active Problems:   Acute encephalopathy   Acute respiratory failure with hypoxia   Acute renal failure   Sepsis with acute organ dysfunction   Aspiration pneumonia   Metabolic acidosis   Sepsis   Altered mental state   Community acquired pneumonia   Acute respiratory failure   Hypokalemia  Leukocytosis   UTI (urinary tract infection)   CKD (chronic kidney disease) stage 4, GFR 15-29 ml/min    Time spent: 40 mins    [facility] Westside HospitalHOMPSON,DANIEL MD [facility] Pager [phone]. If 7PM-7AM, please contact night-coverage at [email or web address], password [email or web address] 03/10/2014, 12:53 PM  LOS: 14 days

## 2014-03-10 NOTE — Progress Notes (Signed)
Pt complained of feeling hot and became diaphoretic. CBG check 67. 70/30 15 units already given for CBG 112. Pt was eating dinner although did not feel like eating. Drank 120 cc juice and 240 ensure. Recheck CBG 73. 2nd recheck 103. Encouraged pt to have snack at bedtime. Will continue to monitor. Hortencia ConradiWendi Cerinity Zynda, RN

## 2014-03-10 NOTE — Plan of Care (Signed)
Problem: Phase II Progression Outcomes Goal: Activity at appropriate level-compared to baseline (UP IN CHAIR FOR HEMODIALYSIS)  Outcome: Completed/Met Date Met:  03/10/14 Ambulated in hall with gait belt and front wheel walker. Tolerated well, but did stumble x 1. Continue to monitor.

## 2014-03-11 LAB — RENAL FUNCTION PANEL
ALBUMIN: 2.2 g/dL — AB (ref 3.5–5.2)
Anion gap: 11 (ref 5–15)
BUN: 81 mg/dL — ABNORMAL HIGH (ref 6–23)
CHLORIDE: 104 mmol/L (ref 96–112)
CO2: 24 mmol/L (ref 19–32)
Calcium: 8.4 mg/dL (ref 8.4–10.5)
Creatinine, Ser: 6.18 mg/dL — ABNORMAL HIGH (ref 0.50–1.10)
GFR calc Af Amer: 8 mL/min — ABNORMAL LOW (ref >90)
GFR, EST NON AFRICAN AMERICAN: 7 mL/min — AB (ref >90)
Glucose, Bld: 142 mg/dL — ABNORMAL HIGH (ref 70–99)
POTASSIUM: 3.9 mmol/L (ref 3.5–5.1)
Phosphorus: 7.8 mg/dL — ABNORMAL HIGH (ref 2.3–4.6)
SODIUM: 139 mmol/L (ref 135–145)

## 2014-03-11 LAB — GLUCOSE, CAPILLARY
GLUCOSE-CAPILLARY: 142 mg/dL — AB (ref 70–99)
GLUCOSE-CAPILLARY: 146 mg/dL — AB (ref 70–99)
Glucose-Capillary: 110 mg/dL — ABNORMAL HIGH (ref 70–99)
Glucose-Capillary: 161 mg/dL — ABNORMAL HIGH (ref 70–99)
Glucose-Capillary: 219 mg/dL — ABNORMAL HIGH (ref 70–99)

## 2014-03-11 MED ORDER — FUROSEMIDE 80 MG PO TABS
160.0000 mg | ORAL_TABLET | Freq: Two times a day (BID) | ORAL | Status: DC
  Administered 2014-03-11 – 2014-03-15 (×9): 160 mg via ORAL
  Filled 2014-03-11 (×11): qty 2

## 2014-03-11 NOTE — Progress Notes (Signed)
S: No N/V.  No sob.  Up walking some O:BP 142/58 mmHg  Pulse 74  Temp(Src) 98.5 F (36.9 C) (Oral)  Resp 17  Ht 5\' 7"  (1.702 m)  Wt 90.05 kg (198 lb 8.4 oz)  BMI 31.09 kg/m2  SpO2 98%  LMP   Intake/Output Summary (Last 24 hours) at 03/11/14 0853 Last data filed at 03/11/14 0457  Gross per 24 hour  Intake    840 ml  Output    400 ml  Net    440 ml   Weight change:  ZOX:WRUEAGen:awake and alert CVS:RRR Resp: few basilar crackles Abd:+ BS NTNT Ext: 2+ edema NEURO:CNI Ox3 no asterixis   . amLODipine  10 mg Oral Daily  . antiseptic oral rinse  7 mL Mouth Rinse QID  . calcitRIOL  0.25 mcg Oral Daily  . carvedilol  6.25 mg Oral BID WC  . chlorhexidine  15 mL Mouth Rinse BID  . insulin aspart  0-15 Units Subcutaneous TID AC & HS  . insulin aspart protamine- aspart  15 Units Subcutaneous BID WC   No results found. BMET    Component Value Date/Time   NA 139 03/11/2014 0554   K 3.9 03/11/2014 0554   CL 104 03/11/2014 0554   CO2 24 03/11/2014 0554   GLUCOSE 142* 03/11/2014 0554   BUN 81* 03/11/2014 0554   CREATININE 6.18* 03/11/2014 0554   CALCIUM 8.4 03/11/2014 0554   GFRNONAA 7* 03/11/2014 0554   GFRAA 8* 03/11/2014 0554   CBC    Component Value Date/Time   WBC 12.4* 03/09/2014 0500   RBC 2.75* 03/09/2014 0500   RBC 2.90* 02/27/2014 2100   HGB 8.6* 03/09/2014 0500   HCT 25.4* 03/09/2014 0500   PLT 277 03/09/2014 0500   MCV 92.4 03/09/2014 0500   MCH 31.3 03/09/2014 0500   MCHC 33.9 03/09/2014 0500   RDW 14.0 03/09/2014 0500   LYMPHSABS 0.8 02/24/2014 1029   MONOABS 1.0 02/24/2014 1029   EOSABS 0.0 02/24/2014 1029   BASOSABS 0.1 02/24/2014 1029     Assessment: 1.  Acute on presumably CKD, Scr slowly trending down 2.  Sec HPTH on calcitriol 3. Anemia 4. DM 5. HTN Plan: 1. Start PO lasix to increase UO and improve volume status 2. Change to renal diet for now   Jakelyn Squyres T

## 2014-03-11 NOTE — Progress Notes (Signed)
[facility] PROGRESS NOTE  Jackie BannisterLula D Fisher XBJ:[id number] DOB: 03-25-1955 DOA: 02/24/2014 PCP: No primary care provider on file.  Admit HPI / Brief Narrative: 59yo F who presented with DKA 1/17 to [facility] ED after being found unresponsive at home by her family. Found to be in AKI, septic, and in hypoxic resp failure due to aspiration pna. Profoundly acidotic despite bicarb gtt. Was taken off insulin gtt and got hyperglycemic so was put back on it. Still having non gap acidosis despite being on bicarb drip. Sputum cx growing strep pneumo. Intubated. CT head normal.   Significant Events: 1/17 - admitted. DKA, severe sepsis. Started on vanc+zosyn. and insulin gtt, DKA protocol. Intubated for AMS 1/18 - 1/19 Gap closed but had hyperglycemia off insulin gtt so put back on it. Continued to have non gap acidosis. White count trending down. hgb dropped from 11.8 to 8-9 plt dropped from 323 --> 137. HIIT panel sent.  1/21 Extubated   Assessment/Plan: #1 DKA./New onset diabetes mellitus Patient was admitted with DKA which is currently resolved. Anion gap is closed. IV fluids have been discontinued. Hemoglobin A1c is 13.2. Continue insulin 70/30  15 units twice a day. Discontinued meal coverage insulin. Follow.  #2 bilateral pneumonia Status post 9 days of antibiotic coverage. Antibiotics have been discontinued.  #3 acute on chronic kidney disease stage III Patient with urine output of 400 mL over the past 24 hours. Renal function with no significant improvement. Nephrology following. Nephrology  has started patient on oral Lasix to increase urine output and improved volume status to see whether renal function continues to improve. Nephrology following.   #4 thrombocytopenia HITT panel negative. Platelets have improved. Follow.  #5 hypokalemia Repleted.  #6 diarrhea C. difficile negative. Imodium as needed.  #7 leukocytosis Questionable etiology. Patient is status post treatment for  bilateral pneumonia. Patient with no respiratory symptoms. Urinalysis consistent with probable UTI. Urine cultures negative. D/C'd IV Rocephin. Follow for now.  #8 Bacteruria Urine cultures negative. D/C'd IV Rocephin.  #9 hypertension Continue Coreg and Norvasc. Follow.  #10 iron deficiency anemia/anemia of chronic disease Anemia panel consistent with iron deficiency anemia.  H&H stable.   Code Status: Full Family Communication: Updated patient and sisters at bedside. Disposition Plan: Home with home health when medically stable.   Consultants:  Nephrology: Dr. Eliott Fisher 02/28/2014  Procedures:  Chest x-ray 02/24/2014, 02/25/2014, 02/26/2014, 02/28/2014  Abdominal x-rays 02/27/2014  Renal ultrasound 02/25/2014  CT head/CT C-spine 02/24/2014  Antibiotics:  IV Zosyn 02/24/2014>>>> 02/28/2014  IV vancomycin 02/24/2014>>>> 02/28/2014  IV Rocephin 02/28/2014>>>> 03/04/2014  IV azithromycin 02/24/2014>>>>> 02/24/2014  IV Rocephin 03/08/2014>>>>03/09/14  HPI/Subjective: Patient with no complaints. Patient states she is having good urinary output. No nausea no vomiting. Tolerating current diet.  Objective: Filed Vitals:   03/11/14 0925  BP: 164/65  Pulse: 81  Temp: 99 F (37.2 C)  Resp: 18    Intake/Output Summary (Last 24 hours) at 03/11/14 1727 Last data filed at 03/11/14 1251  Gross per 24 hour  Intake    600 ml  Output   1450 ml  Net   -850 ml   Filed Weights   03/07/14 2125 03/08/14 2018 03/09/14 2208  Weight: 89.5 kg (197 lb 5 oz) 89.3 kg (196 lb 13.9 oz) 90.05 kg (198 lb 8.4 oz)    Exam:   General:  NAD  Cardiovascular: RRR  Respiratory: CTAB  Abdomen: Soft, nontender, nondistended, positive bowel sounds  Musculoskeletal: No clubbing cyanosis. 1-2 + BLE edema.  Data Reviewed:  Basic Metabolic Panel:  Recent Labs Lab 03/07/14 0628 03/08/14 0609 03/09/14 0500 03/10/14 0500 03/11/14 0554  NA 137 139 137 137 139  K 3.5 3.3* 3.9 3.9  3.9  CL 99 104 101 101 104  CO2 GLUCOSE 187* 139* 106* 157* 142*  BUN 80* 73* 80* 80* 81*  CREATININE 6.69* 6.12* 6.58* 6.39* 6.18*  CALCIUM 8.1* 7.5* 8.0* 7.8* 8.4  PHOS 6.8* 6.3* 7.0* 7.5* 7.8*   Liver Function Tests:  Recent Labs Lab 03/05/14 0523  03/07/14 0628 03/08/14 0609 03/09/14 0500 03/10/14 0500 03/11/14 0554  AST 19  --   --   --   --   --   --   ALT 14  --   --   --   --   --   --   ALKPHOS 153*  --   --   --   --   --   --   BILITOT 0.5  --   --   --   --   --   --   PROT 5.4*  --   --   --   --   --   --   ALBUMIN 1.8*  < > 1.9* 1.7* 1.9* 2.0* 2.2*  < > = values in this interval not displayed. No results for input(s): LIPASE, AMYLASE in the last 168 hours. No results for input(s): AMMONIA in the last 168 hours. CBC:  Recent Labs Lab 03/05/14 0523 03/06/14 0453 03/07/14 0628 03/08/14 0609 03/09/14 0500  WBC 11.1* 13.5* 17.2* 12.3* 12.4*  HGB 9.7* 9.6* 9.3* 7.9* 8.6*  HCT 28.4* 28.5* 27.6* 23.5* 25.4*  MCV 91.3 89.9 89.6 89.7 92.4  PLT 382 397 374 283 277   Cardiac Enzymes: No results for input(s): CKTOTAL, CKMB, CKMBINDEX, TROPONINI in the last 168 hours. BNP (last 3 results) No results for input(s): PROBNP in the last 8760 hours. CBG:  Recent Labs Lab 03/10/14 2140 03/11/14 0006 03/11/14 0749 03/11/14 1144 03/11/14 1659  GLUCAP 77 110* 142* 161* 146*    Recent Results (from the past 240 hour(s))  Clostridium Difficile by PCR     Status: None   Collection Time: 03/02/14  7:11 AM  Result Value Ref Range Status   C difficile by pcr NEGATIVE NEGATIVE Final  Culture, Urine     Status: None   Collection Time: 03/08/14 12:30 AM  Result Value Ref Range Status   Specimen Description URINE, RANDOM  Final   Special Requests NONE  Final   Colony Count   Final    50,000 COLONIES/ML Performed at [facility]    Culture   Final    Multiple bacterial morphotypes present, none predominant. Suggest appropriate recollection  if clinically indicated. Performed at [facility]    Report Status 03/09/2014 FINAL  Final     Studies: No results found.  Scheduled Meds: . amLODipine  10 mg Oral Daily  . antiseptic oral rinse  7 mL Mouth Rinse QID  . calcitRIOL  0.25 mcg Oral Daily  . carvedilol  6.25 mg Oral BID WC  . chlorhexidine  15 mL Mouth Rinse BID  . furosemide  160 mg Oral BID  . insulin aspart  0-15 Units Subcutaneous TID AC & HS  . insulin aspart protamine- aspart  15 Units Subcutaneous BID WC   Continuous Infusions:   Principal Problem:   DKA (diabetic ketoacidoses) Active Problems:   Acute encephalopathy   Acute respiratory failure with  hypoxia   Acute renal failure   Sepsis with acute organ dysfunction   Aspiration pneumonia   Metabolic acidosis   Sepsis   Altered mental state   Community acquired pneumonia   Acute respiratory failure   Hypokalemia   Leukocytosis   UTI (urinary tract infection)   CKD (chronic kidney disease) stage 4, GFR 15-29 ml/min    Time spent: 40 mins    [facility] MD [facility] Pager [phone]. If 7PM-7AM, please contact night-coverage at [email or web address], password [facility] 03/11/2014, 5:27 PM  LOS: 15 days

## 2014-03-11 NOTE — Progress Notes (Signed)
Physical Therapy Treatment Patient Details Name: Jackie Fisher MRN: [id number] DOB: 02-Jan-1956 Today's Date: 03/11/2014    History of Present Illness Patient is a 59 yo female admitted 02/24/14 to [facility] unresponsive.  Patient with DKA, hypoxic resp failure, asp pna bil., septic, AKI, acute encephalopathy and was intubated.  Patient [facility] on 02/27/14.  Patient extubated 02/28/14.    PT Comments    Pt limited by fatigue and reported generally feeling "bad" today. Gait training deferred as pt did not feel she could ambulate at this time. Discussed d/c plan with pt and family member, as per case management pt will not be eligible for HHPT services at d/c. Pt and family would like to pursue further rehab at the SNF level; family member states that pt continues to require increased assistance for ADL's. Unsure if pt will be able to receive further therapy services in the skilled nursing setting either, however pt will have the appropriate amount of support available for ADL's and assistance with mobility. Will continue to follow.   Follow Up Recommendations  SNF;Supervision/Assistance - 24 hour     Equipment Recommendations  Rolling walker with 5" wheels    Recommendations for Other Services       Precautions / Restrictions Precautions Precautions: Fall Restrictions Weight Bearing Restrictions: No    Mobility  Bed Mobility Overal bed mobility: Modified Independent Bed Mobility: Supine to Sit           General bed mobility comments: Increased time and use of bed rails for support, however no assist from therapist required.   Transfers Overall transfer level: Needs assistance Equipment used: Rolling walker (2 wheeled) Transfers: Sit to/from [employer] Sit to Stand: Min guard Stand pivot transfers: Min guard       General transfer comment: Pt was able to power-up to full standing with VC's for hand placement on seated surface for safety. Increased cues  for safety when going to/from [facility]  Ambulation/Gait             General Gait Details: Pt declined gait training today after transfers. States she is too tired and needs to eat breakfast because she doesn't feel well.    Stairs            Wheelchair Mobility    Modified Rankin (Stroke Patients Only)       Balance Overall balance assessment: Needs assistance Sitting-balance support: No upper extremity supported;Feet supported Sitting balance-Leahy Scale: Fair Sitting balance - Comments: Generally flexed posture                            Cognition Arousal/Alertness: Lethargic Behavior During Therapy: Flat affect Overall Cognitive Status: Impaired/Different from baseline Area of Impairment: Problem solving     Memory: Decreased short-term memory              Exercises      General Comments        Pertinent Vitals/Pain Pain Assessment: No/denies pain    Home Living                      Prior Function            PT Goals (current goals can now be found in the care plan section) Acute Rehab PT Goals Patient Stated Goal: None stated PT Goal Formulation: With patient Time For Goal Achievement: 03/15/14 Potential to Achieve Goals: Good Progress towards PT goals: Not progressing  toward goals - comment    Frequency  Min 3X/week    PT Plan Discharge plan needs to be updated    Co-evaluation             End of Session Equipment Utilized During Treatment: Gait belt Activity Tolerance: Patient limited by fatigue Patient left: in chair;with call bell/phone within reach;with family/visitor present     Time: 0742-0805 PT Time Calculation (min) (ACUTE ONLY): 23 min  Charges:  $Therapeutic Activity: 23-37 mins                    G Codes:      Conni SlipperKirkman, Ashawn Rinehart 03/11/2014, 8:32 AM  Conni SlipperLaura Danyle Boening, PT, DPT Acute Rehabilitation Services Pager: [phone]

## 2014-03-12 LAB — RENAL FUNCTION PANEL
ALBUMIN: 2.2 g/dL — AB (ref 3.5–5.2)
Anion gap: 9 (ref 5–15)
BUN: 81 mg/dL — AB (ref 6–23)
CALCIUM: 8.1 mg/dL — AB (ref 8.4–10.5)
CHLORIDE: 104 mmol/L (ref 96–112)
CO2: 25 mmol/L (ref 19–32)
CREATININE: 6.46 mg/dL — AB (ref 0.50–1.10)
GFR calc Af Amer: 7 mL/min — ABNORMAL LOW (ref >90)
GFR calc non Af Amer: 6 mL/min — ABNORMAL LOW (ref >90)
Glucose, Bld: 181 mg/dL — ABNORMAL HIGH (ref 70–99)
Phosphorus: 8.3 mg/dL — ABNORMAL HIGH (ref 2.3–4.6)
Potassium: 3.8 mmol/L (ref 3.5–5.1)
SODIUM: 138 mmol/L (ref 135–145)

## 2014-03-12 LAB — CBC
HEMATOCRIT: 23.2 % — AB (ref 36.0–46.0)
Hemoglobin: 7.9 g/dL — ABNORMAL LOW (ref 12.0–15.0)
MCH: 30.5 pg (ref 26.0–34.0)
MCHC: 34.1 g/dL (ref 30.0–36.0)
MCV: 89.6 fL (ref 78.0–100.0)
Platelets: 224 10*3/uL (ref 150–400)
RBC: 2.59 MIL/uL — AB (ref 3.87–5.11)
RDW: 13.8 % (ref 11.5–15.5)
WBC: 7 10*3/uL (ref 4.0–10.5)

## 2014-03-12 LAB — GLUCOSE, CAPILLARY
GLUCOSE-CAPILLARY: 197 mg/dL — AB (ref 70–99)
GLUCOSE-CAPILLARY: 245 mg/dL — AB (ref 70–99)
GLUCOSE-CAPILLARY: 171 mg/dL — AB (ref 70–99)
GLUCOSE-CAPILLARY: 51 mg/dL — AB (ref 70–99)
Glucose-Capillary: 219 mg/dL — ABNORMAL HIGH (ref 70–99)
Glucose-Capillary: 104 mg/dL — ABNORMAL HIGH (ref 70–99)
Glucose-Capillary: 107 mg/dL — ABNORMAL HIGH (ref 70–99)

## 2014-03-12 NOTE — Progress Notes (Signed)
NUTRITION FOLLOW UP  Intervention:   -Continue Glucerna Shake po TID PRN, each supplement provides 220 kcal and 10 grams of protein  -Encourage adequate PO intake.  Nutrition Dx:   Food and nutrition related knowledge deficit related to new onset DM diagnosis as evidenced by elevated glucose levels and HbA1c of 13.4; ongoing  Goal:   Pt to meet >/= 90% of needs through meals and supplements; met  Monitor:   Pt PO intake, weight and labs  Assessment:   Pt found unresponsive on the floor of her home. Came in with DKA, found to be in AKI, septic, hypoxic, resp failure, aspiration pna. Intubated at [facility], transferred to [facility] on 1/20.  Pt extubated on 1/21.   Pt reports her appetite is good. Meal completion is 100%. Pt requested additional information on diabetic friendly snack ideas. Snack ideas were discussed with the pt and visitor as bedside. Diabetic friendly drinks options were additionally discussed.   Labs: Low calcium and GFR. High BUN, creatinine, and phosphorous.  Height: Ht Readings from Last 1 Encounters:  03/09/14 _0  (1.702 m)    Weight Status:   Wt Readings from Last 1 Encounters:  03/11/14 198 lb 13.7 oz (90.2 kg)  02/28/14 180 lb 5.4 oz (81.8 kg) 02/25/14 163 lb 5.8 oz (74.1 kg)  Body mass index is 31.14 kg/(m^2).  Re-estimated needs:  Kcal: 1800-2000 Protein: 85-100 gm Fluid: 1.8-2 L/day  Skin: MSAD mid upper buttocks, stage II pressure ulcer on coccyx and buttocks  Diet Order: Diet renal/carb modified with 1200 ml fluid restriction   Intake/Output Summary (Last 24 hours) at 03/12/14 1015 Last data filed at 03/12/14 1000  Gross per 24 hour  Intake    520 ml  Output   2350 ml  Net  -1830 ml    Last BM: 2/1   Labs:   Recent Labs Lab 03/10/14 0500 03/11/14 0554 03/12/14 0550  NA 137 139 138  K 3.9 3.9 3.8  CL 101 104 104  CO2 _1 BUN 80* 81* 81*  CREATININE 6.39* 6.18* 6.46*  CALCIUM 7.8* 8.4 8.1*  PHOS 7.5* 7.8* 8.3*  GLUCOSE  157* 142* 181*    CBG (last 3)   Recent Labs  03/11/14 1659 03/11/14 2011 03/12/14 0749  GLUCAP 146* 219* 197*    Scheduled Meds: . amLODipine  10 mg Oral Daily  . antiseptic oral rinse  7 mL Mouth Rinse QID  . calcitRIOL  0.25 mcg Oral Daily  . carvedilol  6.25 mg Oral BID WC  . chlorhexidine  15 mL Mouth Rinse BID  . furosemide  160 mg Oral BID  . insulin aspart  0-15 Units Subcutaneous TID AC & HS  . insulin aspart protamine- aspart  15 Units Subcutaneous BID WC    Continuous Infusions:   Kallie Locks, MS, RD, LDN Pager # [phone] After hours/ weekend pager # [phone]

## 2014-03-12 NOTE — Progress Notes (Signed)
[facility] PROGRESS NOTE  SEERAT PEADEN WUJ:[id number] DOB: [birth year] DOA: 02/24/2014 PCP: No primary care provider on file.  Admit HPI / Brief Narrative: 59yo F who presented with DKA 1/17 to [facility] ED after being found unresponsive at home by her family. Found to be in AKI, septic, and in hypoxic resp failure due to aspiration pna. Profoundly acidotic despite bicarb gtt. Was taken off insulin gtt and got hyperglycemic so was put back on it. Still having non gap acidosis despite being on bicarb drip. Sputum cx growing strep pneumo. Intubated. CT head normal.   Significant Events: 1/17 - admitted. DKA, severe sepsis. Started on vanc+zosyn. and insulin gtt, DKA protocol. Intubated for AMS 1/18 - 1/19 Gap closed but had hyperglycemia off insulin gtt so put back on it. Continued to have non gap acidosis. White count trending down. hgb dropped from 11.8 to 8-9 plt dropped from 323 --> 137. HIIT panel sent.  1/21 Extubated   Assessment/Plan: #1 DKA./New onset diabetes mellitus Patient was admitted with DKA which is currently resolved. Anion gap is closed. IV fluids have been discontinued. Hemoglobin A1c is 13.2. Continue insulin 70/30  15 units twice a day. Discontinued meal coverage insulin. Follow.  #2 bilateral pneumonia Status post 9 days of antibiotic coverage. Antibiotics have been discontinued.  #3 acute on chronic kidney disease stage III Patient with urine output of 400 mL over the past 24 hours. Renal function with no significant improvement. Nephrology following. Nephrology  has started patient on oral Lasix to increase urine output and improved volume status to see whether renal function continues to improve. Nephrology following.   #4 thrombocytopenia HITT panel negative. Platelets have improved. Follow.  #5 hypokalemia Repleted.  #6 diarrhea C. difficile negative. Imodium as needed.  #7 leukocytosis Questionable etiology. Patient is status post treatment for  bilateral pneumonia. Patient with no respiratory symptoms. Urinalysis consistent with probable UTI. Urine cultures negative. D/C'd IV Rocephin. Follow for now.  #8 Bacteruria Urine cultures negative. D/C'd IV Rocephin.  #9 hypertension Continue Coreg and Norvasc. Follow.  #10 iron deficiency anemia/anemia of chronic disease Anemia panel consistent with iron deficiency anemia.  H&H stable.   Code Status: Full Family Communication: Updated patient and sisters at bedside. Disposition Plan: Home with home health vs SNF when medically stable.   Consultants:  Nephrology: Dr. Eliott Nine 02/28/2014  Procedures:  Chest x-ray 02/24/2014, 02/25/2014, 02/26/2014, 02/28/2014  Abdominal x-rays 02/27/2014  Renal ultrasound 02/25/2014  CT head/CT C-spine 02/24/2014  Antibiotics:  IV Zosyn 02/24/2014>>>> 02/28/2014  IV vancomycin 02/24/2014>>>> 02/28/2014  IV Rocephin 02/28/2014>>>> 03/04/2014  IV azithromycin 02/24/2014>>>>> 02/24/2014  IV Rocephin 03/08/2014>>>>03/09/14  HPI/Subjective: Patient with no complaints. Patient states she is having good urinary output. No nausea no vomiting. Tolerating current diet.  Objective: Filed Vitals:   03/12/14 1001  BP: 143/55  Pulse:   Temp:   Resp:     Intake/Output Summary (Last 24 hours) at 03/12/14 1227 Last data filed at 03/12/14 1149  Gross per 24 hour  Intake    520 ml  Output   3050 ml  Net  -2530 ml   Filed Weights   03/08/14 2018 03/09/14 2208 03/11/14 2011  Weight: 89.3 kg (196 lb 13.9 oz) 90.05 kg (198 lb 8.4 oz) 90.2 kg (198 lb 13.7 oz)    Exam:   General:  NAD. Flat affect.  Cardiovascular: RRR  Respiratory: CTAB  Abdomen: Soft, nontender, nondistended, positive bowel sounds  Musculoskeletal: No clubbing cyanosis. 1-2 + BLE edema.  Data Reviewed:  Basic Metabolic Panel:  Recent Labs Lab 03/08/14 0609 03/09/14 0500 03/10/14 0500 03/11/14 0554 03/12/14 0550  NA 139 137 137 139 138  K 3.3* 3.9 3.9  3.9 3.8  CL 104 101 101 104 104  CO2 28 27 24 24 25   GLUCOSE 139* 106* 157* 142* 181*  BUN 73* 80* 80* 81* 81*  CREATININE 6.12* 6.58* 6.39* 6.18* 6.46*  CALCIUM 7.5* 8.0* 7.8* 8.4 8.1*  PHOS 6.3* 7.0* 7.5* 7.8* 8.3*   Liver Function Tests:  Recent Labs Lab 03/08/14 0609 03/09/14 0500 03/10/14 0500 03/11/14 0554 03/12/14 0550  ALBUMIN 1.7* 1.9* 2.0* 2.2* 2.2*   No results for input(s): LIPASE, AMYLASE in the last 168 hours. No results for input(s): AMMONIA in the last 168 hours. CBC:  Recent Labs Lab 03/06/14 0453 03/07/14 0628 03/08/14 0609 03/09/14 0500 03/12/14 0550  WBC 13.5* 17.2* 12.3* 12.4* 7.0  HGB 9.6* 9.3* 7.9* 8.6* 7.9*  HCT 28.5* 27.6* 23.5* 25.4* 23.2*  MCV 89.9 89.6 89.7 92.4 89.6  PLT 397 374 283 277 224   Cardiac Enzymes: No results for input(s): CKTOTAL, CKMB, CKMBINDEX, TROPONINI in the last 168 hours. BNP (last 3 results) No results for input(s): PROBNP in the last 8760 hours. CBG:  Recent Labs Lab 03/11/14 1144 03/11/14 1659 03/11/14 2011 03/12/14 0749 03/12/14 1136  GLUCAP 161* 146* 219* 197* 245*    Recent Results (from the past 240 hour(s))  Culture, Urine     Status: None   Collection Time: 03/08/14 12:30 AM  Result Value Ref Range Status   Specimen Description URINE, RANDOM  Final   Special Requests NONE  Final   Colony Count   Final    50,000 COLONIES/ML Performed at [facility]    Culture   Final    Multiple bacterial morphotypes present, none predominant. Suggest appropriate recollection if clinically indicated. Performed at [facility]    Report Status 03/09/2014 FINAL  Final     Studies: No results found.  Scheduled Meds: . amLODipine  10 mg Oral Daily  . antiseptic oral rinse  7 mL Mouth Rinse QID  . calcitRIOL  0.25 mcg Oral Daily  . carvedilol  6.25 mg Oral BID WC  . chlorhexidine  15 mL Mouth Rinse BID  . furosemide  160 mg Oral BID  . insulin aspart  0-15 Units Subcutaneous TID AC & HS   . insulin aspart protamine- aspart  15 Units Subcutaneous BID WC   Continuous Infusions:   Principal Problem:   DKA (diabetic ketoacidoses) Active Problems:   Acute encephalopathy   Acute respiratory failure with hypoxia   Acute renal failure   Sepsis with acute organ dysfunction   Aspiration pneumonia   Metabolic acidosis   Sepsis   Altered mental state   Community acquired pneumonia   Acute respiratory failure   Hypokalemia   Leukocytosis   UTI (urinary tract infection)   CKD (chronic kidney disease) stage 4, GFR 15-29 ml/min    Time spent: 40 mins    [facility] LLCHOMPSON,Monnica Saltsman MD [facility] Pager [phone]. If 7PM-7AM, please contact night-coverage at [email or web address], password [email or web address] 03/12/2014, 12:27 PM  LOS: 16 days

## 2014-03-12 NOTE — Progress Notes (Signed)
S:  Eating. Smiled today!  No uremic Sxs O:BP 163/58 mmHg  Pulse 78  Temp(Src) 98.8 F (37.1 C) (Oral)  Resp 18  Ht 5\' 7"  (1.702 m)  Wt 90.2 kg (198 lb 13.7 oz)  BMI 31.14 kg/m2  SpO2 99%  LMP   Intake/Output Summary (Last 24 hours) at 03/12/14 0917 Last data filed at 03/12/14 0914  Gross per 24 hour  Intake    270 ml  Output   2350 ml  Net  -2080 ml   Weight change:  ZOX:WRUEAGen:awake and alert CVS:RRR Resp: few basilar crackles Abd:+ BS NTNT Ext: 2+ edema NEURO:CNI Ox3 no asterixis   . amLODipine  10 mg Oral Daily  . antiseptic oral rinse  7 mL Mouth Rinse QID  . calcitRIOL  0.25 mcg Oral Daily  . carvedilol  6.25 mg Oral BID WC  . chlorhexidine  15 mL Mouth Rinse BID  . furosemide  160 mg Oral BID  . insulin aspart  0-15 Units Subcutaneous TID AC & HS  . insulin aspart protamine- aspart  15 Units Subcutaneous BID WC   No results found. BMET    Component Value Date/Time   NA 138 03/12/2014 0550   K 3.8 03/12/2014 0550   CL 104 03/12/2014 0550   CO2 25 03/12/2014 0550   GLUCOSE 181* 03/12/2014 0550   BUN 81* 03/12/2014 0550   CREATININE 6.46* 03/12/2014 0550   CALCIUM 8.1* 03/12/2014 0550   GFRNONAA 6* 03/12/2014 0550   GFRAA 7* 03/12/2014 0550   CBC    Component Value Date/Time   WBC 7.0 03/12/2014 0550   RBC 2.59* 03/12/2014 0550   RBC 2.90* 02/27/2014 2100   HGB 7.9* 03/12/2014 0550   HCT 23.2* 03/12/2014 0550   PLT 224 03/12/2014 0550   MCV 89.6 03/12/2014 0550   MCH 30.5 03/12/2014 0550   MCHC 34.1 03/12/2014 0550   RDW 13.8 03/12/2014 0550   LYMPHSABS 0.8 02/24/2014 1029   MONOABS 1.0 02/24/2014 1029   EOSABS 0.0 02/24/2014 1029   BASOSABS 0.1 02/24/2014 1029     Assessment: 1.  Acute on presumably CKD, Scr not decreased today though UO good 2.  Sec HPTH on calcitriol 3. Anemia SP IV iron 4. DM 5. HTN Plan: 1. Cont lasix 2. Daily Scr  Kimberely Mccannon T

## 2014-03-12 NOTE — Progress Notes (Signed)
Occupational Therapy Treatment Patient Details Fisher: Jackie Fisher MRN: [id number] DOB: 1955-12-24 Today's Date: 03/12/2014    History of present illness Patient is a 59 yo female admitted 02/24/14 to [facility] unresponsive.  Patient with DKA, hypoxic resp failure, asp pna bil., septic, AKI, acute encephalopathy and was intubated.  Patient transferred to [facility] on 02/27/14.  Patient extubated 02/28/14.   OT comments  This 59 yo female is making progress since eval and per pt and sister in room from [facility], pt will have S/A at home. Will continue to follow to see if we can increase pt's independence prior to D/C since she does not have any insurance.  Follow Up Recommendations  Supervision/Assistance - 24 hour    Equipment Recommendations  3 in 1 bedside comode;Tub/shower bench (RW; educated pt/family that they may want to try and find these at a thrift store since pt is self-pay)       Precautions / Restrictions Precautions Precautions: Fall Restrictions Weight Bearing Restrictions: No       Mobility Bed Mobility               General bed mobility comments: Pt up in recliner upon my arrival  Transfers Overall transfer level: Needs assistance Equipment used: Rolling walker (2 wheeled) Transfers: Sit to/from Stand Sit to Stand: Supervision (from recliner)                  ADL Overall ADL's : Needs assistance/impaired     Grooming: Wash/dry [employer] manager Details (indicate cue type and reason): Min A stand>sit on regular toilet with grab bar; Mod A sit>stand from regular toilet and grab bar Toileting- Clothing Manipulation and Hygiene: Independent;Sitting/lateral lean         General ADL Comments: Educated pt and sister (from [place]) on DME pt needed at home that they may want to try and find at [employer] (RW, 3n1, tub bench)--forwarded 2 pictures of a tub bench to sister's phone at her request,  explained to pt and sister how to manage the shower curtain with the tub bench. Educated on eneergy conservation of separating tasks (ie: shower, rest, get dressed) and sitting to do a task takes less energy than standing to do a task.                Cognition   Behavior During Therapy: Flat affect Overall Cognitive Status: Within Functional Limits for tasks assessed                                    Pertinent Vitals/ Pain       Pain Assessment: No/denies pain         Frequency Min 2X/week     Progress Toward Goals  OT Goals(current goals can now be found in the care plan section)  Progress towards OT goals: Progressing toward goals     Plan Discharge plan needs to be updated       End of Session Equipment Utilized During Treatment: Rolling walker   Activity Tolerance Patient tolerated treatment well   Patient Left in chair;with call bell/phone within reach;with family/visitor present (sister from [place])           Time: 6578-4696 OT Time Calculation (min): 27 min  Charges: OT General Charges $OT Visit: 1 Procedure OT  Treatments $Self Care/Home Management : 23-37 mins  Evette GeorgesLeonard, Bern Fare Eva [phone] 03/12/2014, 12:56 PM

## 2014-03-12 NOTE — Progress Notes (Signed)
Physical Therapy Treatment Patient Details Name: Jackie BannisterLula D Fisher MRN: [id number] DOB: [birth year] Today's Date: 03/12/2014    History of Present Illness Patient is a 59 yo female admitted 02/24/14 to [facility] unresponsive.  Patient with DKA, hypoxic resp failure, asp pna bil., septic, AKI, acute encephalopathy and was intubated.  [facility].  Patient extubated 02/28/14.    PT Comments    Pt progressing slowly towards physical therapy goals. Pt was fatigued when session started, and was not able to tolerate any therapeutic exercise after gait training. Pt reports being very hungry, and focus during session for pt was on breakfast. This may have limited pt's willingness to participate once meal tray arrived. Will continue to follow and progress as able per POC.   Follow Up Recommendations  SNF;Supervision/Assistance - 24 hour     Equipment Recommendations  Rolling walker with 5" wheels    Recommendations for Other Services       Precautions / Restrictions Precautions Precautions: Fall Restrictions Weight Bearing Restrictions: No    Mobility  Bed Mobility               General bed mobility comments: Pt sitting EOB when PT arrived.   Transfers Overall transfer level: Needs assistance Equipment used: Rolling walker (2 wheeled) Transfers: Sit to/from Stand Sit to Stand: Min guard         General transfer comment: Pt was able to power-up to full standing with VC's for hand placement on seated surface for safety.   Ambulation/Gait Ambulation/Gait assistance: Min guard Ambulation Distance (Feet): 100 Feet Assistive device: Rolling walker (2 wheeled) Gait Pattern/deviations: Step-through pattern;Decreased stride length;Trunk flexed Gait velocity: Very slow Gait velocity interpretation: Below normal speed for age/gender General Gait Details: Pt required cueing for walker placement closer to pt's body and for improved posture. Pt reports being very  fatigued at end of gait training.    Stairs            Wheelchair Mobility    Modified Rankin (Stroke Patients Only)       Balance Overall balance assessment: Needs assistance Sitting-balance support: No upper extremity supported;Feet supported Sitting balance-Leahy Scale: Fair Sitting balance - Comments: Generally flexed posture   Standing balance support: Bilateral upper extremity supported;During functional activity Standing balance-Leahy Scale: Poor Standing balance comment: Pt required UE support to maintain standing balance.                     Cognition Arousal/Alertness: Lethargic Behavior During Therapy: Flat affect Overall Cognitive Status: Impaired/Different from baseline             Awareness: Emergent Problem Solving: Slow processing;Requires verbal cues      Exercises      General Comments        Pertinent Vitals/Pain Pain Assessment: No/denies pain    Home Living                      Prior Function            PT Goals (current goals can now be found in the care plan section) Acute Rehab PT Goals Patient Stated Goal: None stated PT Goal Formulation: With patient Time For Goal Achievement: 03/15/14 Potential to Achieve Goals: Good Progress towards PT goals: Progressing toward goals    Frequency  Min 3X/week    PT Plan Current plan remains appropriate    Co-evaluation  End of Session Equipment Utilized During Treatment: Gait belt Activity Tolerance: Patient limited by fatigue Patient left: in chair;with call bell/phone within reach;with family/visitor present;with nursing/sitter in room     Time: 0825-0838 PT Time Calculation (min) (ACUTE ONLY): 13 min  Charges:  $Gait Training: 8-22 mins                    G Codes:      Conni Slipper 03/18/14, 8:45 AM  Conni Slipper, PT, DPT Acute Rehabilitation Services Pager: [phone]

## 2014-03-12 NOTE — Progress Notes (Signed)
Hypoglycemic Event  CBG: 51  Treatment: 15 GM carbohydrate snack  Symptoms: Sweaty  Follow-up CBG: Time: 2309 CBG Result: 107  Possible Reasons for Event: Inadequate meal intake  Comments/MD notified: Hypoglycemic Protocol    Jackie RainwaterDang, Jackie Fisher  Remember to initiate Hypoglycemia Order Set & complete

## 2014-03-13 DIAGNOSIS — F329 Major depressive disorder, single episode, unspecified: Secondary | ICD-10-CM

## 2014-03-13 LAB — BASIC METABOLIC PANEL
Anion gap: 12 (ref 5–15)
BUN: 77 mg/dL — ABNORMAL HIGH (ref 6–23)
CO2: 25 mmol/L (ref 19–32)
CREATININE: 6.06 mg/dL — AB (ref 0.50–1.10)
Calcium: 8.3 mg/dL — ABNORMAL LOW (ref 8.4–10.5)
Chloride: 100 mmol/L (ref 96–112)
GFR calc Af Amer: 8 mL/min — ABNORMAL LOW (ref >90)
GFR calc non Af Amer: 7 mL/min — ABNORMAL LOW (ref >90)
Glucose, Bld: 222 mg/dL — ABNORMAL HIGH (ref 70–99)
POTASSIUM: 3.8 mmol/L (ref 3.5–5.1)
SODIUM: 137 mmol/L (ref 135–145)

## 2014-03-13 LAB — CBC
HCT: 25.1 % — ABNORMAL LOW (ref 36.0–46.0)
Hemoglobin: 8.5 g/dL — ABNORMAL LOW (ref 12.0–15.0)
MCH: 30 pg (ref 26.0–34.0)
MCHC: 33.9 g/dL (ref 30.0–36.0)
MCV: 88.7 fL (ref 78.0–100.0)
Platelets: 241 10*3/uL (ref 150–400)
RBC: 2.83 MIL/uL — ABNORMAL LOW (ref 3.87–5.11)
RDW: 13.5 % (ref 11.5–15.5)
WBC: 5.6 10*3/uL (ref 4.0–10.5)

## 2014-03-13 LAB — URINE MICROSCOPIC-ADD ON

## 2014-03-13 LAB — URINALYSIS, ROUTINE W REFLEX MICROSCOPIC
Bilirubin Urine: NEGATIVE
GLUCOSE, UA: 250 mg/dL — AB
Ketones, ur: NEGATIVE mg/dL
Leukocytes, UA: NEGATIVE
NITRITE: NEGATIVE
PH: 6 (ref 5.0–8.0)
Protein, ur: NEGATIVE mg/dL
Specific Gravity, Urine: 1.009 (ref 1.005–1.030)
Urobilinogen, UA: 0.2 mg/dL (ref 0.0–1.0)

## 2014-03-13 LAB — GLUCOSE, CAPILLARY
GLUCOSE-CAPILLARY: 226 mg/dL — AB (ref 70–99)
GLUCOSE-CAPILLARY: 140 mg/dL — AB (ref 70–99)
Glucose-Capillary: 184 mg/dL — ABNORMAL HIGH (ref 70–99)
Glucose-Capillary: 200 mg/dL — ABNORMAL HIGH (ref 70–99)
Glucose-Capillary: 190 mg/dL — ABNORMAL HIGH (ref 70–99)

## 2014-03-13 LAB — CBC WITH DIFFERENTIAL/PLATELET
BASOS PCT: 1 % (ref 0–1)
Basophils Absolute: 0.1 10*3/uL (ref 0.0–0.1)
EOS ABS: 0.2 10*3/uL (ref 0.0–0.7)
EOS PCT: 3 % (ref 0–5)
HEMATOCRIT: 24.8 % — AB (ref 36.0–46.0)
Hemoglobin: 8.3 g/dL — ABNORMAL LOW (ref 12.0–15.0)
Lymphocytes Relative: 11 % — ABNORMAL LOW (ref 12–46)
Lymphs Abs: 0.6 10*3/uL — ABNORMAL LOW (ref 0.7–4.0)
MCH: 30.3 pg (ref 26.0–34.0)
MCHC: 33.5 g/dL (ref 30.0–36.0)
MCV: 90.5 fL (ref 78.0–100.0)
MONO ABS: 0.3 10*3/uL (ref 0.1–1.0)
Monocytes Relative: 5 % (ref 3–12)
Neutro Abs: 4.5 10*3/uL (ref 1.7–7.7)
Neutrophils Relative %: 80 % — ABNORMAL HIGH (ref 43–77)
PLATELETS: 211 10*3/uL (ref 150–400)
RBC: 2.74 MIL/uL — ABNORMAL LOW (ref 3.87–5.11)
RDW: 13.8 % (ref 11.5–15.5)
WBC: 5.6 10*3/uL (ref 4.0–10.5)

## 2014-03-13 MED ORDER — INSULIN ASPART 100 UNIT/ML ~~LOC~~ SOLN
0.0000 [IU] | Freq: Three times a day (TID) | SUBCUTANEOUS | Status: DC
  Administered 2014-03-13: 1 [IU] via SUBCUTANEOUS
  Administered 2014-03-14: 2 [IU] via SUBCUTANEOUS
  Administered 2014-03-14: 3 [IU] via SUBCUTANEOUS
  Administered 2014-03-14 – 2014-03-15 (×2): 2 [IU] via SUBCUTANEOUS
  Administered 2014-03-15: 1 [IU] via SUBCUTANEOUS

## 2014-03-13 MED ORDER — CARVEDILOL 12.5 MG PO TABS
12.5000 mg | ORAL_TABLET | Freq: Two times a day (BID) | ORAL | Status: DC
  Administered 2014-03-13 – 2014-03-15 (×4): 12.5 mg via ORAL
  Filled 2014-03-13 (×6): qty 1

## 2014-03-13 NOTE — Progress Notes (Signed)
S:  No new CO.  No SOB  No pleuritic pain O:BP 170/67 mmHg  Pulse 79  Temp(Src) 98.7 F (37.1 C) (Oral)  Resp 18  Ht 5\' 7"  (1.702 m)  Wt 90.2 kg (198 lb 13.7 oz)  BMI 31.14 kg/m2  SpO2 97%  LMP   Intake/Output Summary (Last 24 hours) at 03/13/14 0923 Last data filed at 03/13/14 0515  Gross per 24 hour  Intake    730 ml  Output   2175 ml  Net  -1445 ml   Weight change:  WUJ:WJXBJGen:awake and alert CVS:RRR Resp: few basilar crackles, Rt pleural rub Abd:+ BS NTNT Ext: 2+ edema NEURO:CNI Ox3 no asterixis   . amLODipine  10 mg Oral Daily  . antiseptic oral rinse  7 mL Mouth Rinse QID  . calcitRIOL  0.25 mcg Oral Daily  . carvedilol  6.25 mg Oral BID WC  . chlorhexidine  15 mL Mouth Rinse BID  . furosemide  160 mg Oral BID  . insulin aspart  0-15 Units Subcutaneous TID AC & HS  . insulin aspart protamine- aspart  15 Units Subcutaneous BID WC   No results found. BMET    Component Value Date/Time   NA 137 03/13/2014 0515   K 3.8 03/13/2014 0515   CL 100 03/13/2014 0515   CO2 25 03/13/2014 0515   GLUCOSE 222* 03/13/2014 0515   BUN 77* 03/13/2014 0515   CREATININE 6.06* 03/13/2014 0515   CALCIUM 8.3* 03/13/2014 0515   GFRNONAA 7* 03/13/2014 0515   GFRAA 8* 03/13/2014 0515   CBC    Component Value Date/Time   WBC 5.6 03/13/2014 0515   RBC 2.83* 03/13/2014 0515   RBC 2.90* 02/27/2014 2100   HGB 8.5* 03/13/2014 0515   HCT 25.1* 03/13/2014 0515   PLT 241 03/13/2014 0515   MCV 88.7 03/13/2014 0515   MCH 30.0 03/13/2014 0515   MCHC 33.9 03/13/2014 0515   RDW 13.5 03/13/2014 0515   LYMPHSABS 0.8 02/24/2014 1029   MONOABS 1.0 02/24/2014 1029   EOSABS 0.0 02/24/2014 1029   BASOSABS 0.1 02/24/2014 1029     Assessment: 1.  Acute on presumably CKD, Scr sl lower 2.  Sec HPTH on calcitriol 3. Anemia SP IV iron 4. DM 5. HTN Plan: 1. Will check urine for eosinophils and SPEP to make sure something other than just ATN is not going on 2. Increase coreg 3. Recheck labs in  AM Shon Indelicato T

## 2014-03-13 NOTE — Progress Notes (Signed)
Inpatient Diabetes Program Recommendations  AACE/ADA: New Consensus Statement on Inpatient Glycemic Control (2013)  Target Ranges:  Prepandial:   less than 140 mg/dL      Peak postprandial:   less than 180 mg/dL (1-2 hours)      Critically ill patients:  140 - 180 mg/dL   Recommend decreasing Novolog to sensitive scale. Thank you  Piedad ClimesGina Clarene Curran BSN, RN,CDE Inpatient Diabetes Coordinator [phone] (team pager)

## 2014-03-13 NOTE — Progress Notes (Signed)
[facility] PROGRESS NOTE  Jackie BannisterLula D Fisher AVW:[id number] DOB: January 19, 1956 DOA: 02/24/2014 PCP: No primary care provider on file.  Admit HPI / Brief Narrative: 59yo F who presented with DKA 1/17 to [facility] after being found unresponsive at home by her family. Found to be in AKI, septic, and in hypoxic resp failure due to aspiration pna. Profoundly acidotic despite bicarb gtt. Was taken off insulin gtt and got hyperglycemic so was put back on it. Still having non gap acidosis despite being on bicarb drip. Sputum cx growing strep pneumo. Intubated. CT head normal.   Significant Events: 1/17 - admitted. DKA, severe sepsis. Started on vanc+zosyn. and insulin gtt, DKA protocol. Intubated for AMS 1/18 - 1/19 Gap closed but had hyperglycemia off insulin gtt so put back on it. Continued to have non gap acidosis. White count trending down. hgb dropped from 11.8 to 8-9 plt dropped from 323 --> 137. HIIT panel sent.  1/21 Extubated   Assessment/Plan: #1 DKA./New onset diabetes mellitus Patient was admitted with DKA which is currently resolved. Anion gap is closed. IV fluids have been discontinued. Hemoglobin A1c is 13.2. Continue insulin 70/30  15 units twice a day.  SSI changed to sensitive scale, reviewed DM coordinator note   #2 bilateral pneumonia Status post 9 days of antibiotic coverage. Antibiotics have been discontinued.  #3 acute on chronic kidney disease stage III-unchanged  check urine for eosinophils and SPEP vs  ATN   Renal function with no significant improvement. Nephrology following. Nephrology to continue oral Lasix  Reviewed renal USG 1/18 No hydronephrosis. No diagnostic renal calculus  #4 thrombocytopenia HITT panel negative. Platelets have improved. Follow.  #5 hypokalemia Repleted.  #6 diarrhea C. difficile negative. Imodium as needed.  #7 leukocytosis Questionable etiology. Patient is status post treatment for bilateral pneumonia. Patient with no respiratory  symptoms. Urinalysis consistent with probable UTI. Urine cultures negative. D/C'd IV Rocephin. Follow for now.  #8 Bacteruria Urine cultures negative. D/C'd IV Rocephin.  #9 hypertension Increase  Coreg and cont  Norvasc. Follow.  #10 iron deficiency anemia/anemia of chronic disease Anemia panel consistent with iron deficiency anemia.  H&H stable.  11; depression requested psyche  consult   Code Status: Full Family Communication: Updated patient and sisters at bedside. Disposition Plan: Home with home health vs SNF when  Cleared by nephrology  Consultants:  Nephrology: Dr. Eliott Nineunham 02/28/2014  Procedures:  Chest x-ray 02/24/2014, 02/25/2014, 02/26/2014, 02/28/2014  Abdominal x-rays 02/27/2014  Renal ultrasound 02/25/2014  CT head/CT C-spine 02/24/2014  Antibiotics:  IV Zosyn 02/24/2014>>>> 02/28/2014  IV vancomycin 02/24/2014>>>> 02/28/2014  IV Rocephin 02/28/2014>>>> 03/04/2014  IV azithromycin 02/24/2014>>>>> 02/24/2014  IV Rocephin 03/08/2014>>>>03/09/14  HPI/Subjective: cbg 51 last night, depressed , No SOB No pleuritic pain  Objective: Filed Vitals:   03/13/14 0802  BP: 170/67  Pulse: 79  Temp: 98.7 F (37.1 C)  Resp: 18    Intake/Output Summary (Last 24 hours) at 03/13/14 1345 Last data filed at 03/13/14 1023  Gross per 24 hour  Intake    600 ml  Output   1475 ml  Net   -875 ml   Filed Weights   03/08/14 2018 03/09/14 2208 03/11/14 2011  Weight: 89.3 kg (196 lb 13.9 oz) 90.05 kg (198 lb 8.4 oz) 90.2 kg (198 lb 13.7 oz)    Exam:  WGN:FAOZHGen:awake and alert CVS:RRR Resp: few basilar crackles Abd:+ BS NTNT Ext: 2+ edema NEURO:CNI Ox3 no asterixis Data Reviewed: Basic Metabolic Panel:  Recent Labs Lab 03/08/14 0609 03/09/14  0500 03/10/14 0500 03/11/14 0554 03/12/14 0550 03/13/14 0515  NA 139 137 137 139 138 137  K 3.3* 3.9 3.9 3.9 3.8 3.8  CL 104 101 101 104 104 100  CO2 GLUCOSE 139* 106* 157* 142* 181* 222*  BUN  73* 80* 80* 81* 81* 77*  CREATININE 6.12* 6.58* 6.39* 6.18* 6.46* 6.06*  CALCIUM 7.5* 8.0* 7.8* 8.4 8.1* 8.3*  PHOS 6.3* 7.0* 7.5* 7.8* 8.3*  --    Liver Function Tests:  Recent Labs Lab 03/08/14 0609 03/09/14 0500 03/10/14 0500 03/11/14 0554 03/12/14 0550  ALBUMIN 1.7* 1.9* 2.0* 2.2* 2.2*   No results for input(s): LIPASE, AMYLASE in the last 168 hours. No results for input(s): AMMONIA in the last 168 hours. CBC:  Recent Labs Lab 03/08/14 0609 03/09/14 0500 03/12/14 0550 03/13/14 0515 03/13/14 0921  WBC 12.3* 12.4* 7.0 5.6 5.6  NEUTROABS  --   --   --   --  4.5  HGB 7.9* 8.6* 7.9* 8.5* 8.3*  HCT 23.5* 25.4* 23.2* 25.1* 24.8*  MCV 89.7 92.4 89.6 88.7 90.5  PLT 283 277 224 241 211   Cardiac Enzymes: No results for input(s): CKTOTAL, CKMB, CKMBINDEX, TROPONINI in the last 168 hours. BNP (last 3 results) No results for input(s): PROBNP in the last 8760 hours. CBG:  Recent Labs Lab 03/12/14 2233 03/12/14 2309 03/13/14 0415 03/13/14 0758 03/13/14 1139  GLUCAP 51* 107* 184* 200* 226*    Recent Results (from the past 240 hour(s))  Culture, Urine     Status: None   Collection Time: 03/08/14 12:30 AM  Result Value Ref Range Status   Specimen Description URINE, RANDOM  Final   Special Requests NONE  Final   Colony Count   Final    50,000 COLONIES/ML Performed at [facility]    Culture   Final    Multiple bacterial morphotypes present, none predominant. Suggest appropriate recollection if clinically indicated. Performed at [facility]    Report Status 03/09/2014 FINAL  Final     Studies: No results found.  Scheduled Meds: . amLODipine  10 mg Oral Daily  . antiseptic oral rinse  7 mL Mouth Rinse QID  . calcitRIOL  0.25 mcg Oral Daily  . carvedilol  12.5 mg Oral BID WC  . chlorhexidine  15 mL Mouth Rinse BID  . furosemide  160 mg Oral BID  . insulin aspart  0-9 Units Subcutaneous TID WC  . insulin aspart protamine- aspart  15 Units  Subcutaneous BID WC   Continuous Infusions:   Principal Problem:   DKA (diabetic ketoacidoses) Active Problems:   Acute encephalopathy   Acute respiratory failure with hypoxia   Acute renal failure   Sepsis with acute organ dysfunction   Aspiration pneumonia   Metabolic acidosis   Sepsis   Altered mental state   Community acquired pneumonia   Acute respiratory failure   Hypokalemia   Leukocytosis   UTI (urinary tract infection)   CKD (chronic kidney disease) stage 4, GFR 15-29 ml/min    Time spent: 40 mins    [facility] MD [facility] Pager [phone]. If 7PM-7AM, please contact night-coverage at [email or web address], password [facility] 03/13/2014, 1:45 PM  LOS: 17 days

## 2014-03-14 ENCOUNTER — Inpatient Hospital Stay (HOSPITAL_COMMUNITY): Payer: MEDICAID

## 2014-03-14 DIAGNOSIS — F329 Major depressive disorder, single episode, unspecified: Secondary | ICD-10-CM | POA: Clinically undetermined

## 2014-03-14 DIAGNOSIS — F32A Depression, unspecified: Secondary | ICD-10-CM | POA: Clinically undetermined

## 2014-03-14 LAB — RENAL FUNCTION PANEL
ALBUMIN: 2.3 g/dL — AB (ref 3.5–5.2)
Anion gap: 13 (ref 5–15)
BUN: 75 mg/dL — ABNORMAL HIGH (ref 6–23)
CALCIUM: 8.4 mg/dL (ref 8.4–10.5)
CHLORIDE: 102 mmol/L (ref 96–112)
CO2: 24 mmol/L (ref 19–32)
CREATININE: 5.91 mg/dL — AB (ref 0.50–1.10)
GFR calc Af Amer: 8 mL/min — ABNORMAL LOW (ref >90)
GFR calc non Af Amer: 7 mL/min — ABNORMAL LOW (ref >90)
Glucose, Bld: 167 mg/dL — ABNORMAL HIGH (ref 70–99)
POTASSIUM: 3.2 mmol/L — AB (ref 3.5–5.1)
Phosphorus: 7.7 mg/dL — ABNORMAL HIGH (ref 2.3–4.6)
SODIUM: 139 mmol/L (ref 135–145)

## 2014-03-14 LAB — GLUCOSE, CAPILLARY
GLUCOSE-CAPILLARY: 99 mg/dL (ref 70–99)
GLUCOSE-CAPILLARY: 162 mg/dL — AB (ref 70–99)
GLUCOSE-CAPILLARY: 187 mg/dL — AB (ref 70–99)
Glucose-Capillary: 204 mg/dL — ABNORMAL HIGH (ref 70–99)
Glucose-Capillary: 133 mg/dL — ABNORMAL HIGH (ref 70–99)

## 2014-03-14 MED ORDER — BUPROPION HCL ER (SR) 150 MG PO TB12
150.0000 mg | ORAL_TABLET | Freq: Every day | ORAL | Status: DC
  Administered 2014-03-14 – 2014-03-15 (×2): 150 mg via ORAL
  Filled 2014-03-14 (×3): qty 1

## 2014-03-14 MED ORDER — POTASSIUM CHLORIDE CRYS ER 20 MEQ PO TBCR
20.0000 meq | EXTENDED_RELEASE_TABLET | Freq: Once | ORAL | Status: AC
  Administered 2014-03-14: 20 meq via ORAL
  Filled 2014-03-14: qty 1

## 2014-03-14 MED ORDER — CETYLPYRIDINIUM CHLORIDE 0.05 % MT LIQD
7.0000 mL | Freq: Two times a day (BID) | OROMUCOSAL | Status: DC
  Administered 2014-03-15: 7 mL via OROMUCOSAL

## 2014-03-14 NOTE — Progress Notes (Addendum)
Occupational Therapy Treatment Patient Details Name: Rhett BannisterLula D Shedlock MRN: [id number] DOB: May 13, 1955 Today's Date: 03/14/2014    History of present illness Patient is a 59 yo female admitted 02/24/14 to [facility] unresponsive.  Patient with DKA, hypoxic resp failure, asp pna bil., septic, AKI, acute encephalopathy and was intubated.  [facility].  Patient extubated 02/28/14.   OT comments  This 59 yo female making progress towards B/D goals, will continue to benefit from OT for this purpose and other self care tasks to go home with family at D/C with 24/7 S.  Follow Up Recommendations  Supervision/Assistance - 24 hour          Precautions / Restrictions Precautions Precautions: Fall Restrictions Weight Bearing Restrictions: No       Mobility Bed Mobility               General bed mobility comments: Pt up in recliner upon arrival  Transfers Overall transfer level: Needs assistance   Transfers: Sit to/from Stand Sit to Stand: Supervision                  ADL Overall ADL's : Needs assistance/impaired         Upper Body Bathing: Set up;Sitting   Lower Body Bathing: Set up;Supervison/ safety;Sit to/from stand   Upper Body Dressing : Set up;Sitting   Lower Body Dressing: Supervision/safety;Set up;Sit to/from stand                                  Cognition   Behavior During Therapy: Flat affect Overall Cognitive Status: Within Functional Limits for tasks assessed                                    Pertinent Vitals/ Pain       Pain Assessment: No/denies pain         Frequency Min 2X/week     Progress Toward Goals  OT Goals(current goals can now be found in the care plan section)  Progress towards OT goals: Progressing toward goals  ADL Goals Additional ADL Goal #1: Pt will be S to go around room and gather items need for BADLs at a RW level  Plan Discharge plan needs to be updated       End of  Session     Activity Tolerance Patient tolerated treatment well (did need 3 small rest breaks during B/D)   Patient Left in chair;with call bell/phone within reach;with family/visitor present   Nurse Communication          Time: [id number] OT Time Calculation (min): 26 min  Charges: OT General Charges $OT Visit: 1 Procedure OT Treatments $Self Care/Home Management : 23-37 mins  Evette GeorgesLeonard, Caroll [place] Eva [phone] 03/14/2014, 11:36 AM

## 2014-03-14 NOTE — Progress Notes (Addendum)
[facility] PROGRESS NOTE  ANTONYA LEEDER HQI:[id number] DOB: [birth year] DOA: 02/24/2014 PCP: No primary care provider on file.  Admit HPI / Brief Narrative: 59yo F who presented with DKA 1/17 to [facility] ED after being found unresponsive at home by her family. Found to be in AKI, septic, and in hypoxic resp failure due to aspiration pna. Profoundly acidotic despite bicarb gtt. Was taken off insulin gtt and got hyperglycemic so was put back on it. Still having non gap acidosis despite being on bicarb drip. Sputum cx growing strep pneumo. Intubated. CT head normal.   Significant Events: 1/17 - admitted. DKA, severe sepsis. Started on vanc+zosyn. and insulin gtt, DKA protocol. Intubated for AMS 1/18 - 1/19 Gap closed but had hyperglycemia off insulin gtt so put back on it. Continued to have non gap acidosis. White count trending down. hgb dropped from 11.8 to 8-9 plt dropped from 323 --> 137. HIIT panel sent.  1/21 Extubated   Assessment/Plan: #1 DKA./New onset diabetes mellitus Patient was admitted with DKA which is currently resolved. Anion gap is closed. IV fluids have been discontinued. Hemoglobin A1c is 13.2. Continue insulin 70/30  15 units twice a day.  Continue sensitive sliding scale CBG stable  #2 bilateral pneumonia Repeat chest x-ray pneumonia, on 2/4 Status post 9 days of antibiotic coverage. Antibiotics have been discontinued. Will not restart, no signs of pneumonia clinically  #3 acute on chronic kidney disease stage III-unchanged  Creatinine slowly improving, Nephrology to continue oral Lasix  Reviewed renal USG 1/18 No hydronephrosis. No diagnostic renal calculus  #4 thrombocytopenia HITT panel negative. Platelets have improved. Follow.  #5 hypokalemia Potassium low at 3.2 today Replete  #6 diarrhea C. difficile negative. Imodium as needed.  #7 leukocytosis Questionable etiology. Patient is status post treatment for bilateral pneumonia. Patient with no  respiratory symptoms. Urinalysis consistent with probable UTI. Urine cultures negative. D/C'd IV Rocephin. Follow for now.  #8 Bacteruria Urine cultures negative. D/C'd IV Rocephin.  #9 hypertension Increase  Coreg and cont  Norvasc. Follow.  #10 iron deficiency anemia/anemia of chronic disease Anemia panel consistent with iron deficiency anemia.  H&H stable.  11; depression psychiatry consultation pending, plan is to start Wellbutrin, will defer management to psychiatry  Code Status: Full Family Communication: Updated patient and sisters at bedside. Disposition Plan: Home with home health vs SNF when  Cleared by nephrology  Consultants:  Nephrology: Dr. Eliott Nine 02/28/2014  Procedures:  Chest x-ray 02/24/2014, 02/25/2014, 02/26/2014, 02/28/2014  Abdominal x-rays 02/27/2014  Renal ultrasound 02/25/2014  CT head/CT C-spine 02/24/2014  Antibiotics:  IV Zosyn 02/24/2014>>>> 02/28/2014  IV vancomycin 02/24/2014>>>> 02/28/2014  IV Rocephin 02/28/2014>>>> 03/04/2014  IV azithromycin 02/24/2014>>>>> 02/24/2014  IV Rocephin 03/08/2014>>>>03/09/14  HPI/Subjective: CBG stable overnight, patient still has a very flat affect, does not want to communicate, sister by the bedside, all questions answered  Objective: Filed Vitals:   03/14/14 0819  BP: 169/68  Pulse: 78  Temp: 98.7 F (37.1 C)  Resp: 18    Intake/Output Summary (Last 24 hours) at 03/14/14 1242 Last data filed at 03/14/14 0907  Gross per 24 hour  Intake    820 ml  Output   1090 ml  Net   -270 ml   Filed Weights   03/09/14 2208 03/11/14 2011 03/13/14 1800  Weight: 90.05 kg (198 lb 8.4 oz) 90.2 kg (198 lb 13.7 oz) 87.4 kg (192 lb 10.9 oz)    Exam:  XLK:GMWNU and alert CVS:RRR Resp: few basilar crackles Abd:+ BS NTNT Ext:  2+ edema NEURO:CNI Ox3 no asterixis Data Reviewed: Basic Metabolic Panel:  Recent Labs Lab 03/09/14 0500 03/10/14 0500 03/11/14 0554 03/12/14 0550 03/13/14 0515  03/14/14 0500  NA 137 137 139 138 137 139  K 3.9 3.9 3.9 3.8 3.8 3.2*  CL 101 101 104 104 100 102  CO2 GLUCOSE 106* 157* 142* 181* 222* 167*  BUN 80* 80* 81* 81* 77* 75*  CREATININE 6.58* 6.39* 6.18* 6.46* 6.06* 5.91*  CALCIUM 8.0* 7.8* 8.4 8.1* 8.3* 8.4  PHOS 7.0* 7.5* 7.8* 8.3*  --  7.7*   Liver Function Tests:  Recent Labs Lab 03/09/14 0500 03/10/14 0500 03/11/14 0554 03/12/14 0550 03/14/14 0500  ALBUMIN 1.9* 2.0* 2.2* 2.2* 2.3*   No results for input(s): LIPASE, AMYLASE in the last 168 hours. No results for input(s): AMMONIA in the last 168 hours. CBC:  Recent Labs Lab 03/08/14 0609 03/09/14 0500 03/12/14 0550 03/13/14 0515 03/13/14 0921  WBC 12.3* 12.4* 7.0 5.6 5.6  NEUTROABS  --   --   --   --  4.5  HGB 7.9* 8.6* 7.9* 8.5* 8.3*  HCT 23.5* 25.4* 23.2* 25.1* 24.8*  MCV 89.7 92.4 89.6 88.7 90.5  PLT 283 277 224 241 211   Cardiac Enzymes: No results for input(s): CKTOTAL, CKMB, CKMBINDEX, TROPONINI in the last 168 hours. BNP (last 3 results) No results for input(s): PROBNP in the last 8760 hours. CBG:  Recent Labs Lab 03/13/14 1558 03/13/14 2103 03/14/14 0321 03/14/14 0815 03/14/14 1107  GLUCAP 140* 190* 99 162* 187*    Recent Results (from the past 240 hour(s))  Culture, Urine     Status: None   Collection Time: 03/08/14 12:30 AM  Result Value Ref Range Status   Specimen Description URINE, RANDOM  Final   Special Requests NONE  Final   Colony Count   Final    50,000 COLONIES/ML Performed at [facility]    Culture   Final    Multiple bacterial morphotypes present, none predominant. Suggest appropriate recollection if clinically indicated. Performed at [facility]    Report Status 03/09/2014 FINAL  Final     Studies: Dg Chest 2 View  03/14/2014   CLINICAL DATA:  Patient was found unresponsive, hypoglycemia, mental status change, acute renal failure, recent pneumonia  EXAM: CHEST  2 VIEW  COMPARISON:   Portable chest x-ray of February 28, 2014.  FINDINGS: The lungs are adequately inflated. There are persistent coarse lung markings in the right hilar and infrahilar regions. There is persistent increased density in the left lower lobe partially obscuring the costophrenic angle. The cardiac silhouette is mildly enlarged. The central pulmonary vascularity is prominent. There is a small left pleural effusion. The right-sided PICC line tip projects over the right atrium.  IMPRESSION: 1. There has been some improvement in the appearance of the pulmonary interstitium but right middle and left lower lobe pneumonia persists. Superimposed low-grade CHF is present. There is a small left pleural effusion. 2. The right-sided PICC line tip projects over the right atrium. Withdrawal by 5cm is recommended.   Electronically Signed   By: David  [place]   On: 03/14/2014 12:35    Scheduled Meds: . amLODipine  10 mg Oral Daily  . antiseptic oral rinse  7 mL Mouth Rinse QID  . buPROPion  150 mg Oral Daily  . calcitRIOL  0.25 mcg Oral Daily  . carvedilol  12.5 mg Oral BID WC  . chlorhexidine  15 mL  Mouth Rinse BID  . furosemide  160 mg Oral BID  . insulin aspart  0-9 Units Subcutaneous TID WC  . insulin aspart protamine- aspart  15 Units Subcutaneous BID WC   Continuous Infusions:   Principal Problem:   Depression Active Problems:   Acute encephalopathy   DKA (diabetic ketoacidoses)   Acute respiratory failure with hypoxia   Acute renal failure   Sepsis with acute organ dysfunction   Aspiration pneumonia   Metabolic acidosis   Sepsis   Altered mental state   Community acquired pneumonia   Acute respiratory failure   Hypokalemia   Leukocytosis   UTI (urinary tract infection)   CKD (chronic kidney disease) stage 4, GFR 15-29 ml/min    Time spent: 40 mins    [facility],Galilea Quito MD [facility] Pager [phone]. If 7PM-7AM, please contact night-coverage at [email or web address], password [email or web address] 03/14/2014, 12:42 PM   LOS: 18 days

## 2014-03-14 NOTE — Progress Notes (Signed)
S:  No new CO.  No SOB  No pleuritic pain O:BP 169/68 mmHg  Pulse 78  Temp(Src) 98.7 F (37.1 C) (Oral)  Resp 18  Ht 5\' 7"  (1.702 m)  Wt 87.4 kg (192 lb 10.9 oz)  BMI 30.17 kg/m2  SpO2 98%  LMP   Intake/Output Summary (Last 24 hours) at 03/14/14 0928 Last data filed at 03/14/14 [id number]  Gross per 24 hour  Intake    940 ml  Output   1290 ml  Net   -350 ml   Weight change:  RUE:AVWUJGen:awake and alert CVS:RRR Resp: few basilar crackles, Rt pleural rub Abd:+ BS NTND Ext: 2+ edema NEURO:CNI Ox3 no asterixis   . amLODipine  10 mg Oral Daily  . antiseptic oral rinse  7 mL Mouth Rinse QID  . calcitRIOL  0.25 mcg Oral Daily  . carvedilol  12.5 mg Oral BID WC  . chlorhexidine  15 mL Mouth Rinse BID  . furosemide  160 mg Oral BID  . insulin aspart  0-9 Units Subcutaneous TID WC  . insulin aspart protamine- aspart  15 Units Subcutaneous BID WC   No results found. BMET    Component Value Date/Time   NA 139 03/14/2014 0500   K 3.2* 03/14/2014 0500   CL 102 03/14/2014 0500   CO2 24 03/14/2014 0500   GLUCOSE 167* 03/14/2014 0500   BUN 75* 03/14/2014 0500   CREATININE 5.91* 03/14/2014 0500   CALCIUM 8.4 03/14/2014 0500   GFRNONAA 7* 03/14/2014 0500   GFRAA 8* 03/14/2014 0500   CBC    Component Value Date/Time   WBC 5.6 03/13/2014 0921   RBC 2.74* 03/13/2014 0921   RBC 2.90* 02/27/2014 2100   HGB 8.3* 03/13/2014 0921   HCT 24.8* 03/13/2014 0921   PLT 211 03/13/2014 0921   MCV 90.5 03/13/2014 0921   MCH 30.3 03/13/2014 0921   MCHC 33.5 03/13/2014 0921   RDW 13.8 03/13/2014 0921   LYMPHSABS 0.6* 03/13/2014 0921   MONOABS 0.3 03/13/2014 0921   EOSABS 0.2 03/13/2014 0921   BASOSABS 0.1 03/13/2014 0921     Assessment: 1.  Acute on presumably CKD, Scr sl lower 2.  Sec HPTH on calcitriol 3. Anemia SP IV iron 4. DM 5. HTN 6. hypokalemia Plan: 1. She is agreeable to inpt rehab or SNF.  She agrees that she is unable to care for herself right now 2. Recheck labs in AM 3.  Replace K 3.  She still has a pleural rub, will recheck CXR Genelle Economou T

## 2014-03-14 NOTE — Consult Note (Signed)
[facility]   Reason for Consult:  Depression and multiple medical problems Referring Physician:  Dr. Isidoro Donningai  Patient Identification: Jackie BannisterLula D Fisher MRN:  [id number] Principal Diagnosis: Depression Diagnosis:   Patient Active Problem List   Diagnosis Date Noted  . Depression [F32.9] 03/14/2014  . CKD (chronic kidney disease) stage 4, GFR 15-29 ml/min [N18.4]   . UTI (urinary tract infection) [N39.0] 03/08/2014  . Leukocytosis [D72.829] 03/07/2014  . Hypokalemia [E87.6]   . Altered mental state [R41.82]   . Community acquired pneumonia [J18.9]   . Acute respiratory failure [J96.00]   . Acute encephalopathy [G93.40] 02/24/2014  . DKA (diabetic ketoacidoses) [E13.10] 02/24/2014  . Acute respiratory failure with hypoxia [J96.01] 02/24/2014  . Acute renal failure [N17.9] 02/24/2014  . Sepsis with acute organ dysfunction [A41.9, R65.20] 02/24/2014  . Aspiration pneumonia [J69.0] 02/24/2014  . Metabolic acidosis [E87.2] 02/24/2014  . Sepsis [A41.9] 02/24/2014  . Diabetic ketoacidosis with coma associated with diabetes mellitus due to underlying condition [E08.11]     Total Time spent with patient: 45 minutes  Subjective:   Jackie Fisher is a 59 y.o. female patient admitted with AMS and depression.  HPI:  Jackie ReddenLula  Fisher is a 59 years old female seen, chart reviewed for psychiatric consultation and medication management of depression and increase his psychosocial stressors. Patient reported she has been struggling with lack of support, financial difficulties which made her depressed, sad, tearful, isolation, less socialization which resulted not able to care for herself resulted developing diabetic ketoacidosis and altered mental status at the time of admission. Patient lives by herself in a house and not able to make money even though able to work with the [employer] and has no insurance which resulted not able to care for her diabetes. Patient has 3 sisters in out  of town reportedly supportive to her but not able to receive support from them. Patient stated that "I can do like I'm able to do before". Patient denies current suicidal, homicidal ideation, intention or plans. Patient denied family history of mental illness, substance abuse and suicidal attempts. Patient contract for safety while in the hospital and willing to receive outpatient psychiatric services including medication management. Patient consented for antidepressant medication Wellbutrin SR at this time. Patient's sister from [place] visiting her who seems to be supportive to her.  Medical history: Patient presented with altered mental status, tachypneic, tachycardic, severe sepsis, b/l PNA, DKA with severe metabolic acidosis. Was intubated in ED. Thought to have developed DKA >> encephalopathy >> asp pna >> sepsis. Was placed on insulin gtt, abx, and pulm and nephro consulted. hgba1c 13.2. AKI failed to improve with IV fluids. CT head done for ams., neg. Had high anion gap acidosis which resolved. Now has NAGAcidosis. Also had anemia and thrombocytopenia. Per discussion with family today, she was doing well before Saturday. On Saturday 1/16 she was having some cough and cold symptoms, her brother was sick who lives next to her. Then on Sunday she was walking back from her bathroom and was found down on the floor in her house by her brother who was incidentally checking on her. Has hx of diabetes per sister but was not taking any medication per sisters. No other medical problems.   HPI Elements:   Location:  Depression. Quality:  Poor. Severity:  Moderate due to increased psychomotor retardation. Timing:  Unable to care for herself for a few weeks 0. Duration:  Few weeks. Context:  Multiple psychosocial stressors.  Past  Medical History:  Past Medical History  Diagnosis Date  . Hypertension   . Diabetes mellitus without complication     Past Surgical History  Procedure Laterality Date  .  Abdominal hysterectomy     Family History:  Family History  Problem Relation Age of Onset  . Family history unknown: Yes   Social History:  History  Alcohol Use No     History  Drug Use No    History   Social History  . Marital Status: Single    Spouse Name: N/A    Number of Children: N/A  . Years of Education: N/A   Social History Main Topics  . Smoking status: Never Smoker   . Smokeless tobacco: None  . Alcohol Use: No  . Drug Use: No  . Sexual Activity: No   Other Topics Concern  . None   Social History Narrative   Additional Social History:         Allergies:  No Known Allergies  Vitals: Blood pressure 169/68, pulse 78, temperature 98.7 F (37.1 C), temperature source Oral, resp. rate 18, height  (1.702 m), weight 87.4 kg (192 lb 10.9 oz), SpO2 98 %.  Risk to Self: Is patient at risk for suicide?: No Risk to Others:   Prior Inpatient Therapy:   Prior Outpatient Therapy:    Current Facility-Administered Medications  Medication Dose Route Frequency Provider Last Rate Last Dose  . acetaminophen (TYLENOL) solution 650 mg  650 mg Oral Q4H PRN Lonia Blood, MD      . albuterol (PROVENTIL) (2.5 MG/3ML) 0.083% nebulizer solution 2.5 mg  2.5 mg Nebulization Q2H PRN Erick Blinks, MD      . amLODipine (NORVASC) tablet 10 mg  10 mg Oral Daily Lonia Blood, MD   10 mg at 03/13/14 1038  . antiseptic oral rinse (CPC / CETYLPYRIDINIUM CHLORIDE 0.05%) solution 7 mL  7 mL Mouth Rinse QID Erick Blinks, MD   7 mL at 03/13/14 1600  . calcitRIOL (ROCALTROL) capsule 0.25 mcg  0.25 mcg Oral Daily Trevor Iha, MD   0.25 mcg at 03/13/14 1038  . carvedilol (COREG) tablet 12.5 mg  12.5 mg Oral BID WC Dyke Maes, MD   12.5 mg at 03/14/14 0846  . chlorhexidine (PERIDEX) 0.12 % solution 15 mL  15 mL Mouth Rinse BID Erick Blinks, MD   15 mL at 03/14/14 0852  . feeding supplement (GLUCERNA SHAKE) (GLUCERNA SHAKE) liquid 237 mL  237 mL Oral TID BM PRN  Lonia Blood, MD   237 mL at 03/10/14 1802  . furosemide (LASIX) tablet 160 mg  160 mg Oral BID Dyke Maes, MD   160 mg at 03/14/14 0846  . insulin aspart (novoLOG) injection 0-9 Units  0-9 Units Subcutaneous TID WC Richarda Overlie, MD   2 Units at 03/14/14 0848  . insulin aspart protamine- aspart (NOVOLOG MIX 70/30) injection 15 Units  15 Units Subcutaneous BID WC Rodolph Bong, MD   15 Units at 03/14/14 0848  . loperamide (IMODIUM) capsule 2 mg  2 mg Oral PRN Lonia Blood, MD   2 mg at 03/03/14 1230  . menthol-cetylpyridinium (CEPACOL) lozenge 3 mg  1 lozenge Oral PRN Roma Kayser Schorr, NP   3 mg at 03/04/14 0456  . ondansetron (ZOFRAN) injection 4 mg  4 mg Intravenous Q6H PRN Erick Blinks, MD      . phenol (CHLORASEPTIC) mouth spray 1 spray  1 spray Mouth/Throat PRN Roma Kayser  Schorr, NP   1 spray at 03/04/14 0455  . sodium chloride 0.9 % injection 10-40 mL  10-40 mL Intracatheter PRN Erick Blinks, MD   10 mL at 03/13/14 0515    Musculoskeletal: Strength & Muscle Tone: decreased Gait & Station: unable to stand Patient leans: N/A  Psychiatric Specialty Exam: Physical Exam as per history and physical   ROS depression, anxiety and multiple psychosocial stresses   Blood pressure 169/68, pulse 78, temperature 98.7 F (37.1 C), temperature source Oral, resp. rate 18, height  (1.702 m), weight 87.4 kg (192 lb 10.9 oz), SpO2 98 %.Body mass index is 30.17 kg/(m^2).  General Appearance: Disheveled and Guarded  Eye Contact::  Good  Speech:  Clear and Coherent and Slow  Volume:  Decreased  Mood:  Dysphoric  Affect:  Depressed and Tearful  Thought Process:  Coherent and Goal Directed  Orientation:  Full (Time, Place, and Person)  Thought Content:  Rumination  Suicidal Thoughts:  No  Homicidal Thoughts:  No  Memory:  Immediate;   Fair Recent;   Fair  Judgement:  Impaired  Insight:  Lacking  Psychomotor Activity:  Decreased  Concentration:  Fair  Recall:  Good   Fund of Knowledge:Fair  Language: Good  Akathisia:  NA  Handed:  Right  AIMS (if indicated):     Assets:  Communication Skills Desire for Improvement Housing Leisure Time Resilience Social Support Talents/Skills  ADL's:  Impaired  Cognition: Impaired,  Mild   Sleep:      Medical Decision Making: Review of Psycho-Social Stressors (1), Established Problem, Worsening (2), New Problem, with no additional work-up planned (3), Review of Medication Regimen & Side Effects (2) and Review of New Medication or Change in Dosage (2)  Treatment Plan Summary: Daily contact with patient to assess and evaluate symptoms and progress in treatment and Medication management  Plan: Start Wellbutrin SR 150 mg PO QD for depression  No evidence of imminent risk to self or others at present.   Patient does not meet criteria for psychiatric inpatient admission. Supportive therapy provided about ongoing stressors.  Appreciate psychiatric consultation and follow up as clinically required Please contact [phone] or [phone] if needs further assistance  Disposition: Patient will be referred to the outpatient psychiatric services and medically stable  Ziasia Lenoir,JANARDHAHA R. 03/14/2014 9:21 AM

## 2014-03-14 NOTE — Progress Notes (Signed)
Physical Therapy Treatment Patient Details Name: Jackie BannisterLula D Fisher MRN: [id number] DOB: [birth year] Today's Date: 03/14/2014    History of Present Illness Patient is a 59 yo female admitted 02/24/14 to [facility] unresponsive.  Patient with DKA, hypoxic resp failure, asp pna bil., septic, AKI, acute encephalopathy and was intubated.  [facility].  Patient extubated 02/28/14.    PT Comments    Pt making good progress towards physical therapy goals. Pt is able to perform transfers with no physical assistance. Tolerance for functional activity is improved, and pt is able to ambulate up to 240' at a time with RW and guarding for safety. Pt continues to have a very flat affect during sessions, and it is difficult to tell the patient's confidence level with mobility, as well as to judge fatigue level. From a PT perspective pt is doing well enough for d/c home with family assist if they can still provide 24 hour supervision.   Follow Up Recommendations  Home health PT;Supervision/Assistance - 24 hour     Equipment Recommendations  Rolling walker with 5" wheels    Recommendations for Other Services       Precautions / Restrictions Precautions Precautions: Fall Restrictions Weight Bearing Restrictions: No    Mobility  Bed Mobility               General bed mobility comments: Pt sitting up in recliner upon PT arrival.   Transfers Overall transfer level: Needs assistance Equipment used: Rolling walker (2 wheeled) Transfers: Sit to/from Stand Sit to Stand: Supervision         General transfer comment: Pt was able to power-up to full standing with VC's for hand placement on seated surface for safety.   Ambulation/Gait Ambulation/Gait assistance: Min guard;Supervision Ambulation Distance (Feet): 200 Feet (followed by 240' on second bout) Assistive device: Rolling walker (2 wheeled) Gait Pattern/deviations: Step-through pattern;Decreased stride length;Trunk  flexed Gait velocity: Very slow Gait velocity interpretation: Below normal speed for age/gender General Gait Details: Pt was cued for improved posture and walker placement. Encouraged increased ambulation distance upon first bout of ambulation - pt was able to achieve 200 feet. On second bout of ambulation pt was able to achieve 240 feet. No reports of fatigue and O2 sats at 94%.    Stairs            Wheelchair Mobility    Modified Rankin (Stroke Patients Only)       Balance Overall balance assessment: Needs assistance Sitting-balance support: Feet supported;No upper extremity supported Sitting balance-Leahy Scale: Fair     Standing balance support: Bilateral upper extremity supported;During functional activity Standing balance-Leahy Scale: Poor Standing balance comment: Pt requires UE support for dynamic standing activity.                     Cognition Arousal/Alertness: Awake/alert Behavior During Therapy: Flat affect Overall Cognitive Status: Within Functional Limits for tasks assessed                      Exercises      General Comments        Pertinent Vitals/Pain Pain Assessment: No/denies pain    Home Living                      Prior Function            PT Goals (current goals can now be found in the care plan section) Acute Rehab PT  Goals Patient Stated Goal: None stated PT Goal Formulation: With patient Time For Goal Achievement: 03/15/14 Potential to Achieve Goals: Good Progress towards PT goals: Progressing toward goals    Frequency  Min 3X/week    PT Plan Discharge plan needs to be updated    Co-evaluation             End of Session Equipment Utilized During Treatment: Gait belt Activity Tolerance: Patient limited by fatigue Patient left: with call bell/phone within reach (Sitting EOB)     Time: 1610-9604 PT Time Calculation (min) (ACUTE ONLY): 20 min  Charges:  $Gait Training: 8-22 mins                     G Codes:      Conni Slipper 15-Mar-2014, 1:45 PM   Conni Slipper, PT, DPT Acute Rehabilitation Services Pager: [phone]

## 2014-03-15 LAB — RENAL FUNCTION PANEL
ALBUMIN: 2.5 g/dL — AB (ref 3.5–5.2)
Anion gap: 13 (ref 5–15)
BUN: 71 mg/dL — ABNORMAL HIGH (ref 6–23)
CO2: 24 mmol/L (ref 19–32)
CREATININE: 5.91 mg/dL — AB (ref 0.50–1.10)
Calcium: 8.5 mg/dL (ref 8.4–10.5)
Chloride: 102 mmol/L (ref 96–112)
GFR calc Af Amer: 8 mL/min — ABNORMAL LOW (ref >90)
GFR calc non Af Amer: 7 mL/min — ABNORMAL LOW (ref >90)
Glucose, Bld: 183 mg/dL — ABNORMAL HIGH (ref 70–99)
Phosphorus: 7.7 mg/dL — ABNORMAL HIGH (ref 2.3–4.6)
Potassium: 3.1 mmol/L — ABNORMAL LOW (ref 3.5–5.1)
Sodium: 139 mmol/L (ref 135–145)

## 2014-03-15 LAB — PROTEIN ELECTROPHORESIS, SERUM
Albumin ELP: 43.9 % — ABNORMAL LOW (ref 55.8–66.1)
Alpha-1-Globulin: 9.2 % — ABNORMAL HIGH (ref 2.9–4.9)
Alpha-2-Globulin: 18.4 % — ABNORMAL HIGH (ref 7.1–11.8)
Beta 2: 8 % — ABNORMAL HIGH (ref 3.2–6.5)
Beta Globulin: 6.3 % (ref 4.7–7.2)
Gamma Globulin: 14.2 % (ref 11.1–18.8)
M-Spike, %: 0.22 g/dL
Total Protein ELP: 5.7 g/dL — ABNORMAL LOW (ref 6.0–8.3)

## 2014-03-15 LAB — GLUCOSE, CAPILLARY
Glucose-Capillary: 80 mg/dL (ref 70–99)
Glucose-Capillary: 163 mg/dL — ABNORMAL HIGH (ref 70–99)
Glucose-Capillary: 129 mg/dL — ABNORMAL HIGH (ref 70–99)

## 2014-03-15 MED ORDER — INSULIN ASPART PROT & ASPART (70-30 MIX) 100 UNIT/ML ~~LOC~~ SUSP: 15.0000 [IU] | Freq: Two times a day (BID) | SUBCUTANEOUS | Status: AC

## 2014-03-15 MED ORDER — POTASSIUM CHLORIDE ER 20 MEQ PO TBCR: 40.0000 meq | EXTENDED_RELEASE_TABLET | Freq: Every day | ORAL | Status: AC

## 2014-03-15 MED ORDER — CALCITRIOL 0.25 MCG PO CAPS: 0.2500 ug | ORAL_CAPSULE | Freq: Every day | ORAL | Status: AC

## 2014-03-15 MED ORDER — GLUCERNA SHAKE PO LIQD: 237.0000 mL | Freq: Three times a day (TID) | ORAL | Status: AC | PRN

## 2014-03-15 MED ORDER — BUPROPION HCL ER (SR) 150 MG PO TB12: 150.0000 mg | ORAL_TABLET | Freq: Every day | ORAL | Status: AC

## 2014-03-15 MED ORDER — CARVEDILOL 12.5 MG PO TABS: 12.5000 mg | ORAL_TABLET | Freq: Two times a day (BID) | ORAL | Status: AC

## 2014-03-15 MED ORDER — AMLODIPINE BESYLATE 10 MG PO TABS: 10.0000 mg | ORAL_TABLET | Freq: Every day | ORAL | Status: AC

## 2014-03-15 MED ORDER — FUROSEMIDE 80 MG PO TABS: 160.0000 mg | ORAL_TABLET | Freq: Two times a day (BID) | ORAL | Status: AC

## 2014-03-15 MED ORDER — POTASSIUM CHLORIDE CRYS ER 20 MEQ PO TBCR
40.0000 meq | EXTENDED_RELEASE_TABLET | Freq: Once | ORAL | Status: AC
  Administered 2014-03-15: 40 meq via ORAL
  Filled 2014-03-15: qty 2

## 2014-03-15 NOTE — Progress Notes (Signed)
Patient Discharge:  Disposition: Pt discharged home with sister  Education: Pt and sister educated on medications and administration, follow up appointments, diet , and all discharge instructions. Handouts given to pt. Pt and sister verbalized understanding.  IV: PICC line removed by IV team  Telemetry: N/A  Follow-up appointments: Reviewed with pt and her sister.   Prescriptions: Scripts given to pt.  Transportation: Pt transported home by sister  Belongings: All belongings taken with pt.

## 2014-03-15 NOTE — Care Management Note (Signed)
CARE MANAGEMENT NOTE 03/15/2014  Patient:  Jackie Fisher, Jackie Fisher   Account Number:  [id number]  Date Initiated:  02/25/2014  Documentation initiated by:  CHILDRESS,JESSICA  Subjective/Objective Assessment:   Pt found unresponsive at home. Information obtained from brother. Pt lives alone independently. Pt has not been to [facility] in years but has been to [facility] in the past.     Action/Plan:   Pt is intubated and unconsious at this time. Will continue to follow for CM needs.  03/05/2014 Pt alert and able to work with PT and OT, met with pt and discussed [facility] she has transportation , appointment scheduled.   Anticipated DC Date:  03/15/2014   Anticipated DC Plan:  [place]  CM consult      Choice offered to / List presented to:             Status of service:  Completed, signed off Medicare Important Message given?  NO (If response is "NO", the following Medicare IM given date fields will be blank) Date Medicare IM given:   Medicare IM given by:   Date Additional Medicare IM given:   Additional Medicare IM given by:    Discharge Disposition:  HOME/SELF CARE  Per UR Regulation:    If discussed at Long Length of Stay Meetings, dates discussed:   03/05/2014    Comments:  03/15/2014 Pt will d/c to home today, followup appointment with PCP scheduled for Wed, Mar 20, 2014 @ 10am. Pt does not qualify for [facility] based on her available line of credit. Followup with nephrologist in [place] being arranged by renal consult. CRoyal RN MPH, case manager, [phone]  03/12/2014 Per PT this pt is progressing , and family however stating that they would like SNF as the pt needs help eating. Unclear why the pt would require assistance with meals, however pt has no payor source for SNF and has no need for skilled PT.  CRoyal RN MPH, case manager, [phone]   03/08/2014 Per [facility] this pt does not qualify for [facility] therefore would be  required to pay for [facility] services this was discussed with family and the family has committed to provided 24/7 support for this pt until she regains her strength. CRoyal RN MPH, case manager, [phone]  02/25/2014 Charles Mix, RN, MSN, [facility] 03/07/2014 Noted orders for [facility] however pt is uninsured and we may be able to get [facility] only. If pt becomes HD dependent we can have her family apply for Medicaid and the pt could get [facility] and Social worker still no HHPT or [place]. CRoyal RN MPH, case manager, [phone]  03/05/2014 Met with pt who varified that she is uninsured, she does have transportation and is interested in the [employer] in [place]. Appointment scheduled and info placed in pt chart, appointment is for Wed, Mar 13, 2014 at 10am, pt will be given a list of items to bring in order to be eval for adm to the clinic.  This infor was placed in the front of the pt chart to be given to the pt at the time of d/c. Will follow for possible [facility] services for disease management at d/c. CIR following , however pt appears to be improving and may not qualify for CIR. CRoyal RN MPH, case manager, [phone]   03/01/14  12noon.  Luz Lex, RNBSN [id number] Patient states has no insurance - cannot afford diabetic meds.   patient  from [place].  Tried to talk to patient. Briefly opened eyes - only answering in grunts.  Did nod negative to not having a PCP.  Once more alert will need to try to set her up with resources closer to home.  Can get into the [employer] here in [place] but would probably not want to do that due to distance.  CM will continue to follow.  02-28-14 3:15pm Luz Lex, RNBSN [phone] Tx to cone on 1-20 due to continued acidosis and vent. Extubated today.

## 2014-03-15 NOTE — Progress Notes (Signed)
ED CM [facility] [place] [facility]. Reviewed information and re-enrolled patient. MATCH approved as per Pharmacist.

## 2014-03-15 NOTE — Progress Notes (Signed)
S:  No new CO. O:BP 150/56 mmHg  Pulse 77  Temp(Src) 98.2 F (36.8 C) (Oral)  Resp 17  Ht 5\' 7"  (1.702 m)  Wt 85.004 kg (187 lb 6.4 oz)  BMI 29.34 kg/m2  SpO2 100%  LMP   Intake/Output Summary (Last 24 hours) at 03/15/14 0916 Last data filed at 03/15/14 0802  Gross per 24 hour  Intake    440 ml  Output   2175 ml  Net  -1735 ml   Weight change: -2.396 kg (-5 lb 4.5 oz) ZOX:WRUEAGen:awake and alert CVS:RRR Resp: few basilar crackles Abd:+ BS NTND Ext: 1-2+ edema NEURO:CNI Ox3 no asterixis   . amLODipine  10 mg Oral Daily  . antiseptic oral rinse  7 mL Mouth Rinse BID  . buPROPion  150 mg Oral Daily  . calcitRIOL  0.25 mcg Oral Daily  . carvedilol  12.5 mg Oral BID WC  . chlorhexidine  15 mL Mouth Rinse BID  . furosemide  160 mg Oral BID  . insulin aspart  0-9 Units Subcutaneous TID WC  . insulin aspart protamine- aspart  15 Units Subcutaneous BID WC   Dg Chest 2 View  03/14/2014   CLINICAL DATA:  Patient was found unresponsive, hypoglycemia, mental status change, acute renal failure, recent pneumonia  EXAM: CHEST  2 VIEW  COMPARISON:  Portable chest x-ray of February 28, 2014.  FINDINGS: The lungs are adequately inflated. There are persistent coarse lung markings in the right hilar and infrahilar regions. There is persistent increased density in the left lower lobe partially obscuring the costophrenic angle. The cardiac silhouette is mildly enlarged. The central pulmonary vascularity is prominent. There is a small left pleural effusion. The right-sided PICC line tip projects over the right atrium.  IMPRESSION: 1. There has been some improvement in the appearance of the pulmonary interstitium but right middle and left lower lobe pneumonia persists. Superimposed low-grade CHF is present. There is a small left pleural effusion. 2. The right-sided PICC line tip projects over the right atrium. Withdrawal by 5cm is recommended.   Electronically Signed   By: David  [place]azil[place]   On: 03/14/2014 12:35    BMET    Component Value Date/Time   NA 139 03/15/2014 0540   K 3.1* 03/15/2014 0540   CL 102 03/15/2014 0540   CO2 24 03/15/2014 0540   GLUCOSE 183* 03/15/2014 0540   BUN 71* 03/15/2014 0540   CREATININE 5.91* 03/15/2014 0540   CALCIUM 8.5 03/15/2014 0540   GFRNONAA 7* 03/15/2014 0540   GFRAA 8* 03/15/2014 0540   CBC    Component Value Date/Time   WBC 5.6 03/13/2014 0921   RBC 2.74* 03/13/2014 0921   RBC 2.90* 02/27/2014 2100   HGB 8.3* 03/13/2014 0921   HCT 24.8* 03/13/2014 0921   PLT 211 03/13/2014 0921   MCV 90.5 03/13/2014 0921   MCH 30.3 03/13/2014 0921   MCHC 33.5 03/13/2014 0921   RDW 13.8 03/13/2014 0921   LYMPHSABS 0.6* 03/13/2014 0921   MONOABS 0.3 03/13/2014 0921   EOSABS 0.2 03/13/2014 0921   BASOSABS 0.1 03/13/2014 0921     Assessment: 1.  Acute on presumably CKD, Scr sl lower 2.  Sec HPTH on calcitriol 3. Anemia SP IV iron 4. DM 5. HTN 6. hypokalemia Plan: 1. Replace K 2. I am not opposed to her going home as long as her sisters can help with her care.  She would need FU appt with Dr Fausto SkillernBefakadu next to follow renal fx.  Barett Whidbee T

## 2014-03-15 NOTE — Progress Notes (Signed)
Patient tolerated PICC line removal well. Pressure dressing applied and no bleeding noted at site,instructed patient that pressure dressing to remain clean,dry and intact for 24 hrs. Cautioned patient to remain supine for 30 mins. after PICC removed to avoid any complications. Eliot FordSarah Dago Jungwirth RN [facility]

## 2014-03-15 NOTE — Consult Note (Signed)
Psychiatry Consult Follow up  Reason for Consult:  Depression and multiple medical problems Referring Physician:  Dr. Isidoro Donning  Patient Identification: Jackie Fisher MRN:  [id number] Principal Diagnosis: Depression Diagnosis:   Patient Active Problem List   Diagnosis Date Noted  . Depression [F32.9] 03/14/2014  . CKD (chronic kidney disease) stage 4, GFR 15-29 ml/min [N18.4]   . UTI (urinary tract infection) [N39.0] 03/08/2014  . Leukocytosis [D72.829] 03/07/2014  . Hypokalemia [E87.6]   . Altered mental state [R41.82]   . Community acquired pneumonia [J18.9]   . Acute respiratory failure [J96.00]   . Acute encephalopathy [G93.40] 02/24/2014  . DKA (diabetic ketoacidoses) [E13.10] 02/24/2014  . Acute respiratory failure with hypoxia [J96.01] 02/24/2014  . Acute renal failure [N17.9] 02/24/2014  . Sepsis with acute organ dysfunction [A41.9, R65.20] 02/24/2014  . Aspiration pneumonia [J69.0] 02/24/2014  . Metabolic acidosis [E87.2] 02/24/2014  . Sepsis [A41.9] 02/24/2014  . Diabetic ketoacidosis with coma associated with diabetes mellitus due to underlying condition [E08.11]     Total Time spent with patient: 20 minutes  Subjective:   Jackie Fisher is a 59 y.o. female patient admitted with AMS and depression.  HPI:  Jackie Fisher is a 59 years old female seen, chart reviewed for psychiatric consultation and medication management of depression and increase his psychosocial stressors. Patient reported she has been struggling with lack of support, financial difficulties which made her depressed, sad, tearful, isolation, less socialization which resulted not able to care for herself resulted developing diabetic ketoacidosis and altered mental status at the time of admission. Patient lives by herself in a house and not able to make money even though able to work with the [employer] and has no insurance which resulted not able to care for her diabetes. Patient has 3 sisters in out of town  reportedly supportive to her but able to receive support from them. Patient stated that "I can do like I'm able to do before". Patient denies current suicidal, homicidal ideation, intention or plans. Patient denied family history of mental illness, substance abuse and suicidal attempts. Patient contract for safety while in the hospital and willing to receive outpatient psychiatric services including medication management. Patient consented for antidepressant medication Wellbutrin SR at this time. Patient's sister from [place] visiting her who seems to be supportive to her.  Interval history: Patient seen for psychiatric consultation follow-up today. Patient appeared sitting in a chair next to her bed, calm and cooperative. Patient reported she was able to take her medication for depression both yesterday and today and tolerated without side effects. Patient reported she started feeling somewhat better and slept well last night. Patient feels that she can go to the outpatient psychiatric services at this time and may benefit from higher dose of her medication a week from now. Patient has no safety issues and her siblings are somewhat supportive to her.  Medical history: Patient presented with altered mental status, tachypneic, tachycardic, severe sepsis, b/l PNA, DKA with severe metabolic acidosis. Was intubated in ED. Thought to have developed DKA >> encephalopathy >> asp pna >> sepsis. Was placed on insulin gtt, abx, and pulm and nephro consulted. hgba1c 13.2. AKI failed to improve with IV fluids. CT head done for ams., neg. Had high anion gap acidosis which resolved. Now has NAGAcidosis. Also had anemia and thrombocytopenia. Per discussion with family today, she was doing well before Saturday. On Saturday 1/16 she was having some cough and cold symptoms, her brother was sick who lives  next to her. Then on Sunday she was walking back from her bathroom and was found down on the floor in her house by her  brother who was incidentally checking on her. Has hx of diabetes per sister but was not taking any medication per sisters. No other medical problems.    Past Medical History:  Past Medical History  Diagnosis Date  . Hypertension   . Diabetes mellitus without complication     Past Surgical History  Procedure Laterality Date  . Abdominal hysterectomy     Family History:  Family History  Problem Relation Age of Onset  . Family history unknown: Yes   Social History:  History  Alcohol Use No     History  Drug Use No    History   Social History  . Marital Status: Single    Spouse Name: N/A    Number of Children: N/A  . Years of Education: N/A   Social History Main Topics  . Smoking status: Never Smoker   . Smokeless tobacco: None  . Alcohol Use: No  . Drug Use: No  . Sexual Activity: No   Other Topics Concern  . None   Social History Narrative   Additional Social History:         Allergies:  No Known Allergies  Vitals: Blood pressure 150/56, pulse 77, temperature 98.2 F (36.8 C), temperature source Oral, resp. rate 17, height 5\' 7"  (1.702 m), weight 85.004 kg (187 lb 6.4 oz), SpO2 100 %.  Risk to Self: Is patient at risk for suicide?: No Risk to Others:   Prior Inpatient Therapy:   Prior Outpatient Therapy:    Current Facility-Administered Medications  Medication Dose Route Frequency Provider Last Rate Last Dose  . acetaminophen (TYLENOL) solution 650 mg  650 mg Oral Q4H PRN Lonia BloodJeffrey T McClung, MD      . albuterol (PROVENTIL) (2.5 MG/3ML) 0.083% nebulizer solution 2.5 mg  2.5 mg Nebulization Q2H PRN Erick BlinksJehanzeb Memon, MD      . amLODipine (NORVASC) tablet 10 mg  10 mg Oral Daily Lonia BloodJeffrey T McClung, MD   10 mg at 03/14/14 1025  . antiseptic oral rinse (CPC / CETYLPYRIDINIUM CHLORIDE 0.05%) solution 7 mL  7 mL Mouth Rinse BID Richarda OverlieNayana Abrol, MD   7 mL at 03/15/14 0800  . buPROPion [facility](WELLBUTRIN SR) 12 hr tablet 150 mg  150 mg Oral Daily Nehemiah SettleJanardhaha R Ambika Zettlemoyer, MD    150 mg at 03/14/14 1300  . calcitRIOL (ROCALTROL) capsule 0.25 mcg  0.25 mcg Oral Daily Trevor IhaJames L Deterding, MD   0.25 mcg at 03/14/14 1025  . carvedilol (COREG) tablet 12.5 mg  12.5 mg Oral BID WC Dyke MaesMichael T Mattingly, MD   12.5 mg at 03/15/14 0805  . chlorhexidine (PERIDEX) 0.12 % solution 15 mL  15 mL Mouth Rinse BID Erick BlinksJehanzeb Memon, MD   15 mL at 03/15/14 0804  . feeding supplement (GLUCERNA SHAKE) (GLUCERNA SHAKE) liquid 237 mL  237 mL Oral TID BM PRN Lonia BloodJeffrey T McClung, MD   237 mL at 03/10/14 1802  . furosemide (LASIX) tablet 160 mg  160 mg Oral BID Dyke MaesMichael T Mattingly, MD   160 mg at 03/15/14 0805  . insulin aspart (novoLOG) injection 0-9 Units  0-9 Units Subcutaneous TID WC Richarda OverlieNayana Abrol, MD   2 Units at 03/15/14 0804  . insulin aspart protamine- aspart (NOVOLOG MIX 70/30) injection 15 Units  15 Units Subcutaneous BID WC Rodolph Bonganiel Thompson V, MD   15 Units at 03/15/14 [phone]  .  loperamide (IMODIUM) capsule 2 mg  2 mg Oral PRN Lonia Blood, MD   2 mg at 03/03/14 1230  . menthol-cetylpyridinium (CEPACOL) lozenge 3 mg  1 lozenge Oral PRN Roma Kayser Schorr, NP   3 mg at 03/04/14 0456  . ondansetron (ZOFRAN) injection 4 mg  4 mg Intravenous Q6H PRN Erick Blinks, MD      . phenol (CHLORASEPTIC) mouth spray 1 spray  1 spray Mouth/Throat PRN Leanne Chang, NP   1 spray at 03/04/14 0455  . sodium chloride 0.9 % injection 10-40 mL  10-40 mL Intracatheter PRN Erick Blinks, MD   10 mL at 03/13/14 0515    Musculoskeletal: Strength & Muscle Tone: decreased Gait & Station: unable to stand Patient leans: N/A  Psychiatric Specialty Exam: Physical Exam as per history and physical   ROS depression, anxiety and multiple psychosocial stresses   Blood pressure 150/56, pulse 77, temperature 98.2 F (36.8 C), temperature source Oral, resp. rate 17, height  (1.702 m), weight 85.004 kg (187 lb 6.4 oz), SpO2 100 %.Body mass index is 29.34 kg/(m^2).  General Appearance: Disheveled and Guarded  Eye  Contact::  Good  Speech:  Clear and Coherent and Slow  Volume:  Decreased  Mood:  Dysphoric  Affect:  Depressed and Tearful  Thought Process:  Coherent and Goal Directed  Orientation:  Full (Time, Place, and Person)  Thought Content:  Rumination  Suicidal Thoughts:  No  Homicidal Thoughts:  No  Memory:  Immediate;   Fair Recent;   Fair  Judgement:  Impaired  Insight:  Lacking  Psychomotor Activity:  Decreased  Concentration:  Fair  Recall:  Good  Fund of Knowledge:Fair  Language: Good  Akathisia:  NA  Handed:  Right  AIMS (if indicated):     Assets:  Communication Skills Desire for Improvement Housing Leisure Time Resilience Social Support Talents/Skills  ADL's:  Impaired  Cognition: Impaired,  Mild   Sleep:      Medical Decision Making: Review of Psycho-Social Stressors (1), Established Problem, Worsening (2), New Problem, with no additional work-up planned (3), Review of Medication Regimen & Side Effects (2) and Review of New Medication or Change in Dosage (2)  Treatment Plan Summary: Daily contact with patient to assess and evaluate symptoms and progress in treatment and Medication management  Plan: Continue Wellbutrin SR 150 mg PO QD for depression No evidence of imminent risk to self or others at present.   Patient does not meet criteria for psychiatric inpatient admission. Supportive therapy provided about ongoing stressors.  Psychiatric consultation will sign off now and referred to out patient psychiatric services Please contact [phone] or [phone] if needs further assistance  Disposition: Patient will be referred to the outpatient psychiatric services and medically stable  Quanisha Drewry,JANARDHAHA R. 03/15/2014 8:16 AM

## 2014-03-15 NOTE — Care Management Note (Signed)
CARE MANAGEMENT NOTE  03/15/2014   Patient:  Jackie Fisher, Jackie Fisher   Account Number:  [id number]  Date Initiated:  02/25/2014  Documentation initiated by:  CHILDRESS,JESSICA  Subjective/Objective Assessment:   Jackie Fisher found unresponsive at home. Information obtained from brother. Jackie Fisher lives alone independently. Jackie Fisher has not been to [facility] in years but has been to [facility] in the past.     Action/Plan:   Jackie Fisher is intubated and unconsious at this time. Will continue to follow for CM needs.  03/05/2014 Jackie Fisher alert and able to work with Jackie Fisher and OT, met with Jackie Fisher and discussed [facility] Jackie Fisher has transportation , appointment scheduled.   Anticipated DC Date:  03/15/2014   Anticipated DC Plan:  [place]  CM consult  [place] [facility]      Choice offered to / List presented to:             Status of service:  Completed, signed off Medicare Important Message given?  NO (If response is "NO", the following Medicare IM given date fields will be blank) Date Medicare IM given:   Medicare IM given by:   Date Additional Medicare IM given:   Additional Medicare IM given by:    Discharge Disposition:  HOME/SELF CARE  Per UR Regulation:    If discussed at Long Length of Stay Meetings, dates discussed:   03/05/2014    Comments:  03/15/2014 Jackie Fisher will d/c to home today, followup appointment with PCP scheduled for Wed, Mar 20, 2014 @ 10am. Jackie Fisher does not qualify for [facility] based on her available line of credit. Followup with nephrologist in [place] being arranged by renal consult. CRoyal RN MPH, case manager, [phone] [place] letter given to Jackie Fisher sister to use for Jackie Fisher prescriptions.  CRoyal RN MPH, case manager, [phone]  03/12/2014 Per Jackie Fisher this Jackie Fisher is progressing , and family however stating that they would like SNF as the Jackie Fisher needs help eating. Unclear why the Jackie Fisher would require assistance with meals, however Jackie Fisher has no payor source for SNF and has no need for skilled Jackie Fisher.  CRoyal  RN MPH, case manager, [phone]   03/08/2014 Per [facility] this Jackie Fisher does not qualify for [facility] therefore would be required to pay for [facility] services this was discussed with family and the family has committed to provided 24/7 support for this Jackie Fisher until Jackie Fisher regains her strength. CRoyal RN MPH, case manager, [phone]  02/25/2014 [place], RN, MSN, [facility] 03/07/2014 Noted orders for [facility] however Jackie Fisher is uninsured and we may be able to get [facility] only. If Jackie Fisher becomes HD dependent we can have her family apply for Medicaid and the Jackie Fisher could get [facility] and Social worker still no HHPT or [facility]. CRoyal RN MPH, case manager, [phone] Spoke with Jackie Fisher and sister re DME, walker and 3:1, this family was advised to purchase this DME from a consignment or [place] store, they however have not explored this option. Now that Jackie Fisher is ready for d/c and Jackie Fisher does not qualify for charity care the walker would cost $59 and the 3:1 would cost $39. This is cost prohabitive for this Jackie Fisher . Family encouraged again to explore [employer] store option. Jackie Fisher sister states that Jackie Fisher can do this. Jackie Fisher given [place] letter to purchase her medications.  CRoyal RN MPH, case manager, [phone]  03/05/2014 Met with Jackie Fisher who varified that Jackie Fisher is uninsured, Jackie Fisher does have transportation and is interested in the [employer]  in [place]. Appointment scheduled and info placed in Jackie Fisher chart, appointment is for Wed, Mar 13, 2014 at 10am, Jackie Fisher will be given a list of items to bring in order to be eval for adm to the clinic.  This infor was placed in the front of the Jackie Fisher chart to be given to the Jackie Fisher at the time of d/c. Will follow for possible [facility] services for disease management at d/c. CIR following , however Jackie Fisher appears to be improving and may not qualify for CIR. CRoyal RN MPH, case manager, [phone]   03/01/14  12noon.  Luz Lex, RNBSN [phone] Patient states has no insurance - cannot afford diabetic meds.   patient from [facility].  Tried to talk to patient.  Briefly opened eyes - only answering in grunts.  Did nod negative to not having a PCP.  Once more alert will need to try to set her up with resources closer to home.  Can get into the [facility] here in [place] but would probably not want to do that due to distance.  CM will continue to follow.  02-28-14 3:15pm Luz Lex, RNBSN [phone] Tx to cone on 1-20 due to continued acidosis and vent. Extubated today.

## 2014-03-15 NOTE — Discharge Summary (Signed)
Physician Discharge Summary  Jackie Fisher MRN: [id number] DOB/AGE: 59-Mar-1957 59 y.o.  PCP: No primary care provider on file.   Admit date: 02/24/2014 Discharge date: 03/15/2014  Discharge Diagnoses:   Principal Problem:   Depression Active Problems:   Acute encephalopathy   DKA (diabetic ketoacidoses)   Acute respiratory failure with hypoxia   Acute renal failure   Sepsis with acute organ dysfunction   Aspiration pneumonia   Metabolic acidosis   Sepsis   Altered mental state   Community acquired pneumonia   Acute respiratory failure   Hypokalemia   Leukocytosis   UTI (urinary tract infection)   CKD (chronic kidney disease) stage 4, GFR 15-29 ml/min  Follow-up recommendations Follow-up with all appointments that have been set up, PCP, nephrology Follow-up CBC, BMP in 3-5 days    Medication List    TAKE these medications        amLODipine 10 MG tablet  Commonly known as:  NORVASC  Take 1 tablet (10 mg total) by mouth daily.     buPROPion 150 MG 12 hr tablet  Commonly known as:  WELLBUTRIN SR  Take 1 tablet (150 mg total) by mouth daily.     calcitRIOL 0.25 MCG capsule  Commonly known as:  ROCALTROL  Take 1 capsule (0.25 mcg total) by mouth daily.     carvedilol 12.5 MG tablet  Commonly known as:  COREG  Take 1 tablet (12.5 mg total) by mouth 2 (two) times daily with a meal.     feeding supplement (GLUCERNA SHAKE) Liqd  Take 237 mLs by mouth 3 (three) times daily between meals as needed (hunger).     furosemide 80 MG tablet  Commonly known as:  LASIX  Take 2 tablets (160 mg total) by mouth 2 (two) times daily.     insulin aspart protamine- aspart (70-30) 100 UNIT/ML injection  Commonly known as:  NOVOLOG MIX 70/30  Inject 0.15 mLs (15 Units total) into the skin 2 (two) times daily with a meal.        Discharge Condition: Stable  Disposition: Home with home physical therapy   Consults:  Psychiatry Nephrology Critical care   Significant  Diagnostic Studies: Dg Chest 2 View  03/14/2014   CLINICAL DATA:  Patient was found unresponsive, hypoglycemia, mental status change, acute renal failure, recent pneumonia  EXAM: CHEST  2 VIEW  COMPARISON:  Portable chest x-ray of February 28, 2014.  FINDINGS: The lungs are adequately inflated. There are persistent coarse lung markings in the right hilar and infrahilar regions. There is persistent increased density in the left lower lobe partially obscuring the costophrenic angle. The cardiac silhouette is mildly enlarged. The central pulmonary vascularity is prominent. There is a small left pleural effusion. The right-sided PICC line tip projects over the right atrium.  IMPRESSION: 1. There has been some improvement in the appearance of the pulmonary interstitium but right middle and left lower lobe pneumonia persists. Superimposed low-grade CHF is present. There is a small left pleural effusion. 2. The right-sided PICC line tip projects over the right atrium. Withdrawal by 5cm is recommended.   Electronically Signed   By: David  Martinique   On: 03/14/2014 12:35   Ct Head Wo Contrast  02/24/2014   CLINICAL DATA:  Found on bedroom floor unresponsive; ams  EXAM: CT HEAD WITHOUT CONTRAST  TECHNIQUE: Contiguous axial images were obtained from the base of the skull through the vertex without intravenous contrast.  COMPARISON:  None.  FINDINGS: Diffusely  enlarged ventricles and subarachnoid spaces. Patchy white matter low density in both cerebral hemispheres. No skull fracture, intracranial hemorrhage, mass lesion, CT evidence of acute infarction or paranasal sinus air-fluid levels.  IMPRESSION: No acute abnormality. Mild diffuse cerebral and cerebellar atrophy and minimal chronic small vessel white matter ischemic changes in both cerebral hemispheres.   Electronically Signed   By: Enrique Sack M.D.   On: 02/24/2014 11:28   Ct Cervical Spine Wo Contrast  02/24/2014   CLINICAL DATA:  Patient found down and unresponsive.   EXAM: CT CERVICAL SPINE WITHOUT CONTRAST  TECHNIQUE: Multidetector CT imaging of the cervical spine was performed without intravenous contrast. Multiplanar CT image reconstructions were also generated.  COMPARISON:  None.  FINDINGS: There is mild reversal of the normal cervical lordosis. No fracture or malalignment is identified. 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 Lung apices are clear. Gas in soft tissue structures in the upper chest is likely related to vascular access.  IMPRESSION: No acute abnormality.  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   Electronically Signed   By: Inge Rise M.D.   On: 02/24/2014 12:56   US Renal  02/25/2014   CLINICAL DATA:  Acute renal failure, increased BUN and creatinine  EXAM: RENAL/URINARY TRACT ULTRASOUND COMPLETE  COMPARISON:  None.  FINDINGS: Right Kidney:  Length: 12.0 cm. Echogenicity within normal limits. No mass or hydronephrosis visualized. Small amount of perinephric fluid noted lower pole.  Left Kidney:  Length: 13.2 cm. Echogenicity within normal limits. No mass or hydronephrosis visualized.  Bladder:  There is a Foley catheter within urinary bladder. Some debris noted within dependent gallbladder.  IMPRESSION: No hydronephrosis. No diagnostic renal calculus. Small perinephric fluid lower pole of the right kidney. Foley catheter noted within a decompressed urinary bladder. Probable small amount of debris within bladder.   Electronically Signed   By: Lahoma Crocker M.D.   On: 02/25/2014 13:24   Dg Chest Port 1 View  02/28/2014   CLINICAL DATA:  Respiratory failure  EXAM: PORTABLE CHEST - 1 VIEW  COMPARISON:  02/26/2014  FINDINGS: There is an endotracheal tube which terminates between the clavicular heads and carina. A gastric suction tube continues below  the diaphragm. Right upper extremity PICC, tip at the upper right atrium.  Given differences in technique, bilateral lung opacities with dense air bronchograms in the retrocardiac lung are unchanged. There is a right pleural effusion. No pulmonary edema or pneumothorax.  IMPRESSION: 1. Unchanged positioning of tubes and PICC. 2. Extensive bilateral pneumonia. No significant change since yesterday.   Electronically Signed   By: Jorje Guild M.D.   On: 02/28/2014 05:40   Dg Chest Port 1 View  02/26/2014   CLINICAL DATA:  Ventilation.  EXAM: PORTABLE CHEST - 1 VIEW  COMPARISON:  None.  FINDINGS: Tracheostomy tube, NG tube, right PICC line in stable position. Cardiomegaly with normal pulmonary vascularity. Bilateral patchy pulmonary infiltrates. Small left pleural effusion cannot be excluded. No pneumothorax. No acute osseous abnormality.  IMPRESSION: 1. Lines and tubes in stable position. 2. Persistent bilateral dense pulmonary infiltrates most likely secondary pneumonia. No interim change. Small left pleural effusion. 3. Stable cardiomegaly.  Normal pulmonary vascularity.   Electronically Signed   By: Marcello Moores  Register   On: 02/26/2014 07:26   Dg Chest Port 1 [address]  02/25/2014   CLINICAL DATA:  Subsequent encounter for ventilator dependence and endotracheal tube placement.  EXAM: PORTABLE CHEST - 1 VIEW  COMPARISON:  Multiple recent previous exams.  FINDINGS: 0543 hrs. Endotracheal tube tip is 1.3 cm above the base of the carina. The NG tube passes into the stomach although the distal tip position is not included on the film. Right PICC line tip overlies the distal SVC near the RA junction. Patchy bilateral airspace disease is stable. Cardiopericardial silhouette is at upper limits of normal for size. Telemetry leads overlie the chest.  IMPRESSION: Stable exam. No interval change in the patchy bilateral airspace disease suggesting multi focal pneumonia.   Electronically Signed   By: Misty Stanley M.D.   On:  02/25/2014 07:14   Dg Chest Port 1 View  02/24/2014   CLINICAL DATA:  Found on bedroom floor unresponsive; ams  EXAM: PORTABLE CHEST - 1 VIEW  COMPARISON:  None.  FINDINGS: Enlarged cardiac silhouette. Extensive patchy opacity in both lungs. Mildly prominent interstitial markings. No pleural fluid seen. Unremarkable bones.  IMPRESSION: 1. Extensive bilateral airspace opacity, most likely due to pneumonia or aspiration pneumonitis. This does not have the typical appearance of pulmonary edema. Follow-up to resolution is recommended to exclude any underlying masses. 2. Cardiomegaly and probable chronic interstitial lung disease.   Electronically Signed   By: Enrique Sack M.D.   On: 02/24/2014 11:27   Dg Chest Port 1v Same Day  02/24/2014   CLINICAL DATA:  Right PICC line placement  EXAM: PORTABLE CHEST - 1 VIEW SAME DAY  COMPARISON:  02/24/2014  FINDINGS: Right PICC line is in place. The tip is in the lower right atrium approximately 7-8 cm deep to the cavoatrial junction. Patchy bilateral areas of consolidation are again noted, unchanged. Endotracheal tube remains just above the carina, approximately 12 mm above the carina. NG tube is in the stomach.  IMPRESSION: Right PICC line tip in the lower right atrium approximately 7-8 cm deep to the cavoatrial junction.  Endotracheal tube approximately 12 mm above the carina.  Stable patchy bilateral airspace disease/consolidation.   Electronically Signed   By: Rolm Baptise M.D.   On: 02/24/2014 17:49   Dg Chest Port 1v Same Day  02/24/2014   CLINICAL DATA:  ET tube placement  EXAM: PORTABLE CHEST - 1 VIEW SAME DAY  COMPARISON:  02/24/2014  FINDINGS: Endotracheal tube is 1.6 cm above the carina. NG tube enters the stomach. Bilateral lower lobe airspace opacities as well as left upper lobe opacity again noted, unchanged. Heart is borderline in size.  IMPRESSION: Endotracheal tube 1.6 cm above the carina.  Bilateral airspace opacities are stable.   Electronically Signed    By: Rolm Baptise M.D.   On: 02/24/2014 14:05   Dg Abd Portable 1v  02/27/2014   CLINICAL DATA:  Initial evaluation for sepsis and pneumonia evaluate bowel gas pattern  EXAM: PORTABLE ABDOMEN - 1 VIEW  COMPARISON:  None.  FINDINGS: Orogastric tube projects over the left upper quadrant in the anticipated position in the stomach. Scattered air throughout small and large bowel. No abnormally dilated loops of bowel.  IMPRESSION: Nonobstructive gas pattern   Electronically Signed   By: Skipper Cliche M.D.   On: 02/27/2014 14:30      Microbiology: Recent Results (from the past 240 hour(s))  Culture, Urine     Status: None   Collection Time: 03/08/14 12:30 AM  Result Value Ref Range Status   Specimen Description URINE, RANDOM  Final   Special Requests NONE  Final   Colony Count   Final    50,000 COLONIES/ML Performed at [facility]    Culture   Final    Multiple bacterial morphotypes present, none predominant. Suggest appropriate recollection if clinically indicated. Performed at [facility]    Report Status 03/09/2014 FINAL  Final     Labs: Results for orders placed or performed during the hospital encounter of 02/24/14 (from the past 48 hour(s))  Glucose, capillary     Status: Abnormal   Collection Time: 03/13/14  3:58 PM  Result Value Ref Range   Glucose-Capillary 140 (H) 70 - 99 mg/dL  Glucose, capillary     Status: Abnormal   Collection Time: 03/13/14  9:03 PM  Result Value Ref Range   Glucose-Capillary 190 (H) 70 - 99 mg/dL  Glucose, capillary     Status: None   Collection Time: 03/14/14  3:21 AM  Result Value Ref Range   Glucose-Capillary 99 70 - 99 mg/dL  Renal function panel     Status: Abnormal   Collection Time: 03/14/14  5:00 AM  Result Value Ref Range   Sodium 139 135 - 145 mmol/L   Potassium 3.2 (L) 3.5 - 5.1 mmol/L   Chloride 102 96 - 112 mmol/L   CO2 24 19 - 32 mmol/L   Glucose, Bld 167 (H) 70 - 99 mg/dL   BUN 75 (H) 6 - 23 mg/dL    Creatinine, Ser 5.91 (H) 0.50 - 1.10 mg/dL   Calcium 8.4 8.4 - 10.5 mg/dL   Phosphorus 7.7 (H) 2.3 - 4.6 mg/dL   Albumin 2.3 (L) 3.5 - 5.2 g/dL   GFR calc non Af Amer 7 (L) >90 mL/min   GFR calc Af Amer 8 (L) >90 mL/min    Comment: (NOTE) The eGFR has been calculated using the CKD EPI equation. This calculation has not been validated in all clinical situations. eGFR's persistently <90 mL/min signify possible Chronic Kidney Disease.    Anion gap 13 5 - 15  Glucose, capillary     Status: Abnormal   Collection Time: 03/14/14  8:15 AM  Result Value Ref Range   Glucose-Capillary 162 (H) 70 - 99 mg/dL  Glucose, capillary     Status: Abnormal   Collection Time: 03/14/14 11:07 AM  Result Value Ref Range   Glucose-Capillary 187 (H) 70 - 99 mg/dL  Glucose, capillary     Status: Abnormal   Collection Time: 03/14/14  4:45 PM  Result Value Ref Range   Glucose-Capillary 204 (H) 70 - 99 mg/dL  Glucose, capillary     Status: Abnormal   Collection Time: 03/14/14  8:14 PM  Result Value Ref Range   Glucose-Capillary 133 (H) 70 - 99 mg/dL  Glucose, capillary     Status: None   Collection Time: 03/15/14  2:57 AM  Result Value Ref Range   Glucose-Capillary 80 70 - 99 mg/dL  Renal function panel     Status: Abnormal   Collection Time: 03/15/14  5:40 AM  Result Value Ref Range   Sodium 139 135 - 145 mmol/L   Potassium 3.1 (L) 3.5 - 5.1 mmol/L   Chloride 102 96 - 112 mmol/L   CO2 24 19 - 32 mmol/L   Glucose, Bld 183 (H) 70 - 99 mg/dL   BUN 71 (H) 6 - 23 mg/dL   Creatinine, Ser 5.91 (H) 0.50 - 1.10 mg/dL   Calcium 8.5 8.4 - 10.5 mg/dL   Phosphorus  7.7 (H) 2.3 - 4.6 mg/dL   Albumin 2.5 (L) 3.5 - 5.2 g/dL   GFR calc non Af Amer 7 (L) >90 mL/min   GFR calc Af Amer 8 (L) >90 mL/min    Comment: (NOTE) The eGFR has been calculated using the CKD EPI equation. This calculation has not been validated in all clinical situations. eGFR's persistently <90 mL/min signify possible Chronic  Kidney Disease.    Anion gap 13 5 - 15  Glucose, capillary     Status: Abnormal   Collection Time: 03/15/14  7:26 AM  Result Value Ref Range   Glucose-Capillary 163 (H) 70 - 99 mg/dL  Glucose, capillary     Status: Abnormal   Collection Time: 03/15/14 12:02 PM  Result Value Ref Range   Glucose-Capillary 129 (H) 70 - 99 mg/dL     HPI :*Jackie Fisher is a 59 y.o. female with no prior health history who was found unresponsive at home on 02/24/14 with hypothermia, acute renal failure, bilateral strep pneumo PNA and diabetic ketoacidosis. She was started on IVF as well as antibiotics for sepsis and required intubation in ED due to respiratory distress. She continued to have worsening of renal status with non gap acidosis as well as drop in Hgb to 8.9 and drop in platelets to 137 therefore was transferred to [facility] on 01/20 for further medical management. She tolerated extubation on 01/21 and nephrology consulted for input on renal status. Serologic workup negative and renal ultrasound unremarkable. Dr. Lorrene Reid felt that patient likely with underlying CKD with ATN due to sepsis. UOP improving on IVF/bicarb drip and mentation slowly improving. She has completed her antibiotic regimen and antibiotic associated diarrhea is resolving. Therapy evaluation done today and patient noted to be deconditioned. Patient now stable for discharge home with home health with close outpatient follow-up with PCP and nephrology Significant Events: 1/17 - admitted. DKA, severe sepsis. Started on vanc+zosyn. and insulin gtt, DKA protocol. Intubated for AMS 1/18 - 1/19 Gap closed but had hyperglycemia off insulin gtt so put back on it. Continued to have non gap acidosis. White count trending down. hgb dropped from 11.8 to 8-9 plt dropped from 323 --> 137. HIIT panel negative -1/20  1/21 Extubated  HOSPITAL COURSE:  #1 DKA./New onset diabetes mellitus Patient was admitted with DKA which is currently resolved. Anion gap is  closed. IV fluids have been discontinued. Hemoglobin A1c is 13.2. Continue insulin 70/30 15 units twice a day.  CBG stable  #2 bilateral pneumonia Repeat chest x-ray pneumonia, on 2/4 Status post 9 days of antibiotic coverage. Antibiotics have been discontinued. Will not restart, no signs of pneumonia clinically  #3 acute on chronic kidney disease stage III-unchanged  Creatinine slowly improving, 5.91 prior to discharge Nephrology to continue oral Lasix  Reviewed renal USG 1/18 No hydronephrosis. No diagnostic renal calculus  #4 thrombocytopenia HITT panel negative. Platelets have improved. Follow.  #5 hypokalemia Potassium low at 3.2 today Close outpatient follow-up BMP, Discharge home with potassium 40 MG daily  #6 diarrhea C. difficile negative. Imodium as needed.  #7 leukocytosis Questionable etiology. Patient is status post treatment for bilateral pneumonia. Patient with no respiratory symptoms. Urinalysis consistent with probable UTI. Urine cultures negative. D/C'd IV Rocephin. Follow for now.  #8 Bacteruria Urine cultures negative. D/C'd IV Rocephin.  #9 hypertension Increase Coreg and cont Norvasc. Follow.  #10 iron deficiency anemia/anemia of chronic disease Anemia panel consistent with iron deficiency anemia. H&H stable.  11; depression psychiatry consultation recommended to start  Wellbutrin,  Code Status: Full  Discharge Exam: * Blood pressure 150/56, pulse 77, temperature 98.2 F (36.8 C), temperature source Oral, resp. rate 17, height 5' 7" (1.702 m), weight 85.004 kg (187 lb 6.4 oz), SpO2 100 %. OJJ:KKXFG and alert CVS:RRR Resp: few basilar crackles Abd:+ BS NTND Ext: 1-2+ edema NEURO:CNI Ox3 no asterixis        Discharge Instructions    Diet - low sodium heart healthy    Complete by:  As directed      Increase activity slowly    Complete by:  As directed            Follow-up Information    Please follow up.   Why:  Veda Canning, [place], [place], [place]  Appointment Wed, Mar 13, 2014 @ 10am. Phone number [phone]      Please follow up.   Why:  Appointment for hospital followup: [place] [address], [place], [place] Phone # [phone] Jerel Shepherd 10, 2016 at 10am      Follow up with [facility], MD. Schedule an appointment as soon as possible for a visit in 3 days.   Specialty:  Nephrology   Contact information:   [address] [address] [phone]       Signed: Reyne Dumas 03/15/2014, 1:19 PM

## 2015-05-05 ENCOUNTER — Other Ambulatory Visit: Payer: Self-pay | Admitting: Obstetrics and Gynecology

## 2015-05-05 DIAGNOSIS — Z1231 Encounter for screening mammogram for malignant neoplasm of breast: Secondary | ICD-10-CM

## 2015-05-29 ENCOUNTER — Ambulatory Visit
Admission: RE | Admit: 2015-05-29 | Discharge: 2015-05-29 | Disposition: A | Discharge status: Home / Self Care | Payer: Self-pay | Source: Ambulatory Visit | Attending: Obstetrics and Gynecology | Admitting: Obstetrics and Gynecology

## 2015-05-29 DIAGNOSIS — Z1231 Encounter for screening mammogram for malignant neoplasm of breast: Secondary | ICD-10-CM

## 2017-04-20 ENCOUNTER — Other Ambulatory Visit: Payer: Self-pay | Admitting: Family Medicine

## 2017-04-20 DIAGNOSIS — Z1239 Encounter for other screening for malignant neoplasm of breast: Secondary | ICD-10-CM

## 2017-07-26 ENCOUNTER — Ambulatory Visit
Admission: RE | Admit: 2017-07-26 | Discharge: 2017-07-26 | Disposition: A | Discharge status: Home / Self Care | Payer: Medicare Other | Source: Ambulatory Visit | Attending: Family Medicine | Admitting: Family Medicine

## 2017-07-26 DIAGNOSIS — Z1231 Encounter for screening mammogram for malignant neoplasm of breast: Secondary | ICD-10-CM | POA: Insufficient documentation

## 2017-07-26 DIAGNOSIS — Z1239 Encounter for other screening for malignant neoplasm of breast: Secondary | ICD-10-CM

## 2017-08-08 ENCOUNTER — Other Ambulatory Visit: Payer: Self-pay | Admitting: Obstetrics and Gynecology

## 2017-08-08 ENCOUNTER — Ambulatory Visit
Admission: RE | Admit: 2017-08-08 | Discharge: 2017-08-08 | Disposition: A | Discharge status: Home / Self Care | Payer: Medicare Other | Source: Ambulatory Visit | Attending: Obstetrics and Gynecology | Admitting: Obstetrics and Gynecology

## 2017-08-08 DIAGNOSIS — R928 Other abnormal and inconclusive findings on diagnostic imaging of breast: Secondary | ICD-10-CM | POA: Diagnosis present

## 2019-05-24 ENCOUNTER — Other Ambulatory Visit: Payer: Self-pay | Admitting: Obstetrics and Gynecology

## 2019-05-24 DIAGNOSIS — Z1231 Encounter for screening mammogram for malignant neoplasm of breast: Secondary | ICD-10-CM

## 2019-06-27 ENCOUNTER — Ambulatory Visit
Admission: RE | Admit: 2019-06-27 | Discharge: 2019-06-27 | Disposition: A | Discharge status: Home / Self Care | Payer: Medicare Other | Source: Ambulatory Visit | Attending: Obstetrics and Gynecology | Admitting: Obstetrics and Gynecology

## 2019-06-27 DIAGNOSIS — Z1231 Encounter for screening mammogram for malignant neoplasm of breast: Secondary | ICD-10-CM | POA: Insufficient documentation

## 2019-07-04 ENCOUNTER — Other Ambulatory Visit: Payer: Self-pay | Admitting: Family Medicine

## 2019-07-04 ENCOUNTER — Other Ambulatory Visit: Payer: Self-pay | Admitting: Obstetrics and Gynecology

## 2019-07-04 DIAGNOSIS — N6489 Other specified disorders of breast: Secondary | ICD-10-CM

## 2019-07-04 DIAGNOSIS — R928 Other abnormal and inconclusive findings on diagnostic imaging of breast: Secondary | ICD-10-CM

## 2019-07-11 ENCOUNTER — Ambulatory Visit
Admission: RE | Admit: 2019-07-11 | Discharge: 2019-07-11 | Disposition: A | Discharge status: Home / Self Care | Payer: Medicare Other | Source: Ambulatory Visit | Attending: Obstetrics and Gynecology | Admitting: Obstetrics and Gynecology

## 2019-07-11 DIAGNOSIS — R928 Other abnormal and inconclusive findings on diagnostic imaging of breast: Secondary | ICD-10-CM

## 2019-07-11 DIAGNOSIS — N6489 Other specified disorders of breast: Secondary | ICD-10-CM | POA: Diagnosis present

## 2020-06-09 ENCOUNTER — Other Ambulatory Visit: Payer: Self-pay | Admitting: Obstetrics and Gynecology

## 2020-06-09 DIAGNOSIS — Z1231 Encounter for screening mammogram for malignant neoplasm of breast: Secondary | ICD-10-CM

## 2020-10-06 IMAGING — MG MM DIGITAL DIAGNOSTIC UNILAT*R* W/ TOMO W/ CAD
4 series · 4 of 12 positions shown · non-contrast
Comparison: Prior films

CLINICAL DATA: Callback from screening mammogram for possible
asymmetry right breast

EXAM:
DIGITAL DIAGNOSTIC right MAMMOGRAM WITH CAD AND TOMO
ULTRASOUND right BREAST

[R ML synth-2D]
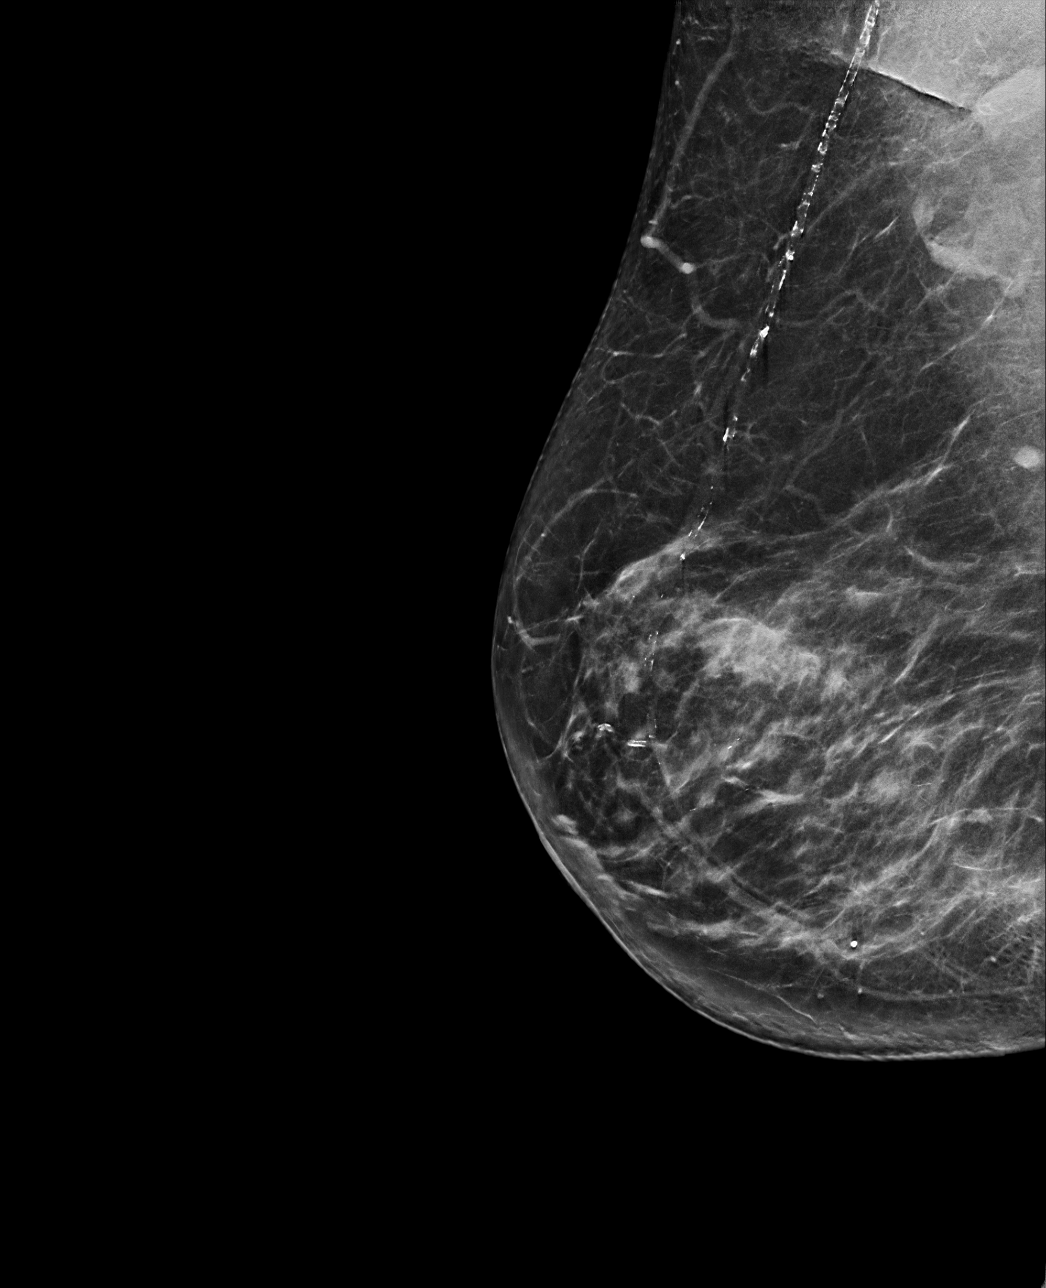

[R MLO synth-2D]
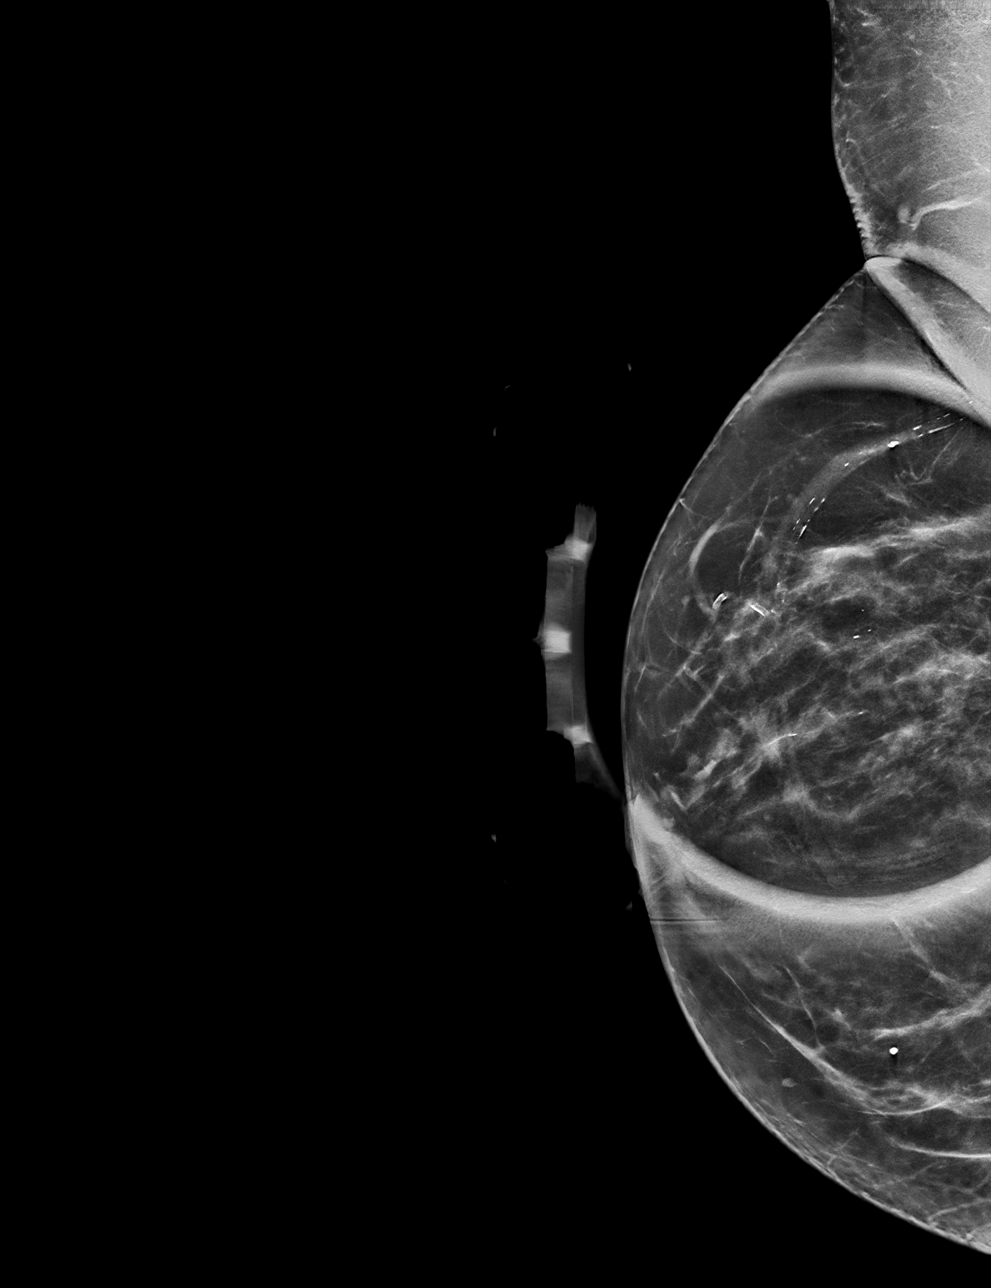

[R MLO tomo · tomo slice 33/64.0]
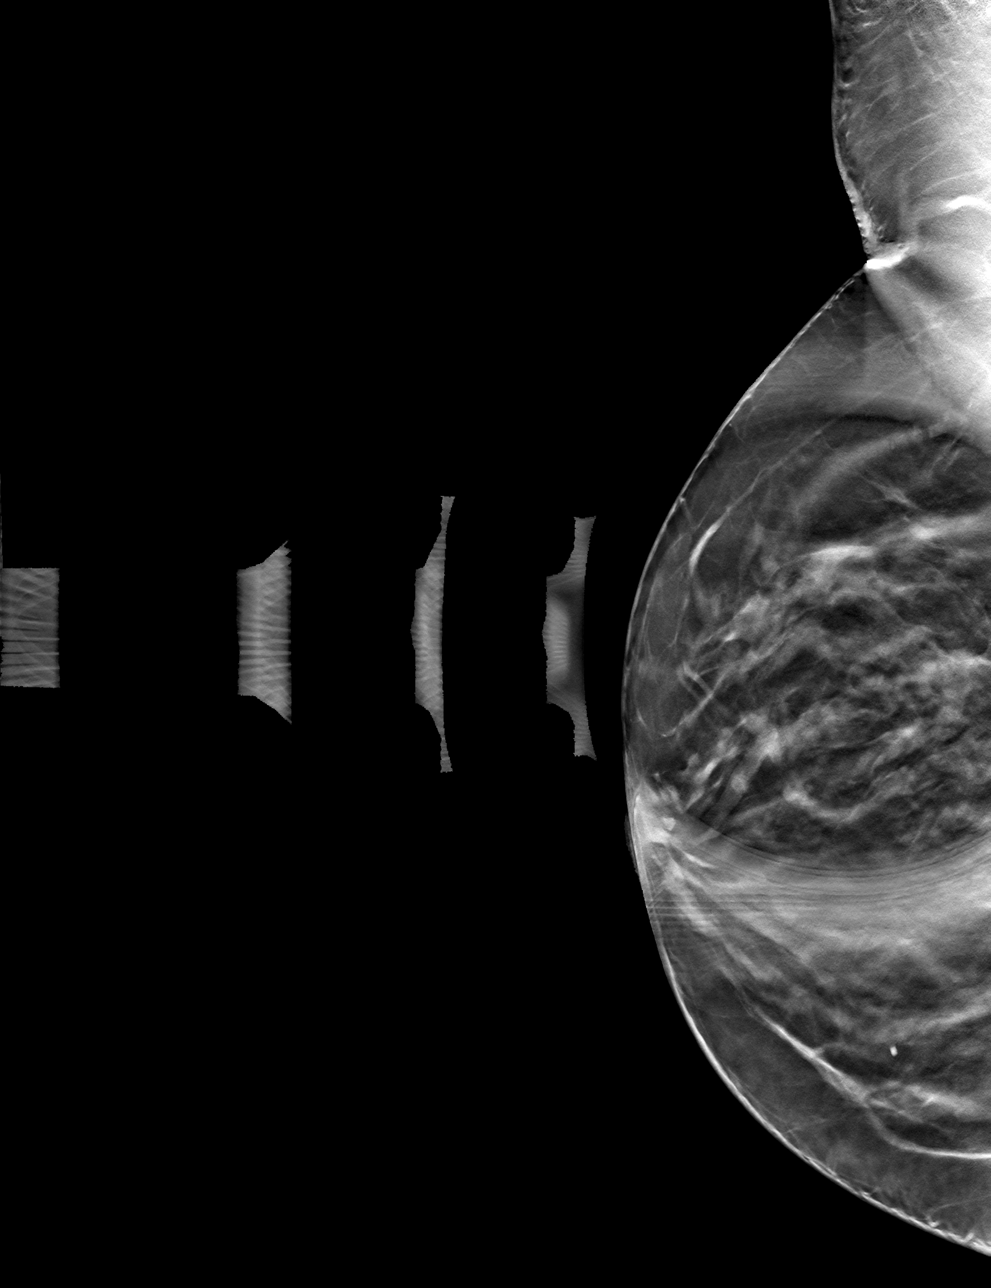

[R ML tomo · tomo slice 41/82.0]
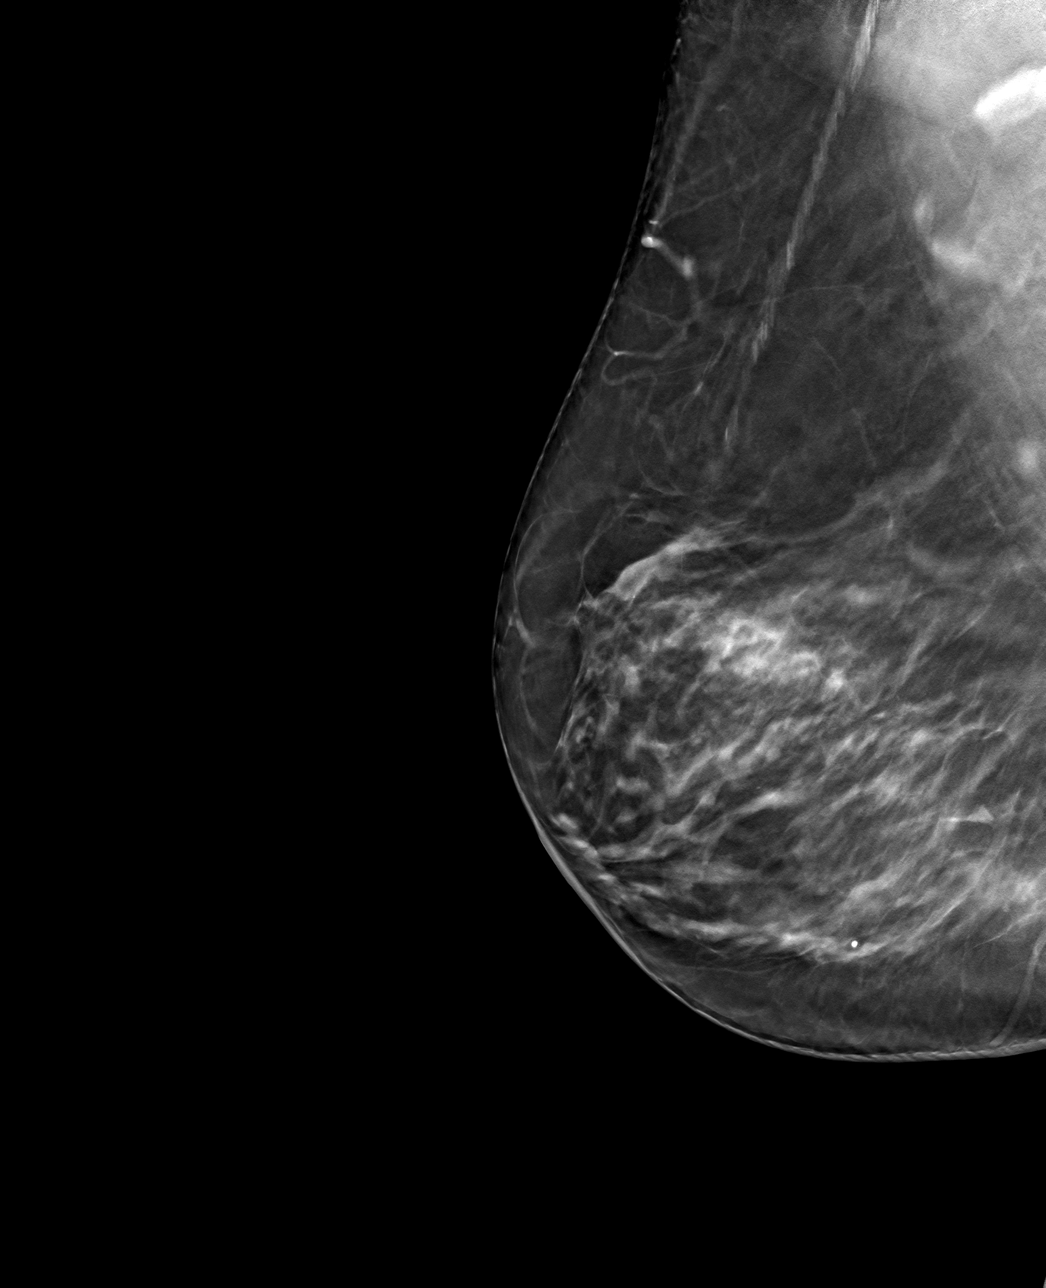

[4 of 12 positions shown; findings below may reference images not displayed]

ACR Breast Density Category c: The breast tissue is heterogeneously
dense, which may obscure small masses.
FINDINGS: Lateral view right breast, spot compression right MLO view are
submitted. The previously questioned asymmetry does not persist on
additional views. The breast parenchyma is unchanged on the lateral
view compared to prior exam of 2357.

Mammographic images were processed with CAD.

Targeted ultrasound is performed, showing no focal abnormal discrete
cystic or solid lesion in the upper right breast.
IMPRESSION: Benign findings.

RECOMMENDATION:
Routine screening mammogram back on schedule.

I have discussed the findings and recommendations with the patient.
If applicable, a reminder letter will be sent to the patient
regarding the next appointment.

BI-RADS CATEGORY  2: Benign.
